# Patient Record
Sex: Male | Born: 1945
Health system: Southern US, Community
[De-identification: ages and names within clinical notes are randomized; demographics above are authoritative.]

## PROBLEM LIST (undated history)

## (undated) DIAGNOSIS — I714 Abdominal aortic aneurysm, without rupture, unspecified: Secondary | ICD-10-CM

## (undated) DIAGNOSIS — I10 Essential (primary) hypertension: Secondary | ICD-10-CM

## (undated) DIAGNOSIS — J449 Chronic obstructive pulmonary disease, unspecified: Secondary | ICD-10-CM

## (undated) DIAGNOSIS — I639 Cerebral infarction, unspecified: Secondary | ICD-10-CM

## (undated) DIAGNOSIS — R42 Dizziness and giddiness: Secondary | ICD-10-CM

## (undated) DIAGNOSIS — G20A1 Parkinson's disease without dyskinesia, without mention of fluctuations: Secondary | ICD-10-CM

## (undated) DIAGNOSIS — IMO0001 Reserved for inherently not codable concepts without codable children: Secondary | ICD-10-CM

## (undated) DIAGNOSIS — C61 Malignant neoplasm of prostate: Secondary | ICD-10-CM

## (undated) DIAGNOSIS — C801 Malignant (primary) neoplasm, unspecified: Secondary | ICD-10-CM

## (undated) DIAGNOSIS — S4291XA Fracture of right shoulder girdle, part unspecified, initial encounter for closed fracture: Secondary | ICD-10-CM

## (undated) DIAGNOSIS — K219 Gastro-esophageal reflux disease without esophagitis: Secondary | ICD-10-CM

## (undated) DIAGNOSIS — F329 Major depressive disorder, single episode, unspecified: Secondary | ICD-10-CM

## (undated) DIAGNOSIS — I519 Heart disease, unspecified: Secondary | ICD-10-CM

## (undated) DIAGNOSIS — F32A Depression, unspecified: Secondary | ICD-10-CM

## (undated) DIAGNOSIS — R079 Chest pain, unspecified: Secondary | ICD-10-CM

## (undated) DIAGNOSIS — I82419 Acute embolism and thrombosis of unspecified femoral vein: Secondary | ICD-10-CM

## (undated) DIAGNOSIS — E785 Hyperlipidemia, unspecified: Secondary | ICD-10-CM

## (undated) DIAGNOSIS — G20 Parkinson's disease: Secondary | ICD-10-CM

## (undated) HISTORY — DX: Chronic obstructive pulmonary disease, unspecified: J44.9

## (undated) HISTORY — DX: Parkinson's disease without dyskinesia, without mention of fluctuations: G20.A1

## (undated) HISTORY — DX: Chest pain, unspecified: R07.9

## (undated) HISTORY — DX: Parkinson's disease: G20

## (undated) HISTORY — DX: Essential (primary) hypertension: I10

## (undated) HISTORY — DX: Abdominal aortic aneurysm, without rupture, unspecified: I71.40

## (undated) HISTORY — DX: Fracture of right shoulder girdle, part unspecified, initial encounter for closed fracture: S42.91XA

## (undated) HISTORY — PX: FRACTURE SURGERY: SHX138

## (undated) HISTORY — DX: Heart disease, unspecified: I51.9

## (undated) HISTORY — DX: Hyperlipidemia, unspecified: E78.5

## (undated) HISTORY — DX: Abdominal aortic aneurysm, without rupture: I71.4

## (undated) HISTORY — PX: INNER EAR SURGERY: SHX679

## (undated) HISTORY — PX: MANDIBLE SURGERY: SHX707

## (undated) SURGERY — Surgical Case
Anesthesia: *Unknown

---

## 2006-05-02 ENCOUNTER — Ambulatory Visit: Payer: Self-pay | Admitting: Otolaryngology

## 2006-07-05 ENCOUNTER — Other Ambulatory Visit: Payer: Self-pay

## 2006-07-13 ENCOUNTER — Ambulatory Visit: Payer: Self-pay | Admitting: Otolaryngology

## 2006-12-05 ENCOUNTER — Ambulatory Visit: Payer: Self-pay | Admitting: Unknown Physician Specialty

## 2008-08-02 HISTORY — PX: COLONOSCOPY: SHX5424

## 2008-08-06 ENCOUNTER — Ambulatory Visit: Payer: Self-pay | Admitting: Gastroenterology

## 2010-05-25 ENCOUNTER — Ambulatory Visit: Payer: Self-pay | Admitting: Family Medicine

## 2010-12-25 ENCOUNTER — Ambulatory Visit: Payer: Self-pay | Admitting: Otolaryngology

## 2011-02-09 ENCOUNTER — Ambulatory Visit: Payer: Self-pay | Admitting: Otolaryngology

## 2011-02-15 ENCOUNTER — Ambulatory Visit: Payer: Self-pay | Admitting: Otolaryngology

## 2011-02-16 LAB — PATHOLOGY REPORT

## 2011-08-02 ENCOUNTER — Ambulatory Visit: Payer: Self-pay | Admitting: Family Medicine

## 2012-01-19 DIAGNOSIS — H40009 Preglaucoma, unspecified, unspecified eye: Secondary | ICD-10-CM | POA: Diagnosis not present

## 2012-02-09 DIAGNOSIS — K219 Gastro-esophageal reflux disease without esophagitis: Secondary | ICD-10-CM | POA: Diagnosis not present

## 2012-02-09 DIAGNOSIS — I10 Essential (primary) hypertension: Secondary | ICD-10-CM | POA: Diagnosis not present

## 2012-02-09 DIAGNOSIS — J449 Chronic obstructive pulmonary disease, unspecified: Secondary | ICD-10-CM | POA: Diagnosis not present

## 2012-02-09 DIAGNOSIS — E785 Hyperlipidemia, unspecified: Secondary | ICD-10-CM | POA: Diagnosis not present

## 2012-02-09 DIAGNOSIS — H40009 Preglaucoma, unspecified, unspecified eye: Secondary | ICD-10-CM | POA: Diagnosis not present

## 2012-03-10 DIAGNOSIS — H4010X0 Unspecified open-angle glaucoma, stage unspecified: Secondary | ICD-10-CM | POA: Diagnosis not present

## 2012-03-14 DIAGNOSIS — E785 Hyperlipidemia, unspecified: Secondary | ICD-10-CM | POA: Diagnosis not present

## 2012-07-12 DIAGNOSIS — H6120 Impacted cerumen, unspecified ear: Secondary | ICD-10-CM | POA: Diagnosis not present

## 2012-07-12 DIAGNOSIS — H60509 Unspecified acute noninfective otitis externa, unspecified ear: Secondary | ICD-10-CM | POA: Diagnosis not present

## 2012-07-12 DIAGNOSIS — H902 Conductive hearing loss, unspecified: Secondary | ICD-10-CM | POA: Diagnosis not present

## 2012-07-12 DIAGNOSIS — H906 Mixed conductive and sensorineural hearing loss, bilateral: Secondary | ICD-10-CM | POA: Diagnosis not present

## 2012-07-12 DIAGNOSIS — H6980 Other specified disorders of Eustachian tube, unspecified ear: Secondary | ICD-10-CM | POA: Diagnosis not present

## 2012-08-07 DIAGNOSIS — H6980 Other specified disorders of Eustachian tube, unspecified ear: Secondary | ICD-10-CM | POA: Diagnosis not present

## 2012-08-07 DIAGNOSIS — H908 Mixed conductive and sensorineural hearing loss, unspecified: Secondary | ICD-10-CM | POA: Diagnosis not present

## 2012-08-07 DIAGNOSIS — H905 Unspecified sensorineural hearing loss: Secondary | ICD-10-CM | POA: Diagnosis not present

## 2012-09-18 DIAGNOSIS — H6120 Impacted cerumen, unspecified ear: Secondary | ICD-10-CM | POA: Diagnosis not present

## 2012-09-18 DIAGNOSIS — H6980 Other specified disorders of Eustachian tube, unspecified ear: Secondary | ICD-10-CM | POA: Diagnosis not present

## 2012-09-18 DIAGNOSIS — H908 Mixed conductive and sensorineural hearing loss, unspecified: Secondary | ICD-10-CM | POA: Diagnosis not present

## 2012-09-25 DIAGNOSIS — H353190 Nonexudative age-related macular degeneration, unspecified eye, stage unspecified: Secondary | ICD-10-CM | POA: Diagnosis not present

## 2012-10-13 DIAGNOSIS — E785 Hyperlipidemia, unspecified: Secondary | ICD-10-CM | POA: Diagnosis not present

## 2012-10-13 DIAGNOSIS — K219 Gastro-esophageal reflux disease without esophagitis: Secondary | ICD-10-CM | POA: Diagnosis not present

## 2012-10-13 DIAGNOSIS — I10 Essential (primary) hypertension: Secondary | ICD-10-CM | POA: Diagnosis not present

## 2013-03-26 DIAGNOSIS — H4010X0 Unspecified open-angle glaucoma, stage unspecified: Secondary | ICD-10-CM | POA: Diagnosis not present

## 2013-05-11 DIAGNOSIS — J449 Chronic obstructive pulmonary disease, unspecified: Secondary | ICD-10-CM | POA: Diagnosis not present

## 2013-05-11 DIAGNOSIS — Z23 Encounter for immunization: Secondary | ICD-10-CM | POA: Diagnosis not present

## 2013-05-11 DIAGNOSIS — K219 Gastro-esophageal reflux disease without esophagitis: Secondary | ICD-10-CM | POA: Diagnosis not present

## 2013-05-11 DIAGNOSIS — E785 Hyperlipidemia, unspecified: Secondary | ICD-10-CM | POA: Diagnosis not present

## 2013-05-11 DIAGNOSIS — I10 Essential (primary) hypertension: Secondary | ICD-10-CM | POA: Diagnosis not present

## 2013-09-24 DIAGNOSIS — H353190 Nonexudative age-related macular degeneration, unspecified eye, stage unspecified: Secondary | ICD-10-CM | POA: Diagnosis not present

## 2013-10-08 DIAGNOSIS — J329 Chronic sinusitis, unspecified: Secondary | ICD-10-CM | POA: Diagnosis not present

## 2013-10-08 DIAGNOSIS — R32 Unspecified urinary incontinence: Secondary | ICD-10-CM | POA: Diagnosis not present

## 2013-10-08 DIAGNOSIS — J449 Chronic obstructive pulmonary disease, unspecified: Secondary | ICD-10-CM | POA: Diagnosis not present

## 2013-10-10 ENCOUNTER — Emergency Department: Payer: Self-pay | Admitting: Emergency Medicine

## 2013-10-10 DIAGNOSIS — Z79899 Other long term (current) drug therapy: Secondary | ICD-10-CM | POA: Diagnosis not present

## 2013-10-10 DIAGNOSIS — J189 Pneumonia, unspecified organism: Secondary | ICD-10-CM | POA: Diagnosis not present

## 2013-10-10 DIAGNOSIS — F17200 Nicotine dependence, unspecified, uncomplicated: Secondary | ICD-10-CM | POA: Diagnosis not present

## 2013-10-10 DIAGNOSIS — J40 Bronchitis, not specified as acute or chronic: Secondary | ICD-10-CM | POA: Diagnosis not present

## 2013-10-10 DIAGNOSIS — J209 Acute bronchitis, unspecified: Secondary | ICD-10-CM | POA: Diagnosis not present

## 2013-10-10 DIAGNOSIS — J449 Chronic obstructive pulmonary disease, unspecified: Secondary | ICD-10-CM | POA: Diagnosis not present

## 2013-10-10 DIAGNOSIS — E860 Dehydration: Secondary | ICD-10-CM | POA: Diagnosis not present

## 2013-10-10 DIAGNOSIS — J984 Other disorders of lung: Secondary | ICD-10-CM | POA: Diagnosis not present

## 2013-10-10 DIAGNOSIS — I10 Essential (primary) hypertension: Secondary | ICD-10-CM | POA: Diagnosis not present

## 2013-10-10 DIAGNOSIS — R0602 Shortness of breath: Secondary | ICD-10-CM | POA: Diagnosis not present

## 2013-10-10 DIAGNOSIS — E785 Hyperlipidemia, unspecified: Secondary | ICD-10-CM | POA: Diagnosis not present

## 2013-10-10 LAB — CBC
HCT: 47.4 % (ref 40.0–52.0)
HGB: 16 g/dL (ref 13.0–18.0)
MCH: 30.6 pg (ref 26.0–34.0)
MCHC: 33.6 g/dL (ref 32.0–36.0)
MCV: 91 fL (ref 80–100)
Platelet: 150 10*3/uL (ref 150–440)
RBC: 5.22 10*6/uL (ref 4.40–5.90)
RDW: 14.4 % (ref 11.5–14.5)
WBC: 4.4 10*3/uL (ref 3.8–10.6)

## 2013-10-10 LAB — BASIC METABOLIC PANEL
Anion Gap: 4 — ABNORMAL LOW (ref 7–16)
BUN: 27 mg/dL — ABNORMAL HIGH (ref 7–18)
Calcium, Total: 9.3 mg/dL (ref 8.5–10.1)
Chloride: 100 mmol/L (ref 98–107)
Co2: 29 mmol/L (ref 21–32)
Creatinine: 1.23 mg/dL (ref 0.60–1.30)
EGFR (African American): >60
EGFR (Non-African Amer.): 60 — ABNORMAL LOW
Glucose: 99 mg/dL (ref 65–99)
Osmolality: 272 (ref 275–301)
Potassium: 4.2 mmol/L (ref 3.5–5.1)
Sodium: 133 mmol/L — ABNORMAL LOW (ref 136–145)

## 2013-10-10 LAB — TROPONIN I: Troponin-I: <0.02 ng/mL

## 2013-10-12 DIAGNOSIS — J189 Pneumonia, unspecified organism: Secondary | ICD-10-CM | POA: Diagnosis not present

## 2013-10-12 DIAGNOSIS — J449 Chronic obstructive pulmonary disease, unspecified: Secondary | ICD-10-CM | POA: Diagnosis not present

## 2013-10-12 DIAGNOSIS — E860 Dehydration: Secondary | ICD-10-CM | POA: Diagnosis not present

## 2013-10-17 ENCOUNTER — Observation Stay: Payer: Self-pay | Admitting: Specialist

## 2013-10-17 DIAGNOSIS — Z800 Family history of malignant neoplasm of digestive organs: Secondary | ICD-10-CM | POA: Diagnosis not present

## 2013-10-17 DIAGNOSIS — J398 Other specified diseases of upper respiratory tract: Secondary | ICD-10-CM | POA: Diagnosis not present

## 2013-10-17 DIAGNOSIS — J440 Chronic obstructive pulmonary disease with acute lower respiratory infection: Secondary | ICD-10-CM | POA: Diagnosis not present

## 2013-10-17 DIAGNOSIS — I10 Essential (primary) hypertension: Secondary | ICD-10-CM | POA: Diagnosis not present

## 2013-10-17 DIAGNOSIS — Z8051 Family history of malignant neoplasm of kidney: Secondary | ICD-10-CM | POA: Diagnosis not present

## 2013-10-17 DIAGNOSIS — J441 Chronic obstructive pulmonary disease with (acute) exacerbation: Secondary | ICD-10-CM | POA: Diagnosis not present

## 2013-10-17 DIAGNOSIS — F17200 Nicotine dependence, unspecified, uncomplicated: Secondary | ICD-10-CM | POA: Diagnosis not present

## 2013-10-17 DIAGNOSIS — R062 Wheezing: Secondary | ICD-10-CM | POA: Diagnosis not present

## 2013-10-17 DIAGNOSIS — K219 Gastro-esophageal reflux disease without esophagitis: Secondary | ICD-10-CM | POA: Diagnosis not present

## 2013-10-17 DIAGNOSIS — E785 Hyperlipidemia, unspecified: Secondary | ICD-10-CM | POA: Diagnosis not present

## 2013-10-17 DIAGNOSIS — H409 Unspecified glaucoma: Secondary | ICD-10-CM | POA: Diagnosis not present

## 2013-10-17 DIAGNOSIS — R0602 Shortness of breath: Secondary | ICD-10-CM | POA: Diagnosis not present

## 2013-10-17 DIAGNOSIS — I517 Cardiomegaly: Secondary | ICD-10-CM | POA: Diagnosis not present

## 2013-10-17 DIAGNOSIS — J9600 Acute respiratory failure, unspecified whether with hypoxia or hypercapnia: Secondary | ICD-10-CM | POA: Diagnosis not present

## 2013-10-17 DIAGNOSIS — H9190 Unspecified hearing loss, unspecified ear: Secondary | ICD-10-CM | POA: Diagnosis not present

## 2013-10-17 DIAGNOSIS — Z79899 Other long term (current) drug therapy: Secondary | ICD-10-CM | POA: Diagnosis not present

## 2013-10-17 DIAGNOSIS — J988 Other specified respiratory disorders: Secondary | ICD-10-CM | POA: Diagnosis not present

## 2013-10-17 LAB — CBC
HCT: 50.3 % (ref 40.0–52.0)
HGB: 16.8 g/dL (ref 13.0–18.0)
MCH: 29.8 pg (ref 26.0–34.0)
MCHC: 33.3 g/dL (ref 32.0–36.0)
MCV: 90 fL (ref 80–100)
Platelet: 185 10*3/uL (ref 150–440)
RBC: 5.62 10*6/uL (ref 4.40–5.90)
RDW: 13.8 % (ref 11.5–14.5)
WBC: 7.9 10*3/uL (ref 3.8–10.6)

## 2013-10-17 LAB — BASIC METABOLIC PANEL
Anion Gap: 3 — ABNORMAL LOW (ref 7–16)
BUN: 9 mg/dL (ref 7–18)
Calcium, Total: 8.8 mg/dL (ref 8.5–10.1)
Chloride: 103 mmol/L (ref 98–107)
Co2: 30 mmol/L (ref 21–32)
Creatinine: 0.95 mg/dL (ref 0.60–1.30)
EGFR (African American): >60
EGFR (Non-African Amer.): >60
Glucose: 102 mg/dL — ABNORMAL HIGH (ref 65–99)
Osmolality: 271 (ref 275–301)
Potassium: 4.3 mmol/L (ref 3.5–5.1)
Sodium: 136 mmol/L (ref 136–145)

## 2013-10-17 LAB — TROPONIN I: Troponin-I: <0.02 ng/mL

## 2013-10-18 DIAGNOSIS — K219 Gastro-esophageal reflux disease without esophagitis: Secondary | ICD-10-CM | POA: Diagnosis not present

## 2013-10-18 DIAGNOSIS — F17200 Nicotine dependence, unspecified, uncomplicated: Secondary | ICD-10-CM | POA: Diagnosis not present

## 2013-10-18 DIAGNOSIS — I517 Cardiomegaly: Secondary | ICD-10-CM | POA: Diagnosis not present

## 2013-10-18 DIAGNOSIS — J449 Chronic obstructive pulmonary disease, unspecified: Secondary | ICD-10-CM | POA: Diagnosis not present

## 2013-10-18 DIAGNOSIS — I10 Essential (primary) hypertension: Secondary | ICD-10-CM | POA: Diagnosis not present

## 2013-10-18 LAB — CBC WITH DIFFERENTIAL/PLATELET
Basophil #: 0 10*3/uL (ref 0.0–0.1)
Basophil %: 0.2 %
Eosinophil #: 0 10*3/uL (ref 0.0–0.7)
Eosinophil %: 0 %
HCT: 43.5 % (ref 40.0–52.0)
HGB: 14 g/dL (ref 13.0–18.0)
Lymphocyte #: 0.7 10*3/uL — ABNORMAL LOW (ref 1.0–3.6)
Lymphocyte %: 15.5 %
MCH: 28.5 pg (ref 26.0–34.0)
MCHC: 32.1 g/dL (ref 32.0–36.0)
MCV: 89 fL (ref 80–100)
Monocyte #: 0.1 x10 3/mm — ABNORMAL LOW (ref 0.2–1.0)
Monocyte %: 2.5 %
Neutrophil #: 3.9 10*3/uL (ref 1.4–6.5)
Neutrophil %: 81.8 %
Platelet: 186 10*3/uL (ref 150–440)
RBC: 4.9 10*6/uL (ref 4.40–5.90)
RDW: 14 % (ref 11.5–14.5)
WBC: 4.8 10*3/uL (ref 3.8–10.6)

## 2013-10-18 LAB — BASIC METABOLIC PANEL
Anion Gap: 5 — ABNORMAL LOW (ref 7–16)
BUN: 16 mg/dL (ref 7–18)
Calcium, Total: 8.5 mg/dL (ref 8.5–10.1)
Chloride: 107 mmol/L (ref 98–107)
Co2: 27 mmol/L (ref 21–32)
Creatinine: 0.88 mg/dL (ref 0.60–1.30)
EGFR (African American): >60
EGFR (Non-African Amer.): >60
Glucose: 159 mg/dL — ABNORMAL HIGH (ref 65–99)
Osmolality: 282 (ref 275–301)
Potassium: 4.4 mmol/L (ref 3.5–5.1)
Sodium: 139 mmol/L (ref 136–145)

## 2013-10-18 LAB — LIPID PANEL
Cholesterol: 77 mg/dL (ref 0–200)
HDL Cholesterol: 39 mg/dL — ABNORMAL LOW (ref 40–60)
Ldl Cholesterol, Calc: 26 mg/dL (ref 0–100)
Triglycerides: 59 mg/dL (ref 0–200)
VLDL Cholesterol, Calc: 12 mg/dL (ref 5–40)

## 2013-10-19 DIAGNOSIS — K219 Gastro-esophageal reflux disease without esophagitis: Secondary | ICD-10-CM | POA: Diagnosis not present

## 2013-10-19 DIAGNOSIS — F17200 Nicotine dependence, unspecified, uncomplicated: Secondary | ICD-10-CM | POA: Diagnosis not present

## 2013-10-19 DIAGNOSIS — J449 Chronic obstructive pulmonary disease, unspecified: Secondary | ICD-10-CM | POA: Diagnosis not present

## 2013-10-19 DIAGNOSIS — I10 Essential (primary) hypertension: Secondary | ICD-10-CM | POA: Diagnosis not present

## 2013-11-16 DIAGNOSIS — J189 Pneumonia, unspecified organism: Secondary | ICD-10-CM | POA: Diagnosis not present

## 2013-11-16 DIAGNOSIS — J449 Chronic obstructive pulmonary disease, unspecified: Secondary | ICD-10-CM | POA: Diagnosis not present

## 2014-02-27 DIAGNOSIS — I10 Essential (primary) hypertension: Secondary | ICD-10-CM | POA: Diagnosis not present

## 2014-02-27 DIAGNOSIS — E785 Hyperlipidemia, unspecified: Secondary | ICD-10-CM | POA: Diagnosis not present

## 2014-02-27 DIAGNOSIS — K219 Gastro-esophageal reflux disease without esophagitis: Secondary | ICD-10-CM | POA: Diagnosis not present

## 2014-02-27 DIAGNOSIS — J449 Chronic obstructive pulmonary disease, unspecified: Secondary | ICD-10-CM | POA: Diagnosis not present

## 2014-03-05 DIAGNOSIS — I10 Essential (primary) hypertension: Secondary | ICD-10-CM | POA: Diagnosis not present

## 2014-03-05 DIAGNOSIS — E785 Hyperlipidemia, unspecified: Secondary | ICD-10-CM | POA: Diagnosis not present

## 2014-03-25 DIAGNOSIS — H4010X0 Unspecified open-angle glaucoma, stage unspecified: Secondary | ICD-10-CM | POA: Diagnosis not present

## 2014-05-30 DIAGNOSIS — Z23 Encounter for immunization: Secondary | ICD-10-CM | POA: Diagnosis not present

## 2014-07-27 ENCOUNTER — Emergency Department: Payer: Self-pay | Admitting: Emergency Medicine

## 2014-07-27 DIAGNOSIS — S42291A Other displaced fracture of upper end of right humerus, initial encounter for closed fracture: Secondary | ICD-10-CM | POA: Diagnosis not present

## 2014-07-27 DIAGNOSIS — R079 Chest pain, unspecified: Secondary | ICD-10-CM | POA: Diagnosis not present

## 2014-07-27 DIAGNOSIS — M25512 Pain in left shoulder: Secondary | ICD-10-CM | POA: Diagnosis not present

## 2014-07-27 DIAGNOSIS — S299XXA Unspecified injury of thorax, initial encounter: Secondary | ICD-10-CM | POA: Diagnosis not present

## 2014-07-27 DIAGNOSIS — S42214A Unspecified nondisplaced fracture of surgical neck of right humerus, initial encounter for closed fracture: Secondary | ICD-10-CM | POA: Diagnosis not present

## 2014-07-27 DIAGNOSIS — S0093XA Contusion of unspecified part of head, initial encounter: Secondary | ICD-10-CM | POA: Diagnosis not present

## 2014-07-27 DIAGNOSIS — Z87891 Personal history of nicotine dependence: Secondary | ICD-10-CM | POA: Diagnosis not present

## 2014-07-27 DIAGNOSIS — S60511A Abrasion of right hand, initial encounter: Secondary | ICD-10-CM | POA: Diagnosis not present

## 2014-07-27 DIAGNOSIS — S0990XA Unspecified injury of head, initial encounter: Secondary | ICD-10-CM | POA: Diagnosis not present

## 2014-07-27 DIAGNOSIS — Z23 Encounter for immunization: Secondary | ICD-10-CM | POA: Diagnosis not present

## 2014-07-27 DIAGNOSIS — S81811A Laceration without foreign body, right lower leg, initial encounter: Secondary | ICD-10-CM | POA: Diagnosis not present

## 2014-07-27 DIAGNOSIS — S0083XA Contusion of other part of head, initial encounter: Secondary | ICD-10-CM | POA: Diagnosis not present

## 2014-07-27 DIAGNOSIS — S40011A Contusion of right shoulder, initial encounter: Secondary | ICD-10-CM | POA: Diagnosis not present

## 2014-07-27 DIAGNOSIS — R9431 Abnormal electrocardiogram [ECG] [EKG]: Secondary | ICD-10-CM | POA: Diagnosis not present

## 2014-07-27 DIAGNOSIS — I10 Essential (primary) hypertension: Secondary | ICD-10-CM | POA: Diagnosis not present

## 2014-07-27 DIAGNOSIS — W19XXXA Unspecified fall, initial encounter: Secondary | ICD-10-CM | POA: Diagnosis not present

## 2014-07-27 DIAGNOSIS — S80811A Abrasion, right lower leg, initial encounter: Secondary | ICD-10-CM | POA: Diagnosis not present

## 2014-07-27 DIAGNOSIS — S42211A Unspecified displaced fracture of surgical neck of right humerus, initial encounter for closed fracture: Secondary | ICD-10-CM | POA: Diagnosis not present

## 2014-07-27 LAB — BASIC METABOLIC PANEL
Anion Gap: 7 (ref 7–16)
BUN: 14 mg/dL (ref 7–18)
Calcium, Total: 8.6 mg/dL (ref 8.5–10.1)
Chloride: 106 mmol/L (ref 98–107)
Co2: 27 mmol/L (ref 21–32)
Creatinine: 1.13 mg/dL (ref 0.60–1.30)
EGFR (African American): >60
EGFR (Non-African Amer.): >60
Glucose: 137 mg/dL — ABNORMAL HIGH (ref 65–99)
Osmolality: 282 (ref 275–301)
Potassium: 4.5 mmol/L (ref 3.5–5.1)
Sodium: 140 mmol/L (ref 136–145)

## 2014-07-27 LAB — CBC
HCT: 45.9 % (ref 40.0–52.0)
HGB: 15.2 g/dL (ref 13.0–18.0)
MCH: 29.8 pg (ref 26.0–34.0)
MCHC: 33.1 g/dL (ref 32.0–36.0)
MCV: 90 fL (ref 80–100)
Platelet: 249 10*3/uL (ref 150–440)
RBC: 5.09 10*6/uL (ref 4.40–5.90)
RDW: 13.8 % (ref 11.5–14.5)
WBC: 9 10*3/uL (ref 3.8–10.6)

## 2014-07-30 DIAGNOSIS — S42201D Unspecified fracture of upper end of right humerus, subsequent encounter for fracture with routine healing: Secondary | ICD-10-CM | POA: Diagnosis not present

## 2014-07-30 DIAGNOSIS — I719 Aortic aneurysm of unspecified site, without rupture: Secondary | ICD-10-CM | POA: Diagnosis not present

## 2014-07-30 DIAGNOSIS — S3991XA Unspecified injury of abdomen, initial encounter: Secondary | ICD-10-CM | POA: Diagnosis not present

## 2014-07-30 DIAGNOSIS — M79601 Pain in right arm: Secondary | ICD-10-CM | POA: Diagnosis not present

## 2014-07-30 DIAGNOSIS — S79911A Unspecified injury of right hip, initial encounter: Secondary | ICD-10-CM | POA: Diagnosis not present

## 2014-07-30 DIAGNOSIS — M25551 Pain in right hip: Secondary | ICD-10-CM | POA: Diagnosis not present

## 2014-07-30 LAB — URINALYSIS, COMPLETE
Bacteria: NONE SEEN
Bilirubin,UR: NEGATIVE
Blood: NEGATIVE
Glucose,UR: NEGATIVE mg/dL (ref 0–75)
Leukocyte Esterase: NEGATIVE
Nitrite: NEGATIVE
Ph: 5 (ref 4.5–8.0)
Protein: NEGATIVE
RBC,UR: 2 /HPF (ref 0–5)
Specific Gravity: 1.058 (ref 1.003–1.030)
Squamous Epithelial: NONE SEEN
WBC UR: 1 /HPF (ref 0–5)

## 2014-07-30 LAB — CBC
HCT: 43 % (ref 40.0–52.0)
HGB: 14.3 g/dL (ref 13.0–18.0)
MCH: 29.9 pg (ref 26.0–34.0)
MCHC: 33.2 g/dL (ref 32.0–36.0)
MCV: 90 fL (ref 80–100)
Platelet: 220 10*3/uL (ref 150–440)
RBC: 4.79 10*6/uL (ref 4.40–5.90)
RDW: 13.9 % (ref 11.5–14.5)
WBC: 10.7 10*3/uL — ABNORMAL HIGH (ref 3.8–10.6)

## 2014-07-30 LAB — LIPASE, BLOOD: Lipase: 99 U/L (ref 73–393)

## 2014-07-30 LAB — COMPREHENSIVE METABOLIC PANEL
Albumin: 3.3 g/dL — ABNORMAL LOW (ref 3.4–5.0)
Alkaline Phosphatase: 49 U/L
Anion Gap: 7 (ref 7–16)
BUN: 19 mg/dL — ABNORMAL HIGH (ref 7–18)
Bilirubin,Total: 0.9 mg/dL (ref 0.2–1.0)
Calcium, Total: 8.7 mg/dL (ref 8.5–10.1)
Chloride: 103 mmol/L (ref 98–107)
Co2: 27 mmol/L (ref 21–32)
Creatinine: 1.03 mg/dL (ref 0.60–1.30)
EGFR (African American): >60
EGFR (Non-African Amer.): >60
Glucose: 147 mg/dL — ABNORMAL HIGH (ref 65–99)
Osmolality: 279 (ref 275–301)
Potassium: 5 mmol/L (ref 3.5–5.1)
SGOT(AST): 26 U/L (ref 15–37)
SGPT (ALT): 22 U/L
Sodium: 137 mmol/L (ref 136–145)
Total Protein: 7.1 g/dL (ref 6.4–8.2)

## 2014-07-30 LAB — CK: CK, Total: 625 U/L — ABNORMAL HIGH (ref 39–308)

## 2014-07-31 DIAGNOSIS — S42251A Displaced fracture of greater tuberosity of right humerus, initial encounter for closed fracture: Secondary | ICD-10-CM | POA: Diagnosis not present

## 2014-07-31 DIAGNOSIS — S42224D 2-part nondisplaced fracture of surgical neck of right humerus, subsequent encounter for fracture with routine healing: Secondary | ICD-10-CM | POA: Diagnosis not present

## 2014-07-31 DIAGNOSIS — J449 Chronic obstructive pulmonary disease, unspecified: Secondary | ICD-10-CM | POA: Diagnosis not present

## 2014-07-31 DIAGNOSIS — R52 Pain, unspecified: Secondary | ICD-10-CM | POA: Diagnosis not present

## 2014-07-31 DIAGNOSIS — K219 Gastro-esophageal reflux disease without esophagitis: Secondary | ICD-10-CM | POA: Diagnosis not present

## 2014-07-31 DIAGNOSIS — I10 Essential (primary) hypertension: Secondary | ICD-10-CM | POA: Diagnosis not present

## 2014-07-31 DIAGNOSIS — I714 Abdominal aortic aneurysm, without rupture: Secondary | ICD-10-CM | POA: Diagnosis not present

## 2014-07-31 DIAGNOSIS — S42224A 2-part nondisplaced fracture of surgical neck of right humerus, initial encounter for closed fracture: Secondary | ICD-10-CM | POA: Diagnosis not present

## 2014-08-01 DIAGNOSIS — I10 Essential (primary) hypertension: Secondary | ICD-10-CM | POA: Diagnosis not present

## 2014-08-01 DIAGNOSIS — R52 Pain, unspecified: Secondary | ICD-10-CM | POA: Diagnosis not present

## 2014-08-01 DIAGNOSIS — S42224D 2-part nondisplaced fracture of surgical neck of right humerus, subsequent encounter for fracture with routine healing: Secondary | ICD-10-CM | POA: Diagnosis not present

## 2014-08-01 DIAGNOSIS — J449 Chronic obstructive pulmonary disease, unspecified: Secondary | ICD-10-CM | POA: Diagnosis not present

## 2014-08-01 LAB — CBC WITH DIFFERENTIAL/PLATELET
Basophil #: 0 10*3/uL (ref 0.0–0.1)
Basophil %: 0.3 %
Eosinophil #: 0.1 10*3/uL (ref 0.0–0.7)
Eosinophil %: 1.8 %
HCT: 38 % — ABNORMAL LOW (ref 40.0–52.0)
HGB: 12.7 g/dL — ABNORMAL LOW (ref 13.0–18.0)
Lymphocyte #: 1.2 10*3/uL (ref 1.0–3.6)
Lymphocyte %: 15.8 %
MCH: 30.1 pg (ref 26.0–34.0)
MCHC: 33.5 g/dL (ref 32.0–36.0)
MCV: 90 fL (ref 80–100)
Monocyte #: 1 x10 3/mm (ref 0.2–1.0)
Monocyte %: 13 %
Neutrophil #: 5.5 10*3/uL (ref 1.4–6.5)
Neutrophil %: 69.1 %
Platelet: 210 10*3/uL (ref 150–440)
RBC: 4.24 10*6/uL — ABNORMAL LOW (ref 4.40–5.90)
RDW: 13.8 % (ref 11.5–14.5)
WBC: 7.9 10*3/uL (ref 3.8–10.6)

## 2014-08-01 LAB — CK: CK, Total: 606 U/L — ABNORMAL HIGH (ref 39–308)

## 2014-08-02 ENCOUNTER — Inpatient Hospital Stay: Payer: Self-pay | Admitting: Internal Medicine

## 2014-08-02 DIAGNOSIS — S2231XA Fracture of one rib, right side, initial encounter for closed fracture: Secondary | ICD-10-CM | POA: Diagnosis not present

## 2014-08-02 DIAGNOSIS — J449 Chronic obstructive pulmonary disease, unspecified: Secondary | ICD-10-CM | POA: Diagnosis not present

## 2014-08-02 DIAGNOSIS — I714 Abdominal aortic aneurysm, without rupture: Secondary | ICD-10-CM | POA: Diagnosis present

## 2014-08-02 DIAGNOSIS — Z87891 Personal history of nicotine dependence: Secondary | ICD-10-CM | POA: Diagnosis not present

## 2014-08-02 DIAGNOSIS — K219 Gastro-esophageal reflux disease without esophagitis: Secondary | ICD-10-CM | POA: Diagnosis present

## 2014-08-02 DIAGNOSIS — S2241XD Multiple fractures of ribs, right side, subsequent encounter for fracture with routine healing: Secondary | ICD-10-CM | POA: Diagnosis not present

## 2014-08-02 DIAGNOSIS — Z8673 Personal history of transient ischemic attack (TIA), and cerebral infarction without residual deficits: Secondary | ICD-10-CM | POA: Diagnosis not present

## 2014-08-02 DIAGNOSIS — S42224D 2-part nondisplaced fracture of surgical neck of right humerus, subsequent encounter for fracture with routine healing: Secondary | ICD-10-CM | POA: Diagnosis not present

## 2014-08-02 DIAGNOSIS — D72829 Elevated white blood cell count, unspecified: Secondary | ICD-10-CM | POA: Diagnosis present

## 2014-08-02 DIAGNOSIS — H409 Unspecified glaucoma: Secondary | ICD-10-CM | POA: Diagnosis not present

## 2014-08-02 DIAGNOSIS — G459 Transient cerebral ischemic attack, unspecified: Secondary | ICD-10-CM | POA: Diagnosis not present

## 2014-08-02 DIAGNOSIS — M6282 Rhabdomyolysis: Secondary | ICD-10-CM | POA: Diagnosis present

## 2014-08-02 DIAGNOSIS — Z4689 Encounter for fitting and adjustment of other specified devices: Secondary | ICD-10-CM | POA: Diagnosis not present

## 2014-08-02 DIAGNOSIS — R0902 Hypoxemia: Secondary | ICD-10-CM | POA: Diagnosis present

## 2014-08-02 DIAGNOSIS — I10 Essential (primary) hypertension: Secondary | ICD-10-CM | POA: Diagnosis not present

## 2014-08-02 DIAGNOSIS — L719 Rosacea, unspecified: Secondary | ICD-10-CM | POA: Diagnosis not present

## 2014-08-02 DIAGNOSIS — S42211D Unspecified displaced fracture of surgical neck of right humerus, subsequent encounter for fracture with routine healing: Secondary | ICD-10-CM | POA: Diagnosis not present

## 2014-08-02 DIAGNOSIS — Z6841 Body Mass Index (BMI) 40.0 and over, adult: Secondary | ICD-10-CM | POA: Diagnosis not present

## 2014-08-02 DIAGNOSIS — E669 Obesity, unspecified: Secondary | ICD-10-CM | POA: Diagnosis present

## 2014-08-02 DIAGNOSIS — S42201A Unspecified fracture of upper end of right humerus, initial encounter for closed fracture: Secondary | ICD-10-CM | POA: Diagnosis present

## 2014-08-02 DIAGNOSIS — R52 Pain, unspecified: Secondary | ICD-10-CM | POA: Diagnosis not present

## 2014-08-02 DIAGNOSIS — E785 Hyperlipidemia, unspecified: Secondary | ICD-10-CM | POA: Diagnosis present

## 2014-08-02 DIAGNOSIS — Z9181 History of falling: Secondary | ICD-10-CM | POA: Diagnosis not present

## 2014-08-02 LAB — BASIC METABOLIC PANEL
Anion Gap: 8 (ref 7–16)
BUN: 16 mg/dL (ref 7–18)
Calcium, Total: 7.9 mg/dL — ABNORMAL LOW (ref 8.5–10.1)
Chloride: 106 mmol/L (ref 98–107)
Co2: 25 mmol/L (ref 21–32)
Creatinine: 0.89 mg/dL (ref 0.60–1.30)
EGFR (African American): >60
EGFR (Non-African Amer.): >60
Glucose: 175 mg/dL — ABNORMAL HIGH (ref 65–99)
Osmolality: 283 (ref 275–301)
Potassium: 4.2 mmol/L (ref 3.5–5.1)
Sodium: 139 mmol/L (ref 136–145)

## 2014-08-02 LAB — CK: CK, Total: 896 U/L — ABNORMAL HIGH (ref 39–308)

## 2014-08-03 DIAGNOSIS — I10 Essential (primary) hypertension: Secondary | ICD-10-CM | POA: Diagnosis not present

## 2014-08-03 DIAGNOSIS — J449 Chronic obstructive pulmonary disease, unspecified: Secondary | ICD-10-CM | POA: Diagnosis not present

## 2014-08-03 DIAGNOSIS — R52 Pain, unspecified: Secondary | ICD-10-CM | POA: Diagnosis not present

## 2014-08-03 DIAGNOSIS — S42224D 2-part nondisplaced fracture of surgical neck of right humerus, subsequent encounter for fracture with routine healing: Secondary | ICD-10-CM | POA: Diagnosis not present

## 2014-08-03 LAB — CREATININE, SERUM
Creatinine: 0.74 mg/dL (ref 0.60–1.30)
EGFR (African American): >60
EGFR (Non-African Amer.): >60

## 2014-08-04 DIAGNOSIS — J449 Chronic obstructive pulmonary disease, unspecified: Secondary | ICD-10-CM | POA: Diagnosis not present

## 2014-08-04 DIAGNOSIS — S42224D 2-part nondisplaced fracture of surgical neck of right humerus, subsequent encounter for fracture with routine healing: Secondary | ICD-10-CM | POA: Diagnosis not present

## 2014-08-04 DIAGNOSIS — I10 Essential (primary) hypertension: Secondary | ICD-10-CM | POA: Diagnosis not present

## 2014-08-04 DIAGNOSIS — R52 Pain, unspecified: Secondary | ICD-10-CM | POA: Diagnosis not present

## 2014-08-04 LAB — CK: CK, Total: 217 U/L (ref 39–308)

## 2014-08-05 DIAGNOSIS — E785 Hyperlipidemia, unspecified: Secondary | ICD-10-CM | POA: Diagnosis not present

## 2014-08-05 DIAGNOSIS — S42224D 2-part nondisplaced fracture of surgical neck of right humerus, subsequent encounter for fracture with routine healing: Secondary | ICD-10-CM | POA: Diagnosis not present

## 2014-08-05 DIAGNOSIS — E669 Obesity, unspecified: Secondary | ICD-10-CM | POA: Diagnosis not present

## 2014-08-05 DIAGNOSIS — S42221D 2-part displaced fracture of surgical neck of right humerus, subsequent encounter for fracture with routine healing: Secondary | ICD-10-CM | POA: Diagnosis not present

## 2014-08-05 DIAGNOSIS — M6282 Rhabdomyolysis: Secondary | ICD-10-CM | POA: Diagnosis not present

## 2014-08-05 DIAGNOSIS — S42211D Unspecified displaced fracture of surgical neck of right humerus, subsequent encounter for fracture with routine healing: Secondary | ICD-10-CM | POA: Diagnosis not present

## 2014-08-05 DIAGNOSIS — R52 Pain, unspecified: Secondary | ICD-10-CM | POA: Diagnosis not present

## 2014-08-05 DIAGNOSIS — Z8673 Personal history of transient ischemic attack (TIA), and cerebral infarction without residual deficits: Secondary | ICD-10-CM | POA: Diagnosis not present

## 2014-08-05 DIAGNOSIS — S2241XD Multiple fractures of ribs, right side, subsequent encounter for fracture with routine healing: Secondary | ICD-10-CM | POA: Diagnosis not present

## 2014-08-05 DIAGNOSIS — Z4689 Encounter for fitting and adjustment of other specified devices: Secondary | ICD-10-CM | POA: Diagnosis not present

## 2014-08-05 DIAGNOSIS — Z9181 History of falling: Secondary | ICD-10-CM | POA: Diagnosis not present

## 2014-08-05 DIAGNOSIS — L719 Rosacea, unspecified: Secondary | ICD-10-CM | POA: Diagnosis not present

## 2014-08-05 DIAGNOSIS — I714 Abdominal aortic aneurysm, without rupture: Secondary | ICD-10-CM | POA: Diagnosis not present

## 2014-08-05 DIAGNOSIS — I10 Essential (primary) hypertension: Secondary | ICD-10-CM | POA: Diagnosis not present

## 2014-08-05 DIAGNOSIS — H409 Unspecified glaucoma: Secondary | ICD-10-CM | POA: Diagnosis not present

## 2014-08-05 DIAGNOSIS — K219 Gastro-esophageal reflux disease without esophagitis: Secondary | ICD-10-CM | POA: Diagnosis not present

## 2014-08-05 DIAGNOSIS — J449 Chronic obstructive pulmonary disease, unspecified: Secondary | ICD-10-CM | POA: Diagnosis not present

## 2014-08-19 DIAGNOSIS — E785 Hyperlipidemia, unspecified: Secondary | ICD-10-CM | POA: Diagnosis not present

## 2014-08-19 DIAGNOSIS — J449 Chronic obstructive pulmonary disease, unspecified: Secondary | ICD-10-CM | POA: Diagnosis not present

## 2014-08-19 DIAGNOSIS — I10 Essential (primary) hypertension: Secondary | ICD-10-CM | POA: Diagnosis not present

## 2014-08-19 DIAGNOSIS — I714 Abdominal aortic aneurysm, without rupture: Secondary | ICD-10-CM | POA: Diagnosis not present

## 2014-08-19 DIAGNOSIS — E669 Obesity, unspecified: Secondary | ICD-10-CM | POA: Diagnosis not present

## 2014-08-20 DIAGNOSIS — S42221D 2-part displaced fracture of surgical neck of right humerus, subsequent encounter for fracture with routine healing: Secondary | ICD-10-CM | POA: Diagnosis not present

## 2014-08-30 DIAGNOSIS — Z8673 Personal history of transient ischemic attack (TIA), and cerebral infarction without residual deficits: Secondary | ICD-10-CM | POA: Insufficient documentation

## 2014-08-30 DIAGNOSIS — I714 Abdominal aortic aneurysm, without rupture, unspecified: Secondary | ICD-10-CM | POA: Insufficient documentation

## 2014-08-30 DIAGNOSIS — J449 Chronic obstructive pulmonary disease, unspecified: Secondary | ICD-10-CM | POA: Insufficient documentation

## 2014-08-30 DIAGNOSIS — E782 Mixed hyperlipidemia: Secondary | ICD-10-CM | POA: Diagnosis not present

## 2014-08-30 DIAGNOSIS — I10 Essential (primary) hypertension: Secondary | ICD-10-CM | POA: Diagnosis not present

## 2014-08-30 DIAGNOSIS — R0602 Shortness of breath: Secondary | ICD-10-CM | POA: Diagnosis not present

## 2014-09-03 DIAGNOSIS — S42221D 2-part displaced fracture of surgical neck of right humerus, subsequent encounter for fracture with routine healing: Secondary | ICD-10-CM | POA: Diagnosis not present

## 2014-09-05 DIAGNOSIS — L821 Other seborrheic keratosis: Secondary | ICD-10-CM | POA: Diagnosis not present

## 2014-09-05 DIAGNOSIS — I679 Cerebrovascular disease, unspecified: Secondary | ICD-10-CM | POA: Diagnosis not present

## 2014-09-05 DIAGNOSIS — I714 Abdominal aortic aneurysm, without rupture: Secondary | ICD-10-CM | POA: Diagnosis not present

## 2014-09-05 DIAGNOSIS — Z09 Encounter for follow-up examination after completed treatment for conditions other than malignant neoplasm: Secondary | ICD-10-CM | POA: Diagnosis not present

## 2014-09-11 DIAGNOSIS — M25511 Pain in right shoulder: Secondary | ICD-10-CM | POA: Diagnosis not present

## 2014-09-11 DIAGNOSIS — M25611 Stiffness of right shoulder, not elsewhere classified: Secondary | ICD-10-CM | POA: Diagnosis not present

## 2014-09-13 DIAGNOSIS — M25611 Stiffness of right shoulder, not elsewhere classified: Secondary | ICD-10-CM | POA: Diagnosis not present

## 2014-09-13 DIAGNOSIS — M25511 Pain in right shoulder: Secondary | ICD-10-CM | POA: Diagnosis not present

## 2014-09-18 DIAGNOSIS — M25511 Pain in right shoulder: Secondary | ICD-10-CM | POA: Diagnosis not present

## 2014-09-18 DIAGNOSIS — M25611 Stiffness of right shoulder, not elsewhere classified: Secondary | ICD-10-CM | POA: Diagnosis not present

## 2014-09-20 DIAGNOSIS — M25511 Pain in right shoulder: Secondary | ICD-10-CM | POA: Diagnosis not present

## 2014-09-20 DIAGNOSIS — M25611 Stiffness of right shoulder, not elsewhere classified: Secondary | ICD-10-CM | POA: Diagnosis not present

## 2014-09-23 DIAGNOSIS — H3531 Nonexudative age-related macular degeneration: Secondary | ICD-10-CM | POA: Diagnosis not present

## 2014-09-24 DIAGNOSIS — M25511 Pain in right shoulder: Secondary | ICD-10-CM | POA: Diagnosis not present

## 2014-09-24 DIAGNOSIS — M25611 Stiffness of right shoulder, not elsewhere classified: Secondary | ICD-10-CM | POA: Diagnosis not present

## 2014-09-30 DIAGNOSIS — M25611 Stiffness of right shoulder, not elsewhere classified: Secondary | ICD-10-CM | POA: Diagnosis not present

## 2014-09-30 DIAGNOSIS — M25511 Pain in right shoulder: Secondary | ICD-10-CM | POA: Diagnosis not present

## 2014-10-02 DIAGNOSIS — M25611 Stiffness of right shoulder, not elsewhere classified: Secondary | ICD-10-CM | POA: Diagnosis not present

## 2014-10-02 DIAGNOSIS — M25511 Pain in right shoulder: Secondary | ICD-10-CM | POA: Diagnosis not present

## 2014-10-08 DIAGNOSIS — R42 Dizziness and giddiness: Secondary | ICD-10-CM | POA: Diagnosis not present

## 2014-10-10 DIAGNOSIS — M25511 Pain in right shoulder: Secondary | ICD-10-CM | POA: Diagnosis not present

## 2014-10-10 DIAGNOSIS — M25611 Stiffness of right shoulder, not elsewhere classified: Secondary | ICD-10-CM | POA: Diagnosis not present

## 2014-10-11 DIAGNOSIS — S42221D 2-part displaced fracture of surgical neck of right humerus, subsequent encounter for fracture with routine healing: Secondary | ICD-10-CM | POA: Diagnosis not present

## 2014-10-16 DIAGNOSIS — M25611 Stiffness of right shoulder, not elsewhere classified: Secondary | ICD-10-CM | POA: Diagnosis not present

## 2014-10-16 DIAGNOSIS — M25511 Pain in right shoulder: Secondary | ICD-10-CM | POA: Diagnosis not present

## 2014-10-18 DIAGNOSIS — H908 Mixed conductive and sensorineural hearing loss, unspecified: Secondary | ICD-10-CM | POA: Diagnosis not present

## 2014-10-18 DIAGNOSIS — H8112 Benign paroxysmal vertigo, left ear: Secondary | ICD-10-CM | POA: Diagnosis not present

## 2014-10-22 DIAGNOSIS — H8113 Benign paroxysmal vertigo, bilateral: Secondary | ICD-10-CM | POA: Diagnosis not present

## 2014-10-23 DIAGNOSIS — M25611 Stiffness of right shoulder, not elsewhere classified: Secondary | ICD-10-CM | POA: Diagnosis not present

## 2014-10-23 DIAGNOSIS — M25511 Pain in right shoulder: Secondary | ICD-10-CM | POA: Diagnosis not present

## 2014-10-24 DIAGNOSIS — H8111 Benign paroxysmal vertigo, right ear: Secondary | ICD-10-CM | POA: Diagnosis not present

## 2014-10-24 DIAGNOSIS — H8110 Benign paroxysmal vertigo, unspecified ear: Secondary | ICD-10-CM | POA: Diagnosis not present

## 2014-11-11 DIAGNOSIS — H8111 Benign paroxysmal vertigo, right ear: Secondary | ICD-10-CM | POA: Diagnosis not present

## 2014-11-19 DIAGNOSIS — I10 Essential (primary) hypertension: Secondary | ICD-10-CM | POA: Diagnosis not present

## 2014-11-19 DIAGNOSIS — E782 Mixed hyperlipidemia: Secondary | ICD-10-CM | POA: Diagnosis not present

## 2014-11-19 DIAGNOSIS — R0602 Shortness of breath: Secondary | ICD-10-CM | POA: Diagnosis not present

## 2014-11-19 DIAGNOSIS — I714 Abdominal aortic aneurysm, without rupture: Secondary | ICD-10-CM | POA: Diagnosis not present

## 2014-11-21 DIAGNOSIS — S42221D 2-part displaced fracture of surgical neck of right humerus, subsequent encounter for fracture with routine healing: Secondary | ICD-10-CM | POA: Diagnosis not present

## 2014-11-23 NOTE — Consult Note (Signed)
PATIENT NAME:  Howard Anderson, Howard Anderson MR#:  [id number] DATE OF BIRTH:  [birth year]  DATE OF CONSULTATION:  07/31/2014  REFERRING PHYSICIAN:   CONSULTING PHYSICIAN:  Howard Gaul, MD  REASON FOR CONSULTATION: Right proximal humerus fracture.   HISTORY OF PRESENT ILLNESS: Howard Anderson sustained a fall on 07/27/2014. He came into the hospital on that date by EMS after falling down 4 concrete stairs. He was diagnosed by x-ray with a right proximal humerus fracture. He had ecchymosis over his right forehead also but denied loss of consciousness. The patient was discharged home. He returned last night to the ER having too much pain to control at home. He states that the majority of his pain is in the right rib cage but he does have some proximal humerus pain as well.    The patient is seen in his hospital room today with his wife at the bedside.   The patient is sitting upright in bed. His right arm is in a sling. His skin is intact, but he has ecchymosis draining into the right upper arm from the proximal humerus fracture. He has intact sensation throughout the right upper extremity including over the axillary nerve distribution. He has full digital range of motion. He has intact sensation to light touch throughout the right upper extremity and a palpable radial pulse. The patient has no AC joint tenderness or step-off. He has tenderness with palpation of the proximal humerus.   RADIOLOGY: X-ray films from December 26 from the Emergency Room were reviewed. These show a minimally displaced right proximal humerus fracture through the surgical neck with extension into the humeral head. There was a slight varus deformity at the fracture. The humeral head appears located. No other fractures are identified.   ASSESSMENT: Right proximal humerus fracture, minimally displaced.   PLAN: Howard Anderson is already being treated in a sling. I recommended that he continue in the sling for this fracture. His pain will be  managed symptomatically. His wife is having difficulty managing him at home given his limited mobility and he may need rehab placement. He may follow up with me in 2 to 3 weeks in the office for repeat evaluation and x-ray. This fracture should not require surgical intervention and should go on to heal uneventfully. Physical therapy should not be performed on the right shoulder at this time to allow the fracture to begin to heal. He will start physical therapy for this injury in approximately 3 to 4 weeks.     ____________________________ Howard Gaul, MD klk:at D: 07/31/2014 12:10:22 ET T: 07/31/2014 12:23:38 ET JOB#: [id number]  cc: Howard Gaul, MD, <Dictator> Howard Gaul MD ELECTRONICALLY SIGNED 08/01/2014 10:50

## 2014-11-23 NOTE — H&P (Signed)
PATIENT NAME:  Howard Anderson, Howard Anderson MR#:  [id number] DATE OF BIRTH:  October 01, 1945  DATE OF ADMISSION:  07/31/2014  REFERRING DOCTOR: Elta Guadeloupe R. Jacqualine Code, MD  PRIMARY CARE DOCTOR: Juline Patch, MD  ADMITTING PHYSICIAN: Juluis Mire, MD   CHIEF COMPLAINTS:  1.  Unable to get out of bed secondary due to pain on the right side of the body following fall sustained 3 days ago.  2.  Patient's family unable to take care of him at home.    HISTORY OF PRESENT ILLNESS: A 69 year old Caucasian male with a past medical history significant for COPD, hyperlipidemia, gastroesophageal reflux disease, TIA, hypertension, history of fall secondary  to tripping 3 days ago status post ER evaluation and found to have right humerus fracture and was sent home on home medications, presents to the Emergency Room today with the complaints of unable to get out of the bed secondary due to pain on the right side of the body and also the patient's family able to take care of him at home. Three days ago the patient sustained a mechanical fall secondary due to tripping following which he came to the Emergency Room and had evaluation done at [facility] and was found to have a fracture of the right humerus. The patient was put in shoulder sling and  prescribed pain medication and sent home. The patient has been taking the pain medications, but because of significant pain, he is not able to get out of the bed and the patient's family not in a position to take care of him at home, hence came to the Emergency Room today. In the Emergency Room the patient was evaluated by the ED physician and underwent CT of the abdomen and the pelvis and chest because of the pain on the right side of the body, which was essentially unremarkable for any acute injury. Hence, hospitalist service was consulted for admission for pain management and placement arrangements for further care. The patient denies any fever. No shortness of breath. No chest pain. No nausea. No  vomiting. No diarrhea. No dizziness. No loss of consciousness. No focal weakness or numbness. According to the patient's wife, who is with the patient at this time, stated that she is not able to take care of him at home.    PAST MEDICAL HISTORY:  1.  COPD.  2.  Hyperlipidemia.  3.  Gastroesophageal reflux disease.  4.  TIA.  5.  Hypertension.   PAST SURGICAL HISTORY:  1.  Left eardrum patch.  2.  Left parotidectomy.   ALLERGIES: No known drug allergies.   HOME MEDICATIONS:  1.  Acetaminophen/oxycodone 325/5 mg tablet 1 tablet orally 4 times a day as needed.  2.  Advair Diskus 250/50 one puff twice a day.  3.  Atorvastatin 10 mg tablet 1 tablet orally once a day.  4.  Doxycycline 20 mg 1 capsule orally twice a day.  5.  Latanoprost ophthalmic solution 0.005% one drop in each eye at bedtime.  6.  Lovaza 1000 mg oral capsule 1 capsule 2 times a day.  7.  Ranitidine 150 mg tablet 1 tablet at bedtime.  8.  Spiriva 2.5 mcg/inhalations 1 inhalation once a day.  9.  Verapamil 180 mg extended-release tablet 1 tablet 2 times a day.   FAMILY HISTORY: Significant for brother with diabetes mellitus.   SOCIAL HISTORY: He is married. He lives at home with his wife. History of smoking and quit in March 2015. Denies any alcohol or substance abuse.  REVIEW OF SYSTEMS:  CONSTITUTIONAL: Negative for fever, chills. Generalized weakness, fatigue and pain secondary due to recent fall.  EYES: Negative for blurred vision, double vision. No pain. No redness. No inflammation.  EARS, NOSE, AND THROAT: Negative for tinnitus, ear pain, hearing loss.  RESPIRATORY: Negative for cough, wheezing, hemoptysis, painful respirations.  CARDIOVASCULAR: Negative for chest pain, orthopnea, pedal edema, palpitations, dizziness, or syncope.  GASTROINTESTINAL: Negative for nausea, vomiting, diarrhea, abdominal pain, hematemesis, melena.  GENITOURINARY: Negative for dysuria, hematuria, or frequency.  ENDOCRINE: Negative  for polyuria or nocturia. No heat or cold intolerance.  HEMATOLOGIC: Negative for anemia, easy bruising or bleeding.  INTEGUMENTARY: Ecchymosis on the right side of the face secondary due to fall and a sutured wound on the right leg secondary due to fall sustained 3 days ago.  MUSCULOSKELETAL: Pain on the right side of the body secondary due to a recent fall sustained 3 days ago.  NEUROLOGICAL: Negative for focal weakness or numbness. No CVA, TIA,  or seizure disorder.  PSYCHIATRIC: Negative for anxiety, insomnia, or depression.    PHYSICAL EXAMINATION:  VITAL SIGNS: Temperature 99.4 degrees Fahrenheit, pulse rate 98, respirations 18 per minute, blood pressure 135/82, oxygen saturation 94% on room air.  GENERAL: Well-developed, well-nourished, male, alert, awake, and oriented in no acute distress at this time comfortably resting in the bed.  HEAD: Normocephalic. He does have ecchymosis on the right side of the face surrounding the eye.  EYES: Pupils are equal, reactive to light. No conjunctival pallor. No scleral icterus. Extraocular movements intact.  NOSE: No nasal lesions. No drainage.  EARS: No drainage. No external lesions.  ORAL CAVITY: No mucosal lesions. No exudates.  NECK: Supple. No JVD. No thyromegaly. No carotid bruit. Range of motion of neck normal.  RESPIRATORY: Good respiratory effort. Not using accessory muscles of respiration. Bilateral vesicular breath sounds present. No rales or rhonchi.  CARDIOVASCULAR: S1, S2 regular. No murmurs appreciated. Peripheral pulses equal at carotid, femoral, and pedal pulses. No peripheral edema.  GASTROINTESTINAL: Abdomen is soft, mild tenderness in the right side of the abdomen. No guarding. No rigidity. Bowel sounds present and equal in all quadrants.   MUSCULOSKELETAL: Diminished range of motion of the right upper extremity and the right lower extremity secondary due to the fracture of the right humerus and injury to the right leg he sustained  secondary due to fall 3 days ago. Otherwise range of motion of the left side is normal.  SKIN: Ecchymosis on the right side of the face surrounding the right eye secondary due to fall. Otherwise unremarkable.  LYMPHATIC: No cervical lymphadenopathy.  VASCULAR: Good dorsalis pedis and posterior tibial pulses.  NEUROLOGICAL: Alert, awake, and oriented x 3. Cranial nerves II-XII grossly intact. DTRs 2+ bilateral symmetrical. Motor strength is 5/5 in both upper and lower extremities.  PSYCHIATRIC: Judgment and insight are adequate. Alert and oriented x 3. Memory and mood within normal limits.   LABORATORY DATA: Serum glucose 147, BUN 19, creatinine 1.03, sodium 137, potassium 5.0, chloride 103, bicarbonate 27, total calcium 8.7, lipase 99, total protein 7.1, albumin 3.3, total bilirubin 0.9, alkaline phosphatase 49, AST 26, ALT 22, total CK 625. WBC is 10.7, hemoglobin 14.3, hematocrit 43.0, platelet count 220,000. Urinalysis unremarkable.   RADIOLOGIC DATA:  1.  X-ray of the right tibia and fibula: Anterior soft tissues with no underlying fracture or dislocation.  2.  X-ray of the right humerus: Surgical neck fracture of the proximal right humerus.  3.  Chest x-ray: Proximal right humeral  fracture. Lungs are clear.  4.  CT of the head without contrast study:  No acute intracranial findings.  5.  CT of the abdomen and the pelvis: No evidence of traumatic injury to the abdomen or pelvis  6.  CT of the right hip: No acute bony findings.   ASSESSMENT AND PLAN: A 69 year old Caucasian male with a past medical history of chronic obstructive pulmonary disease, hyperlipidemia, gastroesophageal reflux disease, transient ischemic attack, hypertension, history of recent fall 3 days ago secondary due to tripping secondary to mechanical fall presents to the Emergency Room with the complaints of unable to get out of the bed  due to pain and the family not able to take care of him at home.  1.  Pain following fall  sustained 3 days ago. Imaging studies negative for any new injuries except for the fracture of the right humerus. PLAN: We will admit for observation for pain control medications and supportive care since the patient's family is not able to take care of him at home. We will request OT/PT consultations and also we will request social service consultation for placement.  2.  Fracture, right humerus sustained secondary to fall 3 days ago: The patient is stable. He is using sling. PLAN: Pain control medications and Ortho consult requested for further advice.  3.  Chronic obstructive pulmonary disease: Stable on home medication. Continue same.  4.  Hypertension: Controlled on home medications. Continue same.  5.  History of transient ischemic attack: No new symptoms. Monitor.  6.  History of gastroesophageal reflux disease: Stable on proton pump inhibitor.   7.  Mild leukocytosis: No fever. No complaints. Likely secondary to stress. We will monitor for now, repeat CBC, and follow up temperature curve.  8.  Deep vein thrombosis prophylaxis: Subcutaneous Lovenox.  9.  Gastrointestinal prophylaxis: Proton pump inhibitor.   CODE STATUS: Full code.   TIME SPENT: 55 minutes.    ____________________________ Juluis Mire, MD enr:bm D: 07/31/2014 02:53:33 ET T: 07/31/2014 03:17:57 ET JOB#: [id number]  cc: Juluis Mire, MD, <Dictator> Juline Patch, MD Juluis Mire MD ELECTRONICALLY SIGNED 07/31/2014 11:30

## 2014-11-23 NOTE — Discharge Summary (Signed)
PATIENT NAME:  Howard Anderson, Howard Anderson MR#:  [id number] DATE OF BIRTH:  10-31-1945  DATE OF ADMISSION:  10/17/2013 DATE OF DISCHARGE:  10/19/2013  For a detailed note, please take a look at the history and physical done on admission by Dr. Laurin Coder.   DIAGNOSES AT DISCHARGE:  Are as follows:  1.  Chronic obstructive pulmonary disease exacerbation.  2.  Acute bronchitis.  3.  Hypertension.  4.  Glaucoma.   DIET:  The patient is being discharged on a low-sodium diet.   ACTIVITY:  As tolerated.   FOLLOW-UP:  Dr. Otilio Miu in the next 1 to 2 weeks.   DISCHARGE MEDICATIONS:  Latanoprost eye drops one drop to each eye at bedtime, verapamil 180 mg twice daily, albuterol inhaler 2 puffs 4 times daily as needed, Lovaza 1000 mg one cap twice daily, ranitidine 150 mg half a tab at bedtime, atorvastatin 10 mg at lunchtime, PreserVision 1 tab twice daily, prednisone taper starting at 60 mg down to 10 mg over the next six days and azithromycin 500 mg daily x 5 days.   PERTINENT STUDIES DONE DURING THE HOSPITAL COURSE:  A CT scan of the chest done with contrast on admission showing negative for PE, linear scarring atelectasis right middle lobe and right upper lobe, mild ground-glass density in the upper lobe bilaterally which could represent an acute pneumonitis or scarring.  Chest x-ray done showing no acute cardiopulmonary disease.  A 2-dimensional echocardiogram done showing an ejection fraction of 55% to 60%.   HOSPITAL COURSE:  This is a 69 year old male with medical problems as mentioned above, presented to the hospital with shortness of breath and wheezing and noted to be in COPD exacerbation.  1.  COPD exacerbation.  This was likely secondary to the acute bronchitis and likely cause of the patient's worsening shortness of breath.  The patient also has ongoing tobacco abuse which could have also contributed to his COPD exacerbation.  The patient was admitted to the hospital, started on IV steroids,  around-the-clock nebulizer treatments, also started on Advair, Spiriva.  The patient significantly improved after aggressive therapy.  He was also given Zithromax for the underlying bronchitis and discharged on that.  The patient was ambulated and did qualify for home oxygen which was arranged for him prior to discharge.  Prior to discharge, the patient was less bronchospastic and was clinically feeling well.  2.  Hypertension.  The patient remained hemodynamically stable.  He will continue his verapamil.  3.  Hyperlipidemia.  The patient was maintained on his atorvastatin.  He will resume that.  4.  GERD.  The patient was maintained on his Protonix.  He will resume that.  5.  Glaucoma.  The patient was maintained on his latanoprost eye drops and he will also resume that upon discharge.   CODE STATUS:  THE PATIENT IS A FULL CODE.   TIME SPENT:  40 minutes.    ____________________________ Belia Heman. Verdell Carmine, MD vjs:ea D: 10/19/2013 16:37:17 ET T: 10/19/2013 23:16:36 ET JOB#: [id number]  cc: Belia Heman. Verdell Carmine, MD, <Dictator> Juline Patch, MD Henreitta Leber MD ELECTRONICALLY SIGNED 10/26/2013 20:27

## 2014-11-23 NOTE — Consult Note (Signed)
Brief Consult Note: Diagnosis: Right proximal humerus fracture, minimally displaced.   Patient was seen by consultant.   Consult note dictated.   Recommend further assessment or treatment.   Orders entered.   Comments: Patient's fracture will not require surgical intervention.  Continue sling use for 2-3 more weeks.  Patient may perform pendulum activities but should avoid abduction or forward elevation activity.  He may follow up in my office in 2-3 weeks for re-evaluation.  I will order repeat shoulder xrays to ensure no displacement to the fracture since 12/26.  Electronic Signatures: Thornton Park (MD)  (Signed 30-Dec-15 12:14)  Authored: Brief Consult Note   Last Updated: 30-Dec-15 12:14 by Thornton Park (MD)

## 2014-11-23 NOTE — Consult Note (Signed)
General Aspect Abdominal aorrtic aneurysm   Present Illness The patient is a 69 year old male with a past medical history significant for COPD, hyperlipidemia, gastroesophageal reflux disease, TIA and hypertension.  He has a recent history of a fall after tripping at home, 3 days ago.  At that time he was found to have multiple injuries including a right humerus fracture and after treatment was discharged to home.  He presented back to the Emergency Room with the complaints of unable to get out of the bed secondary due to pain on the right side of the body and also the patient's family able to take care of him at home. On this occasion he complaind of flank and hip pain as well as abdominal discomfort and because of this a CT of the abdomen was obtained which demonstrates a 5.5 cm nonruptured AAA.  The patient denies past back or abdominal pain.  No past episodes of blue toe or claudication.  PAST MEDICAL HISTORY:  1.  COPD.  2.  Hyperlipidemia.  3.  Gastroesophageal reflux disease.  4.  TIA.  5.  Hypertension.   PAST SURGICAL HISTORY:  1.  Left eardrum patch.  2.  Left parotidectomy.   Home Medications: Medication Instructions Status  acetaminophen-oxyCODONE 325 mg-5 mg tablet 1 tab(s) orally every 4 hours- as needed for pain  Active  Advair Diskus 250 mcg-50 mcg inhalation powder 1 puff(s) inhaled 2 times a day Active  Spiriva Respimat 2.5 mcg/inh inhalation aerosol 1 puff(s) inhaled once a day Active  doxycycline 20 mg oral capsule 1 cap(s) orally every 12 hours Active  Lovaza ethyl esters 1000 mg oral capsule 1 cap(s) orally 2 times a day Active  ranitidine 150 mg oral tablet 0.5 tab(s) orally once a day (at bedtime) Active  atorvastatin 10 mg oral tablet 1 tab(s) orally once a day at lunch Active  PreserVision Antioxidant Multiple Vitamins and Minerals oral capsule 1 cap(s) orally 2 times a day Active  latanoprost ophthalmic 0.005% ophthalmic solution 1 drop(s) to each eye once a  day (at bedtime) Active  verapamil 180 mg/12 hours oral tablet, extended release 1 tab(s) orally 2 times a day Active    No Known Allergies:   Case History:  Family History Non-Contributory   Social History positive  tobacco (Current within 1 year), negative ETOH, negative Illicit drugs   Review of Systems:  Fever/Chills No   Cough states he is unable because of weakness   Sputum No   Abdominal Pain Yes   Diarrhea No   Constipation No   Nausea/Vomiting No   SOB/DOE Yes   Chest Pain No   Telemetry Reviewed NSR   Dysuria No   Physical Exam:  GEN well developed, well nourished, obese   HEENT hearing intact to voice, dry oral mucosa, poor dentition   NECK supple  trachea midline   RESP normal resp effort  no use of accessory muscles   CARD regular rate  no JVD   ABD denies tenderness  soft   EXTR popliteal pulses are normal 2+ PT bilateral 1+ DP   SKIN No rashes, No ulcers   NEURO cranial nerves intact, follows commands, motor/sensory function intact   PSYCH alert, A+O to time, place, person   Nursing/Ancillary Notes: **Vital Signs.:   30-Dec-15 07:49  Vital Signs Type Routine  Temperature Temperature (F) 98  Celsius 36.6  Temperature Source oral  Pulse Pulse 98  Respirations Respirations 20  Systolic BP Systolic BP 333  Diastolic BP (mmHg)  Diastolic BP (mmHg) 80  Mean BP 96  Pulse Ox % Pulse Ox % 93  Pulse Ox Activity Level  At rest  Oxygen Delivery Room Air/ 21 %   Hepatic:  29-Dec-15 19:49   Bilirubin, Total 0.9  Alkaline Phosphatase 49 (46-116 NOTE: New Reference Range 02/19/14)  SGPT (ALT) 22 (14-63 NOTE: New Reference Range 02/19/14)  SGOT (AST) 26  Total Protein, Serum 7.1  Albumin, Serum  3.3  Routine Chem:  29-Dec-15 19:49   Lipase 99 (Result(s) reported on 30 Jul 2014 at 08:13PM.)  Glucose, Serum  147  BUN  19  Creatinine (comp) 1.03  Sodium, Serum 137  Potassium, Serum 5.0  Chloride, Serum 103  CO2, Serum 27   Calcium (Total), Serum 8.7  Osmolality (calc) 279  eGFR (African American) >60  eGFR (Non-African American) >60 (eGFR values <28m/min/1.73 m2 may be an indication of chronic kidney disease (CKD). Calculated eGFR, using the MRDR Study equation, is useful in  patients with stable renal function. The eGFR calculation will not be reliable in acutely ill patients when serum creatinine is changing rapidly. It is not useful in patients on dialysis. The eGFR calculation may not be applicable to patients at the low and high extremes of body sizes, pregnant women, and vegetarians.)  Anion Gap 7  Cardiac:  29-Dec-15 19:49   CK, Total  625 (Result(s) reported on 30 Jul 2014 at 08:18PM.)  Routine UA:  29-Dec-15 19:40   Color (UA) Yellow  Clarity (UA) Clear  Glucose (UA) Negative  Bilirubin (UA) Negative  Ketones (UA) Trace  Specific Gravity (UA) 1.058  Blood (UA) Negative  pH (UA) 5.0  Protein (UA) Negative  Nitrite (UA) Negative  Leukocyte Esterase (UA) Negative (Result(s) reported on 30 Jul 2014 at 09:35PM.)  RBC (UA) 2 /HPF  WBC (UA) 1 /HPF  Bacteria (UA) NONE SEEN  Epithelial Cells (UA) NONE SEEN  Mucous (UA) PRESENT (Result(s) reported on 30 Jul 2014 at 09:35PM.)  Routine Hem:  29-Dec-15 19:49   WBC (CBC)  10.7  RBC (CBC) 4.79  Hemoglobin (CBC) 14.3  Hematocrit (CBC) 43.0  Platelet Count (CBC) 220 (Result(s) reported on 30 Jul 2014 at 08:08PM.)  MCV 90  MCH 29.9  MCHC 33.2  RDW 13.9   CT:    29-Dec-15 20:52, CT Abdomen and Pelvis With Contrast  CT Abdomen and Pelvis With Contrast   REASON FOR EXAM:    (1) NO ORAL CONTRAST. right lower abd pain and pelvic   pain after fall 3 days ago  COMMENTS:   May transport without cardiac monitor    PROCEDURE: CT  - CT ABDOMEN / PELVIS  W  - Jul 30 2014  8:52PM     CLINICAL DATA:  Fall 3 days ago, right hip pain, right arm fracture    EXAM:  CT ABDOMEN AND PELVIS WITH CONTRAST    TECHNIQUE:  Multidetector CT imaging of the  abdomen and pelvis was performed  using the standard protocol following bolus administration of  intravenous contrast.  CONTRAST:  100 mL Omnipaque 350 IV    COMPARISON:  None.    FINDINGS:  Lower chest: Trace right pleural effusion with associated dependent  atelectasis the right lung base.    The heart is top-normal in size.    Hepatobiliary: Liver is within normal limits.    Gallbladder is unremarkable. No intrahepatic or extrahepatic ductal  dilatation.  Pancreas: Within normal limits.    Spleen: Within normal limits.    Adrenals/Urinary Tract: Adrenal  gland unremarkable.    Small bilateral renal cysts measuring up to 8 mm. No hydronephrosis.    Bladder is within normal limits.    Stomach/Bowel: Stomach is unremarkable.    No evidence of bowel obstruction.    Normal appendix.  Vascular/Lymphatic: 5.4 x 5.4 cm infrarenal abdominal aortic  aneurysm with eccentric mural thrombus (series 2/ image 42).    Atherosclerotic calcifications of the abdominal aorta and branch  vessels.    No suspicious abdominopelvic lymphadenopathy.    Reproductive: Prostatomegaly.    Other: No abdominopelvic ascites.    Musculoskeletal: Degenerative changes of the visualized  thoracolumbar spine.  Dedicated evaluation of the right hip will be dictated separately.  No fracture is seen.     IMPRESSION:  No evidence of traumatic injury to the abdomen/pelvis.    5.4 cm infrarenal abdominal aortic aneurysm. Recommend followup by  abdomen and pelvis CTA in 3-6 months, and vascular surgery  referral/consultation if not already obtained.    This recommendation follows ACR consensus guidelines: White Paper of  the ACR Incidental Findings Committee II on Vascular Findings. J Am  Coll Radiol 2013; 10:789-794.    Electronically Signed    By: Julian Hy M.D.    On: 07/30/2014 21:29         Verified By: Julian Hy, M.D.,    Impression 1.  AAA it is large and should be  repaired given it's size.  CT shows it is likely a stent candidate.  However, given his current profoundly deconditioned state (secondary to his fall) I would plan repair after he has recovered somewhat and can at least perform activities of daily living.  I would like to see him in the office in 2 weeks at which time I will again discuss repair and his risks of both surgery and not having surgery. 2.  Pain following fall sustained 3 days ago. Imaging studies negative for any new injuries except for the fracture of the right humerus. He is admited for pain control and supportive care since the patient's family is not able to take care of him at home. OT/PT consultations and also we will request social service consultation for placement.  3.  Chronic obstructive pulmonary disease: Stable on home medication. Continue same.  4.  Hypertension: Controlled on home medications. Continue same.  5.  History of transient ischemic attack: No new symptoms. Monitor.  6.  History of gastroesophageal reflux disease: Stable on proton pump inhibitor.   Plan level 4 consult   Electronic Signatures: Hortencia Pilar (MD)  (Signed 30-Dec-15 22:20)  Authored: General Aspect/Present Illness, Home Medications, Allergies, History and Physical Exam, Vital Signs, Labs, Radiology, Impression/Plan   Last Updated: 30-Dec-15 22:20 by Hortencia Pilar (MD)

## 2014-11-23 NOTE — H&P (Signed)
PATIENT NAME:  Howard Anderson, Howard Anderson MR#:  [id number] DATE OF BIRTH:  [birth year]  DATE OF ADMISSION:  10/17/2013  REASON FOR ADMISSION: COPD exacerbation, shortness of breath.   REFERRING PHYSICIAN: .  PRIMARY CARE PHYSICIAN: Otilio Miu, MD  HISTORY OF PRESENT ILLNESS: This is a very nice 69 year old gentleman with history of shortness of breath that started about 10 days ago. The patient has gone through 2 regimens of antibiotics, right now is taking levofloxacin. He has not had any significant improvement after that. The patient says that his shortness of breath is worse with activity. Every time that he gets around, has shortness of breath. The worst shortness of breath happens whenever he is walking over 200 feet, but overall he feels like he is always huffing and puffing.   He started with significant dry cough that has been very consistent. No sputum has been produced whatsoever. He denies any fevers, chills, or any other respiratory problems. He smokes a pack a day but he states that he is willing to quit.   The patient is admitted for treatment of mild COPD exacerbation. The patient has never been diagnosed with COPD but clinically he does have it as he has continued to have shortness of breath, cough intermittently at least more than twice a year.   REVIEW OF SYSTEMS: A 12-system review of systems. CONSTITUTIONAL: No fever. No weakness. Positive fatigue. No significant weight loss or weight gain.  EYES: No blurry vision, no icterus .  ENT:  No difficulty swallowing. Positive hearing loss with hearing aids. No nasal discharge.  RESPIRATORY: Positive cough. Positive wheezing. No hemoptysis. Nodyspnea. Positive dyspnea. Positive COPD.  CARDIOVASCULAR: No chest pain, orthopnea, or syncope. The patient has occasional edema of the lower extremities, mild.  GASTROINTESTINAL: No nausea, vomiting, abdominal pain, constipation, or diarrhea.  GENITOURINARY: No dysuria, hematuria, or changes in  frequency.  ENDOCRINE: No polyuria, polydipsia, polyphagia, cold or heat intolerance.  HEMOLYMPHATIC: No anemia, easy bruising or bleeding.  MUSCULOSKELETAL: No significant back pain or joint pain.  NEUROLOGIC: No numbness, tingling, CVAs or TIAs.  PSYCHIATRIC: No insomnia or depression.   PAST MEDICAL HISTORY:  1.  Hypertension.  2.  Hyperlipidemia.  3.  GERD.  4.  Glaucoma.  5.  Hearing loss.  6.  Ongoing smoking.  7.  Glaucoma.   MEDICATIONS:  1.  Verapamil 180 mg extended release twice daily.  2.  Atorvastatin 10 mg once a day.  3.  Doxycycline 20 mg two times a day due to problems with his gums. We are going to hold this medication as he is on azithromycin.  4.  ProAir inhaler as needed.  5.  Ranitidine 150mg  once a day.  6.  Lovaza 1000 mg twice daily.  7.  Latanoprost 0.005% at bedtime.  8.  Levofloxacin 750 mg once daily.  9.  PreserVision 1 capsule twice daily.   ALLERGIES: No known drug allergies.   PAST SURGICAL HISTORY: Ear surgery?, both sides, at the level of the angle of mandible for TMJ  FAMILY HISTORY: Negative for MI. Positive for kidney cancer in his brother, colon cancer in his other brother with possible leukemia in one of the brothers as well. No CVA.   SOCIAL HISTORY: He is a smoker of 1 pack a day since his teenage years. He does not drink alcohol. He lives with his wife. He works as a Engineer, maintenance (IT).   Smoking cessation counseling given to the patient for over 4 minutes. He decided he is on a  need to quit.   PHYSICAL EXAMINATION:  VITAL SIGNS: Blood pressure 149/93, pulse 81, respirations 20, temperature 97.9.  GENERAL: Alert, oriented x3, in no acute distress. No respiratory distress. Hemodynamically stable.  HEENT: Pupils are equal and reactive. Extraocular movements are intact. Mucosae are dry. Anicteric sclerae. Pink conjunctivae. No oral lesions. No oropharyngeal exudates.  NECK: Supple. No JVD. No thyromegaly. No adenopathy. No carotid bruits.   CARDIOVASCULAR: Regular rate and rhythm. No murmurs, rubs or gallops are appreciated. No displacement of PMI.  LUNGS: Clear without any wheezing or crepitus. No use of accessory muscles. No wheezing at this moment, although ED DR. described to me the patient was wheezing really hard. At this moment, I only hear some diffuse rhonchi whenever he coughs. No dullness to percussion.  ABDOMEN: Soft, nontender, nondistended. No hepatosplenomegaly. No masses. Bowel sounds are positive.  GENITAL: Deferred.  EXTREMITIES: No edema, cyanosis or clubbing. Pulses +2. Capillary refill less than 3.  SKIN: No rashes or petechiae.  NEUROLOGIC: Cranial nerves II through XII intact. Strength is 5/5 in four extremities. No focal findings.  PSYCHIATRIC: Negative for agitation. The patient is alerted x3.  MUSCULOSKELETAL: No joint effusions or joint swelling.  LYMPHATIC: Negative for lymphadenopathy in neck or supraclavicular areas.   DIAGNOSTIC RESULTS: Glucose 102, creatinine 0.95, sodium 136. Other electrolytes were normal. LFTs not done here. White count 7.9, hemoglobin 16, platelet count 185. Troponin is negative.   CHEST X-RAY: No active cardiopulmonary disease.   CT scan of the chest to rule out PE:  Negative for pulmonary embolism. Mild scarring with atelectasis, right lower lobe and right upper lobe. Mild ground-glass density in the upper lobes bilaterally which could be due to acute pneumonitis or scarring.   ASSESSMENT AND PLAN: A 69 year old gentleman with history of shortness of breath, history of hypertension, hyperlipidemia, gastroesophageal reflux disease, glaucoma, hearing loss, who comes today with worsening shortness of breath.  1.  Shortness of breath with mild respiratory failure. Actually, his oxygen saturation went down to 90 on room air. The patient has worsening symptoms whenever he walks. At this moment, I think this is all related to chronic obstructive pulmonary disease. The patient has  never had a formal evolution. He does not have PFTs. We are going to try to arrange that outpatient once he is better.   Overall, the patient is admitted with treatment with the steroids, nebulizers, and antibiotics. He has been taking levofloxacin for the past 10 days, and now he is mostly not having any response, for what he came to the Emergency Department. We are going to start him on azithromycin. The CT scan shows ground-glass opacities on the upper lobes that could be scarring or pneumonitis. We are going to treat him with azithromycin for possible pneumonitis.   The patient is stable otherwise.   2.  Hypertension seems to be stable. Continue treatment with verapamil.   3.  Hyperlipidemia. Continue atorvastatin.   4.  Gastroesophageal reflux disease. Continue proton pump inhibitor.   5.  Other medical problems are stable.   6.  Deep vein thrombosis prophylaxis with Lovenox.   TOTAL TIME SPENT: I spent about 45 minutes with this patient.   ____________________________ Wilmore Sink, MD rsg:np D: 10/17/2013 17:27:12 ET T: 10/17/2013 18:12:32 ET JOB#: [id number]  cc: Evergreen Sink, MD, <Dictator> Pariss Hommes America Brown MD ELECTRONICALLY SIGNED 10/28/2013 13:38

## 2014-11-25 DIAGNOSIS — I10 Essential (primary) hypertension: Secondary | ICD-10-CM | POA: Diagnosis not present

## 2014-11-25 DIAGNOSIS — M1990 Unspecified osteoarthritis, unspecified site: Secondary | ICD-10-CM | POA: Diagnosis not present

## 2014-11-25 DIAGNOSIS — I714 Abdominal aortic aneurysm, without rupture: Secondary | ICD-10-CM | POA: Diagnosis not present

## 2014-11-25 DIAGNOSIS — J449 Chronic obstructive pulmonary disease, unspecified: Secondary | ICD-10-CM | POA: Diagnosis not present

## 2014-11-25 DIAGNOSIS — E669 Obesity, unspecified: Secondary | ICD-10-CM | POA: Diagnosis not present

## 2014-11-25 DIAGNOSIS — E785 Hyperlipidemia, unspecified: Secondary | ICD-10-CM | POA: Diagnosis not present

## 2014-11-27 DIAGNOSIS — H8111 Benign paroxysmal vertigo, right ear: Secondary | ICD-10-CM | POA: Diagnosis not present

## 2014-12-01 NOTE — Discharge Summary (Signed)
PATIENT NAME:  Howard Anderson, Howard Anderson MR#:  [id number] DATE OF BIRTH:  1946-05-11  DATE OF ADMISSION:  08/02/2014 DATE OF DISCHARGE:  08/05/2014   ADMITTING DIAGNOSIS: Status post fall.   DISCHARGE DIAGNOSES:  1.  Status post fall with right humerus proximal fracture in a sling; outpatient orthopedic evaluation, no surgical intervention.  2.  Hypoxia due to rib fracture. 3.  Difficulty with ambulation due to pain and fall; needs further rehabilitation.  4.  Chronic obstructive pulmonary disease without any evidence of acute exacerbation.  5.  Hypertension, which is essential.  6.  History of transient ischemic attack.  7.  Gastroesophageal reflux disease.  8.  Mild rhabdomyolysis as a result of fall; CPK trending down.  9.  Morbid obesity.  10.  Status post left eardrum patch.  11.  Status post left parotidectomy.  12.  Abdominal aortic aneurysm, 5.5 cm, noted on a CT; will need outpatient followup with vascular surgery.   CONSULTANTS: Dr. Thornton Park.   PERTINENT AND EVALUATIONS: Admitting glucose 147, BUN 19, creatinine 1.03, sodium 137, potassium 5.0, chloride 103, CO2 of 27, calcium 8.7. LFTs are normal, with a total albumin of 3.3. CPK is 625, 606, 896 and most recent one was 217. WBC 10.7, hemoglobin 14.3, platelet count 220,000. Urinalysis negative.   HOSPITAL COURSE: Please refer to the H and P done by the admitting physician. The patient is a 69 year old white male with multiple medical problems as stated above, who had a fallen and was found to have right humerus fracture and was sent home on home medications. She presented to the ED with  complaint of being unable to walk and having difficulty with shortness of breath.   The patient was evaluated in the ED, and had a CT of the abdomen and pelvis which did show an abdominal aortic aneurysm, for which she was seen by vascular surgery who recommended outpatient followup with likely repair. The patient also was noted to have a shoulder  fracture, for which Dr. Mack Guise recommended a sling and recommended followup with him. The patient will require further rehabilitation and is currently being arranged for rehab. At this time, he is stable for discharge.   DISCHARGE ACTIVITY: As tolerated, with PT evaluation and treatment for his right shoulder. He has to have a sling. He may perform pendulum exercises activities, but should avoid abduction or forward elevation activity of the right arm.   DISCHARGE MEDICATIONS: Latanoprost ophthalmic 0.005% 1 drop to each eye at bedtime, verapamil 180 mg 1 tablet p.o. b.i.d., Lovaza 1000 mg b.i.d., ranitidine 150 half tablet at bedtime, atorvastatin 10 at bedtime, PreserVision 1 tablet p.o. b.i.d., Advair 1 puff b.i.d., Spiriva 1 puff daily, doxycycline 20 mg 1 tablet p.o. q. 12 hours which he is chronically for rosacea,  Tylenol 650 q. 4 hours p.r.n., acetaminophen hydrocodone 325/5 q. 4 hours p.r.n. for pain, Lovenox 40 mg subcutaneous b.i.d. x10 days, Senna 1 tablet p.o. b.i.d., Colace 100 mg 2 tablets b.i.d.  DISCHARGE FOLLOWUP: With the MD at the skilled nursing facility in 1-2 weeks. Dr. Mack Guise in 2 weeks. Dr. [name] in 2-4 weeks for abdominal aortic aneurysm.   TIME SPENT ON THIS PATIENT'S DISCHARGE: 35 minutes.     ____________________________ Lafonda Mosses Posey Pronto, MD shp:MT D: 08/05/2014 11:59:15 ET T: 08/05/2014 12:38:52 ET JOB#: [id number]  cc: Sharion Grieves H. Posey Pronto, MD, <Dictator> Alric Seton MD ELECTRONICALLY SIGNED 08/10/2014 9:23

## 2014-12-06 ENCOUNTER — Encounter: Payer: Medicare Other | Admitting: Physical Therapy

## 2014-12-09 ENCOUNTER — Ambulatory Visit: Payer: Medicare Other | Attending: Unknown Physician Specialty

## 2014-12-09 VITALS — BP 130/82 | HR 79

## 2014-12-09 DIAGNOSIS — R42 Dizziness and giddiness: Secondary | ICD-10-CM

## 2014-12-09 DIAGNOSIS — R2689 Other abnormalities of gait and mobility: Secondary | ICD-10-CM

## 2014-12-09 DIAGNOSIS — H8111 Benign paroxysmal vertigo, right ear: Secondary | ICD-10-CM | POA: Insufficient documentation

## 2014-12-09 NOTE — Therapy (Signed)
[place] MAIN [facility] SERVICES [address] [place], [place], [address] Phone: [phone]   Fax:  [phone]  Physical Therapy Evaluation  Patient Details  Name: Howard Anderson MRN: [id number] Date of Birth: [birth year] Referring Provider:  Beverly Gust, MD  Encounter Date: 12/09/2014      PT End of Session - 12/09/14 1426    Visit Number 1   Number of Visits 13   Date for PT Re-Evaluation 01/20/15   Authorization Type g code every 10th visit   PT Start Time 1100   PT Stop Time 1215   PT Time Calculation (min) 75 min   Activity Tolerance Patient tolerated treatment well   Behavior During Therapy [facility] for tasks assessed/performed      Past Medical History  Diagnosis Date  . Hypertension   . Heart disease   . AAA (abdominal aortic aneurysm) without rupture   . Chest pain   . Shoulder fracture, right   . COPD (chronic obstructive pulmonary disease)     No past surgical history on file.  Filed Vitals:   12/09/14 1105  BP: 130/82  Pulse: 79    Visit Diagnosis:  Dizziness and giddiness - Plan: PT plan of care cert/re-cert  Balance problem - Plan: PT plan of care cert/re-cert      Subjective Assessment - 12/09/14 1411    Subjective "I feel unsteady"   Patient is accompained by: Family member   Pertinent History Pt is a poor historian and frequently deviates to conversational tangents. Pt reports that he fell down 3-4 concrete steps the day after Christmas. He struck his head when he fell and came to [facility] ED. He was examined without evidence for fractures and sent home with pain medications. The medications didn't help his pain and he came back to the hospital at which point he was admitted for pain control. After he left the hospital he was sent to SNF where he stayed for approximately 3 weeks. He started to have vertigo while at the SNF. Pt went to ENT where he was diagnosed with bilateral BPPV and he was treated with CRT for 2  sessions. Pt reports that the vertigo improved. He denies any spinning sensations over the last 2 months. Currently he is complaining of imbalance. He describes the problem as constant which varies in intensity.  Aggravating factors are quick positional changes (sit to stand). No known alleviating factors. Worst reported imbalance is 3/10. Best is 3/10. Present 3/10. No reported visual disturbance. Denies vertigo. Denies nausea or vomiting. Intermittent tinnintus which is chronic prior to current episodes. Denies aural fullness, aural discharge, or pain. Denies headaches. Only one reported falls in the last 12 months. Denies chills, fever, or night sweats. Reports 9# weight loss since December. Pt has a small AAA that is awaiting repair but surgeon wanted to wait until his vertigo has resolved before performing the surgery. Pt has had multiple diagnostic imaging of his brain but does not have results. He states that everything has been normal. Pt denies dysarthria, dysphagia, drop attacks, or double vision. Denies visual changes. He has a high fear of falling. Denies trouble with driving. Wears corrective lenses and vision checked within the last 4-5 months (prescription unchanged). Pt reports history of glaucoma. Denies numbness/tingling in hands or feet. He does report tingling in L thigh with light touch over the last couple months.    Limitations Walking   Patient Stated Goals "I would like to decrease my imbalance so  I can have my AAA surgery and decrease my fear of falling"   Currently in Pain? No/denies            [facility] PT Assessment - 12/09/14 1413    Assessment   Medical Diagnosis Bilateral BPPV   Onset Date 07/02/14   Next MD Visit Unknown   Prior Therapy No   Precautions   Precautions None   Restrictions   Weight Bearing Restrictions No   Balance Screen   Has the patient fallen in the past 6 months Yes   How many times? 1   Has the patient had a decrease in activity level because of  a fear of falling?  No   Is the patient reluctant to leave their home because of a fear of falling?  No   Home Environment   Living Enviornment Private residence   Living Arrangements Spouse/significant other   Available Help at Discharge Family   Type of [place] to enter   Entrance Stairs-Number of Steps 4   Entrance Stairs-Rails Can reach both   [place] One level   [place] None   Prior Function   Level of Independence Independent with basic ADLs;Independent with gait;Independent with transfers   Vocation Full time employment            Vestibular Assessment - 12/09/14 1416    Vestibular Assessment   General Observation Pt is an overweight caucasian male with forward head posture who appears to be of generally poor health.    Symptom Behavior   Type of Dizziness Imbalance   Frequency of Dizziness Constant   Duration of Dizziness Days   Aggravating Factors Supine to sit   Relieving Factors No known relieving factors   Occulomotor Exam   Occulomotor Alignment Normal   Spontaneous Absent   Gaze-induced Absent   Head shaking Horizontal R beating nystagmus   Smooth Pursuits Saccades   Saccades Dysmetria;Slow   Comment Abnormal horizontal saccades. Ocular ROM appears fully intact but possibly decreased vertical gaze.   Vestibulo-Occular Reflex   VOR 1 Head Only (x 1 viewing) Positive for dizziness   VOR Cancellation Normal   Comment VOR head thrust positive bilateral   Positional Testing   Dix-Hallpike Dix-Hallpike Right;Dix-Hallpike Left   Dix-Hallpike Right   Dix-Hallpike Right Duration 25-30 beats   Dix-Hallpike Right Symptoms Upbeat, right rotatory nystagmus   Dix-Hallpike Left   Dix-Hallpike Left Duration Absent   Dix-Hallpike Left Symptoms No nystagmus   Cognition   Cognition Orientation Level Appropriate for developmental age 69   Cognition Comment Pt is somewhat slow to respond to questions and deviates with conversation                 Vestibular Treatment/Exercise - 12/09/14 0001    Vestibular Treatment/Exercise   Vestibular Treatment Provided Canalith Repositioning   Canalith Repositioning Epley Manuever Right    EPLEY MANUEVER RIGHT   Number of Reps  2   Overall Response Improved Symptoms   Response Details  First intervention with 25-30 beats of appropriate nystagmus and pt with symptomatic vertigo which diminishes within 20 seconds. Second treatment pt with no nystagmus or symptoms but intervention completed               PT Education - 12/09/14 1424    Education provided Yes   Education Details Educated about posterior canal BPPV and interventions. Discussed frequency of treatment and expected outcomes   Person(s) Educated Patient   Methods  Explanation   Comprehension Verbalized understanding             PT Long Term Goals - 2015-01-02 1612    PT LONG TERM GOAL #1   Title Patient will increase Berg Balance score by > 6 points to demonstrate decreased fall risk during functional activities.   Time 6   Period Weeks   Status New   PT LONG TERM GOAL #2   Title Patient will increase ABC Scale score to > or = 67% demonstrating a decreased risk for falls.   Time 6   Period Weeks   Status New   PT LONG TERM GOAL #3   Title Patient will report <2/10 vertigo with provoking motions or positions.   Time 6   Period Weeks   Status New   PT LONG TERM GOAL #4   Title Patient demonstrates reduced falls risk as evidenced by > 21/24 on Dynamic Gait Index   Time 6   Period Weeks   Status New               Plan - 01-02-15 1439    Clinical Impression Statement Pt is a pleasant 69 year-old Caucasian male referred by ENT for bilateral BPPV. Pt with history of R humeral fracture (5 months ago) making Epley a challenge to perform at ENT office. PT examination reveals abnormal smooth pursuit and horizontal saccades indicating possible central contribution to symptoms. Marye Round  is negative on L and positive on R with appropriate upbeating R torsional nystagmus indicative of R posterior canal BPPV. Pt treated today with CRT twice for R BPPV with resolution of symptoms. Luckily CRT for R posterior involves rolling onto L side thus avoiding painful R shoulder. Examination findings also include positive VOR head thrust bilaterally indicative of decreased vestibular function. Unfortunately, VNG testing results including caloric testing not available at this session. Will request records from ENT. Pt will beneft from skilled PT services to address deficits noted in the evaluation in order to improve vestibular function and balance.   Pt will benefit from skilled therapeutic intervention in order to improve on the following deficits Decreased balance   Rehab Potential Good   Clinical Impairments Affecting Rehab Potential positive: motivation, improving symptoms, negative: central signs, age   PT Frequency 2x / week   PT Duration 6 weeks   PT Treatment/Interventions Gait training;Neuromuscular re-education;Therapeutic activities;Therapeutic exercise;Balance training;Passive range of motion;Other (comment)  Canalith repositioning treatment   PT Next Visit Plan Perform BERG, DGI, foam/firm eyes open/closed, ABC, and DHI   PT Home Exercise Plan None provided today. At next session will provide VOR x 1 horizontal as appropriate   Recommended Other Services No   Consulted and Agree with Plan of Care Patient          G-Codes - 01-02-15 1629    Functional Assessment Tool Used MMT/clinical judgement   Functional Limitation Mobility: Walking and moving around   Mobility: Walking and Moving Around Current Status [phone]) At least 20 percent but less than 40 percent impaired, limited or restricted   Mobility: Walking and Moving Around Goal Status [phone]) At least 1 percent but less than 20 percent impaired, limited or restricted       Problem List There are no active problems to  display for this patient.  [name], PT 01-02-2015, 4:47 PM  [place] MAIN [facility] SERVICES [address] [place], [place], [address] Phone: [phone]   Fax:  [phone]

## 2014-12-09 NOTE — Patient Instructions (Signed)
No HEP provided at this time.

## 2014-12-17 ENCOUNTER — Ambulatory Visit: Payer: Medicare Other

## 2014-12-17 DIAGNOSIS — R42 Dizziness and giddiness: Secondary | ICD-10-CM

## 2014-12-17 DIAGNOSIS — R2689 Other abnormalities of gait and mobility: Secondary | ICD-10-CM

## 2014-12-17 DIAGNOSIS — H8111 Benign paroxysmal vertigo, right ear: Secondary | ICD-10-CM | POA: Diagnosis not present

## 2014-12-17 NOTE — Therapy (Signed)
[place] MAIN [facility] SERVICES [address] [place], [place], [address] Phone: [phone]   Fax:  [phone]  Physical Therapy Treatment  Patient Details  Name: Howard Anderson MRN: [id number] Date of Birth: 02-Oct-1945 Referring Provider:  Beverly Gust, MD  Encounter Date: 12/17/2014      PT End of Session - 12/17/14 1602    Visit Number 2   Number of Visits 13   Date for PT Re-Evaluation 01/20/15   Authorization Type g code every 10th visit   PT Start Time 1350   PT Stop Time 1440   PT Time Calculation (min) 50 min   Activity Tolerance Patient tolerated treatment well   Behavior During Therapy [facility] for tasks assessed/performed      Past Medical History  Diagnosis Date  . Hypertension   . Heart disease   . AAA (abdominal aortic aneurysm) without rupture   . Chest pain   . Shoulder fracture, right   . COPD (chronic obstructive pulmonary disease)     History reviewed. No pertinent past surgical history.  There were no vitals filed for this visit.  Visit Diagnosis:  Dizziness and giddiness  Balance problem      Subjective Assessment - 12/17/14 1359    Subjective Pt states that the true dizziness has resolved. He has not had any bouts of vertigo. He continues to report "lightheadedness." He states that sometimes he feels like he is "floating." Otherwise pt has not questions or concerns currently.    Pertinent History Pt is a poor historian and frequently deviates to conversational tangents. Pt reports that he fell down 3-4 concrete steps the day after Christmas. He struck his head when he fell and came to [facility] ED. He was examined without evidence for fractures and sent home with pain medications. The medications didn't help his pain and he came back to the hospital at which point he was admitted for pain control. After he left the hospital he was sent to SNF where he stayed for approximately 3 weeks. He started to have vertigo  while at the SNF. Pt went to ENT where he was diagnosed with bilateral BPPV and he was treated with CRT for 2 sessions. Pt reports that the vertigo improved. He denies any spinning sensations over the last 2 months. Currently he is complaining of imbalance. He describes the problem as constant which varies in intensity.  Aggravating factors are quick positional changes (sit to stand). No known alleviating factors. Worst reported imbalance is 3/10. Best is 3/10. Present 3/10. No reported visual disturbance. Denies vertigo. Denies nausea or vomiting. Intermittent tinnintus which is chronic prior to current episodes. Denies aural fullness, aural discharge, or pain. Denies headaches. Only one reported falls in the last 12 months. Denies chills, fever, or night sweats. Reports 9# weight loss since December. Pt has a small AAA that is awaiting repair but surgeon wanted to wait until his vertigo has resolved before performing the surgery. Pt has had multiple diagnostic imaging of his brain but does not have results. He states that everything has been normal. Pt denies dysarthria, dysphagia, drop attacks, or double vision. Denies visual changes. He has a high fear of falling. Denies trouble with driving. Wears corrective lenses and vision checked within the last 4-5 months (prescription unchanged). Pt reports history of glaucoma. Denies numbness/tingling in hands or feet. He does report tingling in L thigh with light touch over the last couple months.    Patient Stated Goals "I would  like to decrease my imbalance so I can have my AAA surgery and decrease my fear of falling"   Currently in Pain? No/denies       TREATMENT: Pt provided with ABC and DHI questionnaires and reviewed after completed. Symptoms reviewed with patient; Marye Round testing for L side to confirm negative from initial evaluation. Dix Hallpike testing on R side with upbeating R torsional nystagmus of 25-30 beats with patient reporting symptoms. Pt  taken directly into CRT for R posterior canal BPPV. Inverted table utilized due to poor cervical extension. CRT repeated for a total of 3 treatments with no nystagmus or symptoms noted during second intervention. Each position held for approximately 2 minutes. At next session will repeat testing, and perform CRT if necessary. Will complete BERG, DGI, and firm/foam balance testing once BPPV is resolved. Will issue VOR x 1 horizontal in sitting to HEP.       [facility] PT Assessment - 12/17/14 0001    Observation/Other Assessments   Other Surveys  Select   Activities of Balance Confidence Scale (ABC Scale)  75%   Dizziness Handicap Inventory [facility])  54/100                             PT Education - 12/17/14 1601    Education provided Yes   Education Details Further education about BPPV and CRT. Will continue to treat until full resolution at which point will progress to gaze stabilization program   Person(s) Educated Patient   Methods Explanation   Comprehension Verbalized understanding             PT Long Term Goals - 12/09/14 1612    PT LONG TERM GOAL #1   Title Patient will increase Berg Balance score by > 6 points to demonstrate decreased fall risk during functional activities.   Time 6   Period Weeks   Status New   PT LONG TERM GOAL #2   Title Patient will increase ABC Scale score to > or = 67% demonstrating a decreased risk for falls.   Time 6   Period Weeks   Status New   PT LONG TERM GOAL #3   Title Patient will report <2/10 vertigo with provoking motions or positions.   Time 6   Period Weeks   Status New   PT LONG TERM GOAL #4   Title Patient demonstrates reduced falls risk as evidenced by > 21/24 on Dynamic Gait Index   Time 6   Period Weeks   Status New               Plan - 12/17/14 1603    Clinical Impression Statement Pt demonstrates 25-30 beat R torsional nystagmus with Marye Round. CRT performed x 3. Second bout pt with 5-10 beats  of appropriate nystagmus in Hallpike position but denies syptoms. No nystagmus noted during third CRT. Will continue to treat R posterior canal BPPV until resolution at which point will progress to gaze stabilization program.    PT Next Visit Plan Perform BERG, DGI, foam/firm eyes open/closed, ABC, and DHI if Dix Hallpike negative. Check orthostatics. Progress to gaze stabilizatio program.    PT Home Exercise Plan None provided today. At next session will provide VOR x 1 horizontal as appropriate   Recommended Other Services No   Consulted and Agree with Plan of Care Patient        Problem List There are no active problems to display for  this patient.  [name], PT  Sire Poet 12/17/2014, 4:16 PM  [place] MAIN [facility] SERVICES [address] [place], [place], [address] Phone: [phone]   Fax:  [phone]

## 2014-12-20 ENCOUNTER — Encounter: Payer: Self-pay | Admitting: Physical Therapy

## 2014-12-20 ENCOUNTER — Ambulatory Visit: Payer: Medicare Other

## 2014-12-20 DIAGNOSIS — R42 Dizziness and giddiness: Secondary | ICD-10-CM

## 2014-12-20 DIAGNOSIS — R2689 Other abnormalities of gait and mobility: Secondary | ICD-10-CM

## 2014-12-20 DIAGNOSIS — H8111 Benign paroxysmal vertigo, right ear: Secondary | ICD-10-CM | POA: Diagnosis not present

## 2014-12-20 NOTE — Therapy (Signed)
[place] MAIN [facility] SERVICES [address] [place], [place], [address] Phone: [phone]   Fax:  [phone]  Physical Therapy Treatment  Patient Details  Name: Howard Anderson MRN: [id number] Date of Birth: [birth year] Referring Provider:  Beverly Gust, MD  Encounter Date: 12/20/2014      PT End of Session - 12/20/14 0923    Visit Number 3   Number of Visits 13   Date for PT Re-Evaluation 01/20/15   Authorization Type g code every 10th visit   PT Start Time 0900   PT Stop Time 0930   PT Time Calculation (min) 30 min   Activity Tolerance Patient tolerated treatment well   Behavior During Therapy [facility] for tasks assessed/performed      Past Medical History  Diagnosis Date  . Hypertension   . Heart disease   . AAA (abdominal aortic aneurysm) without rupture   . Chest pain   . Shoulder fracture, right   . COPD (chronic obstructive pulmonary disease)     History reviewed. No pertinent past surgical history.  There were no vitals filed for this visit.  Visit Diagnosis:  Dizziness and giddiness  Balance problem      Subjective Assessment - 12/20/14 0852    Subjective Pt reports that he has not had any episodes of vertigo or dizziness since his last visit. He still complains of some "lightheadedness." He reports that his vertigo symptoms are progressively improving with each treatment. No reported falls since last session. No specific questions or concerns currently.    Pertinent History Pt is a poor historian and frequently deviates to conversational tangents. Pt reports that he fell down 3-4 concrete steps the day after Christmas. He struck his head when he fell and came to [facility] ED. He was examined without evidence for fractures and sent home with pain medications. The medications didn't help his pain and he came back to the hospital at which point he was admitted for pain control. After he left the hospital he was sent to SNF where  he stayed for approximately 3 weeks. He started to have vertigo while at the SNF. Pt went to ENT where he was diagnosed with bilateral BPPV and he was treated with CRT for 2 sessions. Pt reports that the vertigo improved. He denies any spinning sensations over the last 2 months. Currently he is complaining of imbalance. He describes the problem as constant which varies in intensity.  Aggravating factors are quick positional changes (sit to stand). No known alleviating factors. Worst reported imbalance is 3/10. Best is 3/10. Present 3/10. No reported visual disturbance. Denies vertigo. Denies nausea or vomiting. Intermittent tinnintus which is chronic prior to current episodes. Denies aural fullness, aural discharge, or pain. Denies headaches. Only one reported falls in the last 12 months. Denies chills, fever, or night sweats. Reports 9# weight loss since December. Pt has a small AAA that is awaiting repair but surgeon wanted to wait until his vertigo has resolved before performing the surgery. Pt has had multiple diagnostic imaging of his brain but does not have results. He states that everything has been normal. Pt denies dysarthria, dysphagia, drop attacks, or double vision. Denies visual changes. He has a high fear of falling. Denies trouble with driving. Wears corrective lenses and vision checked within the last 4-5 months (prescription unchanged). Pt reports history of glaucoma. Denies numbness/tingling in hands or feet. He does report tingling in L thigh with light touch over the last couple months.  Patient Stated Goals "I would like to decrease my imbalance so I can have my AAA surgery and decrease my fear of falling"   Currently in Pain? No/denies       TREATMENT: Symptoms reviewed with patient; Marye Round testing on R side with upbeating R torsional nystagmus of 20 beats with patient reporting symptoms. Pt taken directly into CRT for R posterior canal BPPV. Inverted table utilized due to poor  cervical extension. CRT repeated for a total of 3 treatments with no decreasing nystagmus noted during each intervention. Pt denies vertigo during second and third. Each position held for approximately 2 minutes. At next session will repeat testing, and perform CRT if necessary. Will complete BERG, DGI, and firm/foam balance testing once BPPV is resolved. Will issue VOR x 1 horizontal in sitting for HEP once BPPV is resolved.                            PT Education - 12/20/14 0955    Education provided Yes   Education Details Education regarding plan of care moving forward and desire to furnish HEP once appropriate   Person(s) Educated Patient   Methods Explanation   Comprehension Verbalized understanding             PT Long Term Goals - 12/09/14 1612    PT LONG TERM GOAL #1   Title Patient will increase Berg Balance score by > 6 points to demonstrate decreased fall risk during functional activities.   Time 6   Period Weeks   Status New   PT LONG TERM GOAL #2   Title Patient will increase ABC Scale score to > or = 67% demonstrating a decreased risk for falls.   Time 6   Period Weeks   Status New   PT LONG TERM GOAL #3   Title Patient will report <2/10 vertigo with provoking motions or positions.   Time 6   Period Weeks   Status New   PT LONG TERM GOAL #4   Title Patient demonstrates reduced falls risk as evidenced by > 21/24 on Dynamic Gait Index   Time 6   Period Weeks   Status New               Plan - 12/20/14 0924    Clinical Impression Statement Pt demonstrates 20 beat R upbeating torsional nystagmus with Marye Round. CRT performed x 3. Second bout pt with 5-10 beats of appropriate nystagmus in Hallpike position but denies syptoms. During third treatment 3-5 beats noted without symptoms. Will continue to treat R posterior canal BPPV until resolution at which point will progress to gaze stabilization program   PT Next Visit Plan Perform  BERG, DGI, and foam/firm eyes open/closed if [employer] negative. Check orthostatics. Progress to gaze stabilization program as appropriate.    PT Home Exercise Plan None provided today. At next session will provide VOR x 1 horizontal as appropriate once Marye Round testing is negative   Consulted and Agree with Plan of Care Patient        Problem List There are no active problems to display for this patient.  [name], PT DPT Huprich,Jason 12/20/2014, 11:24 AM  [place] MAIN [facility] SERVICES [address] [place], [place], [address] Phone: [phone]   Fax:  [phone]

## 2014-12-24 ENCOUNTER — Ambulatory Visit: Payer: Medicare Other | Admitting: Physical Therapy

## 2014-12-24 ENCOUNTER — Encounter: Payer: Self-pay | Admitting: Physical Therapy

## 2014-12-24 DIAGNOSIS — R2689 Other abnormalities of gait and mobility: Secondary | ICD-10-CM

## 2014-12-24 DIAGNOSIS — R42 Dizziness and giddiness: Secondary | ICD-10-CM

## 2014-12-24 DIAGNOSIS — H8111 Benign paroxysmal vertigo, right ear: Secondary | ICD-10-CM | POA: Diagnosis not present

## 2014-12-24 NOTE — Therapy (Signed)
[place] MAIN [facility] SERVICES [address] [place], [place], [address] Phone: [phone]   Fax:  [phone]  Physical Therapy Treatment  Patient Details  Name: Howard Anderson MRN: [id number] Date of Birth: 06-04-1946 Referring Provider:  Beverly Gust, MD  Encounter Date: 12/24/2014      PT End of Session - 12/24/14 1638    Visit Number 4   Number of Visits 13   Date for PT Re-Evaluation 01/20/15   Authorization Type g code every 10th visit   PT Start Time 1559   PT Stop Time 1628   PT Time Calculation (min) 29 min   Activity Tolerance Patient tolerated treatment well   Behavior During Therapy [facility] for tasks assessed/performed      Past Medical History  Diagnosis Date  . Hypertension   . Heart disease   . AAA (abdominal aortic aneurysm) without rupture   . Chest pain   . Shoulder fracture, right   . COPD (chronic obstructive pulmonary disease)     History reviewed. No pertinent past surgical history.  There were no vitals filed for this visit.  Visit Diagnosis:  Dizziness and giddiness  Balance problem      Subjective Assessment - 12/24/14 1634    Subjective Patient reports he has noticed improvements in his dizziness and light-headedness frequency since starting PT. Patient does not report any new falls, but does inquire how long he will continue to have "the crystals floating".    Pertinent History Patient is awaiting to have AAA repair surgery, however he is currently being treated for BPPV (right sided). Patient reports a dizziness of 3-4/10.    Patient Stated Goals "I would like to decrease my imbalance so I can have my AAA surgery and decrease my fear of falling"   Currently in Pain? No/denies       Canalith Repositioning Technique  Performed Canalith repositioning technique 3 times, with 2 minutes between each bout and 2 minutes between each position during the treatment. On initial (Right sided)   Dix-Hallpike, noted 16 beats (upbeating and torsional) were noted and patient was symptomatic. Patient was mildly symptomatic throughout the 1st CRT, which reduced on the second Dix-Hallpike (12 beats) and the 2nd CRT. No nystagmus or symptoms noted on third CRT. Patient reports he initially had a dizziness of 3-4 on presentation today, and had a dizziness of 0-1 afterwards.                           PT Education - 12/24/14 1637    Education provided Yes   Education Details Patient educated that his nystagmus with Dix-Hallpike is improving, but will need to continue to have CRT applied until it dissipates.    Person(s) Educated Patient   Methods Explanation   Comprehension Verbalized understanding             PT Long Term Goals - 12/24/14 1643    PT LONG TERM GOAL #1   Title Patient will increase Berg Balance score by > 6 points to demonstrate decreased fall risk during functional activities.   Time 6   Period Weeks   Status New   PT LONG TERM GOAL #2   Title Patient will increase ABC Scale score to > or = 67% demonstrating a decreased risk for falls.   Time 6   Period Weeks   Status New   PT LONG TERM GOAL #3   Title Patient will  report <2/10 vertigo with provoking motions or positions.   Time 6   Period Weeks   Status New   PT LONG TERM GOAL #4   Title Patient demonstrates reduced falls risk as evidenced by > 21/24 on Dynamic Gait Index   Time 6   Period Weeks   Status New               Plan - 12/24/14 1639    Clinical Impression Statement Patient initially demonstrates 16 beat nystagmus (upbeating torsional) with Dix-Hallpike. CRT performed 3x, with second bout demonstrating 12 beats, and third bout demonstrating no nystagmus or symptoms. Patient had dizziness on initial Dix-Hallpike, which reduced on second attempt, and resolved with third attempt. Patient would  continue to benefit from skilled PT services to treat R posterior BPPV until he  is asymptomatic and is appropriate for gaze stabilization.    Pt will benefit from skilled therapeutic intervention in order to improve on the following deficits Decreased balance   Rehab Potential Good   Clinical Impairments Affecting Rehab Potential positive: motivation, improving symptoms, negative: central signs, age   PT Frequency 2x / week   PT Duration 6 weeks   PT Treatment/Interventions Gait training;Neuromuscular re-education;Therapeutic activities;Therapeutic exercise;Balance training;Passive range of motion;Other (comment)   PT Next Visit Plan Perform BERG, DGI, and foam/firm eyes open/closed if [employer] negative. Check orthostatics. Progress to gaze stabilization program as appropriate. Perform Dix-Hallpike and CRT as needed.    PT Home Exercise Plan None provided today as patient continues to be symptomatic with Dix-Hallpike, if patient appropriate can begin VOR x 1.    Consulted and Agree with Plan of Care Patient        Problem List There are no active problems to display for this patient.  Kerman Passey, PT, DPT   12/24/2014, 4:45 PM  [place] MAIN [facility] SERVICES [address] [place], [place], [address] Phone: [phone]   Fax:  [phone]

## 2014-12-27 ENCOUNTER — Ambulatory Visit: Payer: Medicare Other

## 2014-12-27 ENCOUNTER — Encounter: Payer: Self-pay | Admitting: Physical Therapy

## 2014-12-27 DIAGNOSIS — R2689 Other abnormalities of gait and mobility: Secondary | ICD-10-CM

## 2014-12-27 DIAGNOSIS — H8111 Benign paroxysmal vertigo, right ear: Secondary | ICD-10-CM | POA: Diagnosis not present

## 2014-12-27 DIAGNOSIS — R42 Dizziness and giddiness: Secondary | ICD-10-CM

## 2014-12-27 NOTE — Therapy (Signed)
[place] MAIN [facility] SERVICES [address] [place], [place], [address] Phone: [phone]   Fax:  [phone]  Physical Therapy Treatment  Patient Details  Name: Howard Anderson MRN: [id number] Date of Birth: 1946-02-18 Referring Provider:  Beverly Gust, MD  Encounter Date: 12/27/2014      PT End of Session - 12/27/14 1023    Visit Number 5   Number of Visits 13   Date for PT Re-Evaluation 01/20/15   Authorization Type g code every 10th visit   PT Start Time 0930   PT Stop Time 1005   PT Time Calculation (min) 35 min   Equipment Utilized During Treatment Gait belt   Activity Tolerance Patient tolerated treatment well   Behavior During Therapy WFL for tasks assessed/performed      Past Medical History  Diagnosis Date  . Hypertension   . Heart disease   . AAA (abdominal aortic aneurysm) without rupture   . Chest pain   . Shoulder fracture, right   . COPD (chronic obstructive pulmonary disease)     History reviewed. No pertinent past surgical history.  There were no vitals filed for this visit.  Visit Diagnosis:  Dizziness and giddiness  Balance problem      Subjective Assessment - 12/27/14 1020    Subjective Pt reports that he noticed improvement in symptoms following his last therapy. However since the initial improvement no real change noted in his dizziness. Worst reported dizziness is 3/10. No specific questions or concerns at this time.    Patient is accompained by: Family member   Pertinent History Patient is awaiting to have AAA repair surgery, however he is currently being treated for BPPV (right sided).   Patient Stated Goals "I would like to decrease my imbalance so I can have my AAA surgery and decrease my fear of falling"   Currently in Pain? No/denies       TREATMENT:  Performed Canalith repositioning technique 2 times, with 2 minutes between each position during the treatment. On initial (Right sided)  Dix-Hallpike, noted 15 beats (upbeating and R torsional) were noted and patient was symptomatic. Pt was negative for symptoms or nystagmus during second CRT. Pt denies "dizziness" following intervention. After CRT completed BERG, DGI, and firm/foam testing with patient. Pt scored 52/56 on BERG with difficulty in single leg stance (<3 seconds). DGI: 22/24. Firm/foam reveals >30 seconds in all positions until eyes closed foam which was approximately 4 seconds. Pt issued single leg stance balance for HEP and will be provided handout at next session.                    [place] Adult PT Treatment/Exercise - 12/27/14 1023    High Level Balance   High Level Balance Comments Firm/Foam: >30 seconds firm eyes open/closed, foam 30 seconds eyes open, 4 seconds eyes closed                PT Education - 12/27/14 1022    Education provided Yes   Education Details Pt educated regarding progression of Epley until full resolution of symptoms. Pt provided with HEP for single leg stance balance at home. Will provide handout at next visit   Person(s) Educated Patient   Methods Explanation;Demonstration   Comprehension Verbalized understanding;Returned demonstration             PT Long Term Goals - 12/24/14 1643    PT LONG TERM GOAL #1   Title Patient will increase Merrilee Jansky  Balance score by > 6 points to demonstrate decreased fall risk during functional activities.   Time 6   Period Weeks   Status New   PT LONG TERM GOAL #2   Title Patient will increase ABC Scale score to > or = 67% demonstrating a decreased risk for falls.   Time 6   Period Weeks   Status New   PT LONG TERM GOAL #3   Title Patient will report <2/10 vertigo with provoking motions or positions.   Time 6   Period Weeks   Status New   PT LONG TERM GOAL #4   Title Patient demonstrates reduced falls risk as evidenced by > 21/24 on Dynamic Gait Index   Time 6   Period Weeks   Status New               Plan -  12/27/14 1024    Clinical Impression Statement Pt demonsrate 15 beats of upbeating R torsional nystagmus with Dix-Hallpike. CRT performed twice and during second bout pt is asymmptomatic without nystagmus. Pt demonstrates good balance scoring 52/56 on BERG with poor single leg stance. 22/24 on DGI. Pt is improving with decreased duration of nystagmus and severity of symptoms with Dix-Hallpike testing. Issued single leg balance for HEP and will provide handout at next session.    PT Next Visit Plan Check orthostatics. Progress to gaze stabilization program as appropriate. Perform Dix-Hallpike and CRT as needed.    PT Home Exercise Plan Single leg stance. Will issue handout at next visit.    Consulted and Agree with Plan of Care Patient        Problem List There are no active problems to display for this patient.  [name] PT, DPT   Huprich,Jason 12/27/2014, 10:47 AM  [place] MAIN [facility] SERVICES [address] [place], [place], [address] Phone: [phone]   Fax:  [phone]

## 2014-12-31 ENCOUNTER — Encounter: Payer: Self-pay | Admitting: Physical Therapy

## 2014-12-31 ENCOUNTER — Ambulatory Visit: Payer: Medicare Other

## 2014-12-31 DIAGNOSIS — H8111 Benign paroxysmal vertigo, right ear: Secondary | ICD-10-CM | POA: Diagnosis not present

## 2014-12-31 DIAGNOSIS — R2689 Other abnormalities of gait and mobility: Secondary | ICD-10-CM

## 2014-12-31 DIAGNOSIS — R42 Dizziness and giddiness: Secondary | ICD-10-CM

## 2014-12-31 NOTE — Therapy (Signed)
[place] MAIN [facility] SERVICES [address] [place], [place], [address] Phone: [phone]   Fax:  [phone]  Physical Therapy Treatment  Patient Details  Name: Howard Anderson MRN: [id number] Date of Birth: 1946/02/03 Referring Provider:  Beverly Gust, MD  Encounter Date: 12/31/2014      PT End of Session - 12/31/14 1446    Visit Number 6   Number of Visits 13   Date for PT Re-Evaluation 01/20/15   Authorization Type g code every 10th visit   PT Start Time 1350   PT Stop Time 1435   PT Time Calculation (min) 45 min   Equipment Utilized During Treatment Gait belt   Activity Tolerance Patient tolerated treatment well   Behavior During Therapy [facility] for tasks assessed/performed      Past Medical History  Diagnosis Date  . Hypertension   . Heart disease   . AAA (abdominal aortic aneurysm) without rupture   . Chest pain   . Shoulder fracture, right   . COPD (chronic obstructive pulmonary disease)     History reviewed. No pertinent past surgical history.  There were no vitals filed for this visit.  Visit Diagnosis:  Dizziness and giddiness  Balance problem      Subjective Assessment - 12/31/14 1359    Subjective Pt notes continued improvement in symptoms since last therapy session. He continues with some feelings of general imbalance. He has very infrequent vertiginous episodes. With respect to his imbalance pt states that he feels more "off balance" in the evenings compared to the mornings. He has been performing single leg balance at least two times/day since last therapy session.    Patient is accompained by: Family member   Pertinent History Patient is awaiting to have AAA repair surgery, however he is currently being treated for BPPV (right sided).   Limitations Walking   Patient Stated Goals "I would like to decrease my imbalance so I can have my AAA surgery and decrease my fear of falling"   Currently in Pain? No/denies          TREATMENT:  Performed Canalith repositioning technique 2 times, with 2 minutes between each position during the treatment. On initial (Right sided) Dix-Hallpike, noted 12 beats (upbeating and R torsional) were noted and patient was symptomatic. Pt was negative for symptoms or nystagmus during second CRT. Pt denies "dizziness" following intervention. After CRT performed NME activities with patient: single leg balance total of 30 sec/leg, tandem gait in parallel bars forward on 2x4 board; [employer] cone taps alternating LE x 10 each; Pt issued tandem gait to add to single leg stance balance for HEP. Will still plan on providing pt with VOR x 1 horizontal progression once BPPV is fully resolved.                          PT Education - 12/31/14 1401    Education provided Yes   Education Details Plan of care. Single leg balance and tandem gait handout (see patient instructions)   Person(s) Educated Patient   Methods Handout;Explanation   Comprehension Verbalized understanding             PT Long Term Goals - 12/24/14 1643    PT LONG TERM GOAL #1   Title Patient will increase Berg Balance score by > 6 points to demonstrate decreased fall risk during functional activities.   Time 6   Period Weeks   Status New  PT LONG TERM GOAL #2   Title Patient will increase ABC Scale score to > or = 67% demonstrating a decreased risk for falls.   Time 6   Period Weeks   Status New   PT LONG TERM GOAL #3   Title Patient will report <2/10 vertigo with provoking motions or positions.   Time 6   Period Weeks   Status New   PT LONG TERM GOAL #4   Title Patient demonstrates reduced falls risk as evidenced by > 21/24 on Dynamic Gait Index   Time 6   Period Weeks   Status New               Plan - 12/31/14 1403    Clinical Impression Statement Pt again demonstrates positive R Dix-hallpike but only 12 beats today and negative when tested after the first CRT. Pt  demonstrates continued deficits in single leg balance and tandem gait. Overall reaction time is impaired and general health/conditioning appears less than ideal. Will continue to perform Epley with patient until full resolution of BPPV is achieved. In addition will include balance training. Once BPPV is fully resolved will include VOR x 1 horizontal progression into therapy session and HEP.    PT Next Visit Plan Check orthostatics when appropriate. Progress to gaze stabilization program as appropriate. Perform Dix-Hallpike and CRT as needed.    PT Home Exercise Plan Single leg stance, tandem gait   Recommended Other Services No   Consulted and Agree with Plan of Care Patient        Problem List There are no active problems to display for this patient.  [name] PT, DPT   Huprich,Jason 12/31/2014, 2:51 PM  [place] MAIN [facility] SERVICES [address] [place], [place], [address] Phone: [phone]   Fax:  [phone]

## 2014-12-31 NOTE — Patient Instructions (Signed)
Single Leg Balance: Eyes Open   Stand on right leg with eyes open. Hold _30__ seconds. _2__ reps _3__ times per day. Repeat on other leg.  Copyright  VHI. All rights reserved.   Feet Heel-Toe "Tandem"   Arms outstretched, walk a straight line bringing one foot directly in front of the other. Walk the length of the counter top and back. Repeat 2 more laps per session. Do __3__ sessions per day.  Copyright  VHI. All rights reserved.

## 2015-01-03 ENCOUNTER — Encounter: Payer: Self-pay | Admitting: Physical Therapy

## 2015-01-03 ENCOUNTER — Ambulatory Visit: Payer: Medicare Other | Attending: Unknown Physician Specialty

## 2015-01-03 DIAGNOSIS — M25511 Pain in right shoulder: Secondary | ICD-10-CM | POA: Diagnosis not present

## 2015-01-03 DIAGNOSIS — R531 Weakness: Secondary | ICD-10-CM | POA: Diagnosis not present

## 2015-01-03 DIAGNOSIS — R2689 Other abnormalities of gait and mobility: Secondary | ICD-10-CM

## 2015-01-03 DIAGNOSIS — R42 Dizziness and giddiness: Secondary | ICD-10-CM

## 2015-01-03 NOTE — Therapy (Signed)
[place] MAIN [facility] SERVICES [address] [place], [place], [address] Phone: [phone]   Fax:  [phone]  Physical Therapy Treatment  Patient Details  Name: Howard Anderson MRN: [id number] Date of Birth: 07/07/1946 Referring Provider:  Beverly Gust, MD  Encounter Date: 01/03/2015      PT End of Session - 01/03/15 1204    Visit Number 7   Number of Visits 13   Date for PT Re-Evaluation 01/20/15   Authorization Type g code every 10th visit   PT Start Time 1105   PT Stop Time 1150   PT Time Calculation (min) 45 min   Equipment Utilized During Treatment Gait belt   Activity Tolerance Patient tolerated treatment well   Behavior During Therapy [facility] for tasks assessed/performed      Past Medical History  Diagnosis Date  . Hypertension   . Heart disease   . AAA (abdominal aortic aneurysm) without rupture   . Chest pain   . Shoulder fracture, right   . COPD (chronic obstructive pulmonary disease)     History reviewed. No pertinent past surgical history.  There were no vitals filed for this visit.  Visit Diagnosis:  Dizziness and giddiness  Balance problem      Subjective Assessment - 01/03/15 1110    Subjective Pt states that he is doing better. Pt states that he feels imbalanced still and that he has had a few episodes of spinning with standing up; states he has been doing his HEP.   Patient is accompained by: Family member   Pertinent History Patient is awaiting to have AAA repair surgery, however he is currently being treated for BPPV (right sided).   Limitations Walking   Patient Stated Goals "I would like to decrease my imbalance so I can have my AAA surgery and decrease my fear of falling"        TREATMENT:  Pt with negative Right Dix-Hallpike test with no nystagmus present and pt denied vertigo. Performed Right Canalith repositioning technique 1 time, with 2 minutes between each position during the treatment. Pt  did report mild increase in dizziness during head turn to the left during repositioning maneuver.  After CRT, performed orthostatic BP as follows:   In supine, 140/80 mmHg  In sitting, 115/62mmHg  In standing, 123/92 mmHg Pt educated about orthostatic hypotension: drink plenty of fluids and take time with positioning prior to leaving safe surface   Performed NME activities as follows:  Demonstrated VOR X1 and patient performed VOR X 1  2 reps of 30 seconds and then 3 reps of 1 minute each in sitting. Pt denied dizziness with VOR X1.  In // bars, performed  Heel toe walking on firm surface 6' times 1 and then on 2"X 4" board, performed 6' times 2 reps with occasional intermittent few finger touch for balance. Performed SLS 2-6 second L LE and 2-7 seconds R LE 3-5 reps per leg.                             PT Education - 01/03/15 1156    Education provided Yes   Education Details Reviewed HEP and POC; Provided VOR X 1 exercise for HEP   Person(s) Educated Patient   Methods Demonstration;Explanation;Handout;Tactile cues;Verbal cues   Comprehension Verbalized understanding             PT Long Term Goals - 12/24/14 1643    PT LONG  TERM GOAL #1   Title Patient will increase Berg Balance score by > 6 points to demonstrate decreased fall risk during functional activities.   Time 6   Period Weeks   Status New   PT LONG TERM GOAL #2   Title Patient will increase ABC Scale score to > or = 67% demonstrating a decreased risk for falls.   Time 6   Period Weeks   Status New   PT LONG TERM GOAL #3   Title Patient will report <2/10 vertigo with provoking motions or positions.   Time 6   Period Weeks   Status New   PT LONG TERM GOAL #4   Title Patient demonstrates reduced falls risk as evidenced by > 21/24 on Dynamic Gait Index   Time 6   Period Weeks   Status New               Plan - 01/03/15 1205    Clinical Impression Statement Pt with negative R  Dix-Hallpike test this date. Pt educated as to VOR X 1 in sitting for HEP and he needed vc and tactile cuing for technique with VOR exercise. Pt continues to demonstrate difficulty with standing balance activities. Will plan on reviewing VOR X1 next visit and continuing to work on progressions of gaze stabilization and dynamic balance exercises.    Pt will benefit from skilled therapeutic intervention in order to improve on the following deficits Decreased balance   Rehab Potential Good   Clinical Impairments Affecting Rehab Potential positive: motivation, improving symptoms, negative: central signs, age   PT Frequency 2x / week   PT Duration 6 weeks   PT Treatment/Interventions Gait training;Neuromuscular re-education;Therapeutic activities;Therapeutic exercise;Balance training;Passive range of motion;Other (comment)   PT Next Visit Plan Review and progress gaze stabilization program as appropriate. High level balance activities   PT Home Exercise Plan Single leg stance, tandem gait, gaze stabilization exercise (VOR X1)   Consulted and Agree with Plan of Care Patient        Problem List There are no active problems to display for this patient.  [name] PT, DPT Huprich,Jason 01/03/2015, 12:12 PM  [place] [address] [place], [place], [address] Phone: [phone]   Fax:  [phone]

## 2015-01-06 ENCOUNTER — Encounter: Payer: Self-pay | Admitting: Physical Therapy

## 2015-01-06 ENCOUNTER — Ambulatory Visit: Payer: Medicare Other

## 2015-01-06 DIAGNOSIS — R2689 Other abnormalities of gait and mobility: Secondary | ICD-10-CM

## 2015-01-06 DIAGNOSIS — M25511 Pain in right shoulder: Secondary | ICD-10-CM | POA: Diagnosis not present

## 2015-01-06 DIAGNOSIS — R42 Dizziness and giddiness: Secondary | ICD-10-CM

## 2015-01-06 DIAGNOSIS — R531 Weakness: Secondary | ICD-10-CM | POA: Diagnosis not present

## 2015-01-06 NOTE — Therapy (Signed)
[place] MAIN [facility] SERVICES [address] [place], [place], [address] Phone: [phone]   Fax:  [phone]  Physical Therapy Treatment  Patient Details  Name: Howard Anderson MRN: [id number] Date of Birth: 1946/02/25 Referring Provider:  Juline Patch, MD  Encounter Date: 01/06/2015      PT End of Session - 01/07/15 0902    Visit Number 8   Number of Visits 13   Date for PT Re-Evaluation 01/20/15   Authorization Type g code every 10th visit   PT Start Time 1530   PT Stop Time 1615   PT Time Calculation (min) 45 min   Equipment Utilized During Treatment Gait belt   Activity Tolerance Patient tolerated treatment well   Behavior During Therapy [facility] for tasks assessed/performed      Past Medical History  Diagnosis Date  . Hypertension   . Heart disease   . AAA (abdominal aortic aneurysm) without rupture   . Chest pain   . Shoulder fracture, right   . COPD (chronic obstructive pulmonary disease)     History reviewed. No pertinent past surgical history.  There were no vitals filed for this visit.  Visit Diagnosis:  Dizziness and giddiness  Balance problem      Subjective Assessment - 01/06/15 1543    Subjective Pt reports no changes since the last therapy session. He has been performing HEP but reports he had difficulty "finding enough hours in the day" to perform his exercises. Pt has no specific questions or concerns at this time. Pt states that this past weekend he "felt the best I have in a long time," with respect to his dizziness and balance issues. Pt very encouraged by the progress he is making.    Patient is accompained by: Family member   Pertinent History Patient is awaiting to have AAA repair surgery, however he is currently being treated for BPPV (right sided).   Patient Stated Goals "I would like to decrease my imbalance so I can have my AAA surgery and decrease my fear of falling"   Currently in Pain? No/denies       TREATMENT:  Pt with negative Right Dix-Hallpike test with no nystagmus present and pt denied vertigo. Performed NME activities as follows: Performed VOR X 1 horizontal 2 reps of 30 seconds in sitting (attempted 1 minute but form significantly deteriorates after 30 seconds and pt unable to correct), VOR x 1 horizontal Airex wide and narrow stance 30 sec x 2 each; Pt denied dizziness with VOR X 1 but does report some blurring of visual field on Airex; In // bars, performed Airex cone taps alternating LE; heel toe walking on Airex 6' x 4 performed with occasional intermittent few finger touch for balance. Performed SLS 2-6 second L LE and 2-7 seconds R LE 3-5 reps per leg. Ambulation in hallway with horizontal and vertical head turns on command. Discussion regarding POC and scheduling for R shoulder weakness/ROM deficits.                           PT Education - 01/07/15 0901    Education provided Yes   Education Details Reviewed HEP; no new exercises provided as pt currently struggles to perform VOR x 1 horizontal correctly. Discussed contacting his cardiologist to schedule AAA repair.    Person(s) Educated Patient   Methods Explanation;Demonstration;Tactile cues;Verbal cues   Comprehension Verbalized understanding;Returned demonstration  PT Long Term Goals - 12/24/14 1643    PT LONG TERM GOAL #1   Title Patient will increase Berg Balance score by > 6 points to demonstrate decreased fall risk during functional activities.   Time 6   Period Weeks   Status New   PT LONG TERM GOAL #2   Title Patient will increase ABC Scale score to > or = 67% demonstrating a decreased risk for falls.   Time 6   Period Weeks   Status New   PT LONG TERM GOAL #3   Title Patient will report <2/10 vertigo with provoking motions or positions.   Time 6   Period Weeks   Status New   PT LONG TERM GOAL #4   Title Patient demonstrates reduced falls risk as  evidenced by > 21/24 on Dynamic Gait Index   Time 6   Period Weeks   Status New               Plan - 01/07/15 6144    Clinical Impression Statement Pt with negative R Dix-Hallpike test this date. No symptoms and no nystagmus. At this point R posterior canal BPPV has completely resolved and pt has remained negative on L side. Pt was instructed to contact ENT regarding progress. In addition pt to discuss with ENT whether he can contact his cardiologist/vascular surgeon to schedule AAA repair. Pt does continue to demonstrate deficits in balance with single leg stance time of 3-5 seconds, foam balance with eyes closed of <5 seconds, and decreased gait speed with shuffling steps. Pt is at high risk for future falls and he would continue to benefit from progression to general balance program. In addition pt is scheduling therapy for his R shoulder as ordered by his orthopedist in order to regain as much strength and ROM as possible prior AAA repair. Pt will continue to benefit from skilled PT services to address the remaining deficits in balance, strength, and activitiy tolerance to decrease risk for future falls and improve function at home and work.    Pt will benefit from skilled therapeutic intervention in order to improve on the following deficits Decreased balance   Rehab Potential Good   PT Next Visit Plan Review VOR x 1 hoirzontal and progress to forward/retro ambulation. Continue to progress balance activities including eyes open/closed as well as foam and dynamic balance.   PT Home Exercise Plan Single leg stance, tandem gait, gaze stabilization exercise (VOR X1)   Consulted and Agree with Plan of Care Patient        Problem List There are no active problems to display for this patient.  [name] PT, DPT   Howard Anderson 01/07/2015, 9:16 AM  [place] MAIN [facility] SERVICES [address] [place], [place], [address] Phone: [phone]    Fax:  [phone]

## 2015-01-14 ENCOUNTER — Encounter: Payer: Self-pay | Admitting: Physical Therapy

## 2015-01-14 ENCOUNTER — Ambulatory Visit: Payer: Medicare Other | Admitting: Physical Therapy

## 2015-01-14 DIAGNOSIS — M25511 Pain in right shoulder: Secondary | ICD-10-CM

## 2015-01-14 DIAGNOSIS — R531 Weakness: Secondary | ICD-10-CM | POA: Diagnosis not present

## 2015-01-14 DIAGNOSIS — R29898 Other symptoms and signs involving the musculoskeletal system: Secondary | ICD-10-CM

## 2015-01-15 NOTE — Therapy (Signed)
[place] MAIN [facility] SERVICES [address] [place], [place], [address] Phone: [phone]   Fax:  [phone]  Physical Therapy Evaluation  Patient Details  Name: Howard Anderson MRN: [id number] Date of Birth: 1946/03/02 Referring Provider:  Thornton Park, MD  Encounter Date: 01/14/2015      PT End of Session - 01/15/15 1054    Visit Number 9  shoulder: 1    Number of Visits 13  Shoulder: 9    Date for PT Re-Evaluation 01/20/15  Shoulder: 02/11/2015   Authorization Type g code every 10th visit   PT Start Time 1545   PT Stop Time 1645   PT Time Calculation (min) 60 min   Equipment Utilized During Treatment --   Activity Tolerance Patient tolerated treatment well;No increased pain   Behavior During Therapy [facility] for tasks assessed/performed      Past Medical History  Diagnosis Date  . Hypertension   . Heart disease   . AAA (abdominal aortic aneurysm) without rupture   . Chest pain   . Shoulder fracture, right   . COPD (chronic obstructive pulmonary disease)     History reviewed. No pertinent past surgical history.  There were no vitals filed for this visit.  Visit Diagnosis:  Pain in joint, shoulder region, right - Plan: PT plan of care cert/re-cert  Weakness of shoulder - Plan: PT plan of care cert/re-cert      Subjective Assessment - 01/14/15 1557    Subjective Pt reports falling 07/26/2014. sustained R humerous fx. Pt notes that he has decreased ROM, roughtly shoulder height with slight increase in pain with flexion and abduction. Pt reports that he has had PT with poor compliance with HEP.     Pertinent History Pt is awaiting AAA repair surery, being seen for BPPV and shoulder pain loss of motion    Patient Stated Goals Increase ROM to allow participate in receational activities such as throwing a ball or swinging a golf club.    Multiple Pain Sites No            [facility] PT Assessment - 01/14/15 1607    Assessment    Medical Diagnosis R humreous fx.    Onset Date/Surgical Date 07/26/14   Hand Dominance Right   Next MD Visit unknown    Prior Therapy yes. [facility] 3 months prior. Notes slight benefit from PT towards shoulder.    Precautions   Precautions None   Restrictions   Weight Bearing Restrictions No   Balance Screen   Has the patient fallen in the past 6 months Yes   How many times? 1   Has the patient had a decrease in activity level because of a fear of falling?  No   Is the patient reluctant to leave their home because of a fear of falling?  No   Home Environment   Living Environment Private residence   Living Arrangements Spouse/significant other   Available Help at Discharge Family   Type of [place] to enter   Entrance Stairs-Number of Steps 4   Entrance Stairs-Rails Can reach both  self installed    Home Layout One level   [place] None   Prior Function   Level of Independence Independent with basic ADLs;Independent with gait;Independent with transfers   Vocation Full time employment   Observation/Other Assessments   Quick DASH  22% impaired.    Sensation   Light Touch Appears Intact  ROM / Strength   AROM / PROM / Strength AROM   AROM   Overall AROM Comments Functional ROM: pt able to only reach to R posterior lateral side of head, posterior R hip, and to anterior L shoulder . R shoulder osteokinematics greatly reduced in [facility] joint in R compared to L. AROM: IR and ER taken in supine with 90 degrees abduction, all other AROM taken in sitting.     Right Shoulder Extension 40 Degrees   Right Shoulder Flexion 100 Degrees   Right Shoulder ABduction 90 Degrees   Right Shoulder Internal Rotation 14 Degrees   Right Shoulder External Rotation 38 Degrees   Left Shoulder Extension 40 Degrees   Left Shoulder Flexion 160 Degrees   Left Shoulder ABduction 160 Degrees   Left Shoulder Internal Rotation 90 Degrees   Left Shoulder External  Rotation 80 Degrees   Strength   Overall Strength Comments Bilateral UE shoulder and elbow: L grossly 4- to 4/5. R: Grossly 4+/5 to 5/5    Right/Left Shoulder Left;Right   Right Shoulder Flexion 4/5   Right Shoulder Extension 4-/5   Right Shoulder ABduction 4/5   Right Shoulder Internal Rotation 4-/5   Right Shoulder External Rotation 4-/5   Left Shoulder Flexion 5/5   Left Shoulder Extension 4+/5   Left Shoulder ABduction 5/5   Left Shoulder Internal Rotation 4+/5   Left Shoulder External Rotation 4+/5   Right Hand Grip (lbs) 50#   Left Hand Grip (lbs) 45#   Special Tests    Special Tests Laxity/Instability Tests;Rotator Cuff Impingement   Hawkins-Kennedy test   Findings Negative   Side Right   Empty Can test   Findings Negative   Side Right   Full Can test   Findings Negative   Side Right                           PT Education - 01/15/15 1115    Education provided Yes   Education Details Plan of care, ROM needed for ADLs, GH osteokinematics, importance of HEP compliance.    Person(s) Educated Patient   Methods Explanation;Demonstration   Comprehension Verbalized understanding;Returned demonstration             PT Long Term Goals - 01/14/15 1713    PT LONG TERM GOAL #5   Title Patient will demonstrate >110 degrees of shoulder abduction in order to allow patient to perform bathing tasks with greater ease. 02/11/2015   Baseline 90   Time 4   Period Weeks   Status New   Additional Long Term Goals   Additional Long Term Goals Yes   PT LONG TERM GOAL #6   Title Pt will demonstrate >25 degrees IR to allow for greater ease of washing back while bathing. 02/11/2015   Baseline 14   Time 4   Period Weeks   PT LONG TERM GOAL #7   Title Increase shoulder flexion to >120 degrees to allow pt reach objects in cabinets at home. 02/11/2015   Baseline 100   Time 4   Period Weeks   PT LONG TERM GOAL #8   Title Decrease Quick dash by >5% point to  demonstrate reduced sense of disability. 02/11/2015   Baseline 23%   Time 4   Period Weeks               Plan - 01/14/15 1726    Clinical Impression Statement Pt presents to PT with decreased  shoulder motion secondary to r humerus fx on 07/26/2014. Lack of R shoulder motion prevents pt from reaching top of head or low back on R side with bathing and inability to reach posterion L shoulder. Pt's R shoulder has significant R shoulder joint hypomobility compared to L; all impingement tests negative. Pt reports that he has had PTfor his shoulder in prior months with some benefit. Pt continues to note dizziness with supine to sit, Skilled PT required to increase ROM  and strength to allow greater ease of ADLs.    Pt will benefit from skilled therapeutic intervention in order to improve on the following deficits Hypomobility;Decreased strength;Decreased range of motion;Pain   Rehab Potential Fair   Clinical Impairments Affecting Rehab Potential Positive: Understands benefit of PT. Negative: Chroninc shoulder condition, history of poor HEP compliance.    PT Frequency 2x / week   PT Duration 4 weeks   PT Treatment/Interventions ADLs/Self Care Home Management;Electrical Stimulation;Moist Heat;Ultrasound;Cryotherapy;Functional mobility training;Therapeutic activities;Therapeutic exercise;Neuromuscular re-education;Manual techniques;Scar mobilization;Passive range of motion;Dry needling   PT Next Visit Plan Manual therapy, shoulder strengthening.    PT Home Exercise Plan will address at next PT visit.    Consulted and Agree with Plan of Care Patient         Problem List There are no active problems to display for this patient.   Hopkins,Takerra Lupinacci 01/15/2015, 11:30 AM  [place] MAIN [facility] SERVICES [address] [place], [place], [address] Phone: [phone]   Fax:  [phone]

## 2015-01-16 ENCOUNTER — Ambulatory Visit: Payer: Medicare Other

## 2015-01-16 ENCOUNTER — Encounter: Payer: Self-pay | Admitting: Physical Therapy

## 2015-01-16 DIAGNOSIS — R2689 Other abnormalities of gait and mobility: Secondary | ICD-10-CM

## 2015-01-16 DIAGNOSIS — R42 Dizziness and giddiness: Secondary | ICD-10-CM

## 2015-01-16 DIAGNOSIS — M25511 Pain in right shoulder: Secondary | ICD-10-CM | POA: Diagnosis not present

## 2015-01-16 DIAGNOSIS — R531 Weakness: Secondary | ICD-10-CM | POA: Diagnosis not present

## 2015-01-16 NOTE — Therapy (Signed)
[place] MAIN [facility] SERVICES [address] [place], [place], [address] Phone: [phone]   Fax:  [phone]  Physical Therapy Treatment  Patient Details  Name: Howard Anderson MRN: [id number] Date of Birth: 11/11/1945 Referring Provider:  Beverly Gust, MD  Encounter Date: 01/16/2015      PT End of Session - 01/16/15 1501    Visit Number 10   Number of Visits 13   Date for PT Re-Evaluation 01/20/15   Authorization Type g code every 10th visit   PT Start Time 1400   PT Stop Time 1445   PT Time Calculation (min) 45 min   Equipment Utilized During Treatment Gait belt   Activity Tolerance Patient tolerated treatment well   Behavior During Therapy [facility] for tasks assessed/performed      Past Medical History  Diagnosis Date  . Hypertension   . Heart disease   . AAA (abdominal aortic aneurysm) without rupture   . Chest pain   . Shoulder fracture, right   . COPD (chronic obstructive pulmonary disease)     History reviewed. No pertinent past surgical history.  There were no vitals filed for this visit.  Visit Diagnosis:  Dizziness and giddiness  Balance problem      Subjective Assessment - 01/16/15 1406    Subjective Pt states that he still feels like he is having some dizziness and imbalance. He is performing his HEP once every 2-3 days. He has a follow-up appointment with cardiology sometime this month. He is hoping to have his AAA repair in July 2016.    Patient is accompained by: Family member   Pertinent History Patient is awaiting to have AAA repair surgery, however he is currently being treated for BPPV (right sided).   Limitations Walking   Patient Stated Goals "I would like to decrease my imbalance so I can have my AAA surgery and decrease my fear of falling"   Currently in Pain? No/denies   Multiple Pain Sites No      OBJECTIVE:  NuStep L3 x 5 min during history (3 min unbilled); Pt with positive Right  Dix-Hallpike test with 15 beat of upward R torsional nystagmus and symptomatic vertigo. Pt treated with Epley x2 and second bout pt is negative for vertigo or nystagmus in the [facility] position for R posterior canal.  Following CRT performed NME activities as follows: In // bars, performed Airex narrow stance balance with eyes closed; Airex marching with eyes closed; tandem gait on 2 x 4 for 2 laps; heel toe walking on Airex 6' x 4 performed with occasional intermittent few finger touch for balance.                           PT Education - 01/16/15 1500    Education provided Yes   Education Details Plan of care, HEP reinforcement   Person(s) Educated Patient   Methods Explanation;Demonstration   Comprehension Verbalized understanding;Returned demonstration             PT Long Term Goals - 01/16/15 1506    PT LONG TERM GOAL #1   Title Patient will increase Berg Balance score by > 6 points to demonstrate decreased fall risk during functional activities.   Time 6   Period Weeks   Status New   PT LONG TERM GOAL #2   Title Patient will increase ABC Scale score to > or = 67% demonstrating a decreased risk for falls.  Time 6   Period Weeks   Status Partially Met   PT LONG TERM GOAL #3   Title Patient will report <2/10 vertigo with provoking motions or positions.   Time 6   Period Weeks   Status Partially Met   PT LONG TERM GOAL #4   Title Patient demonstrates reduced falls risk as evidenced by > 21/24 on Dynamic Gait Index   Time 6   Period Weeks   Status New               Plan - 2015/02/15 1503    Clinical Impression Statement Pt was positive again with Marye Round testing today for R side posterior canal BPPV. Pt responded well to Epley with complete resolution of symptoms. Pt has not been consistent with HEP so therapist encouragement frequent performance of HEP. Will continue to assess for BPPV if symptoms return otherwise will continue with  balance progression.    Pt will benefit from skilled therapeutic intervention in order to improve on the following deficits Decreased balance   Rehab Potential Good   PT Next Visit Plan Reassess R side posterior canal BPPV with Marye Round and treat as necessary. Review VOR x 1 hoirzontal and progress to forward/retro ambulation. Continue to progress balance activities including eyes open/closed as well as foam and dynamic balance.   PT Home Exercise Plan Single leg stance, tandem gait, gaze stabilization exercise (VOR X1)   Consulted and Agree with Plan of Care Patient          G-Codes - 2015-02-15 1508    Functional Assessment Tool Used Clinical judgement   Functional Limitation Mobility: Walking and moving around   Mobility: Walking and Moving Around Current Status [phone]) At least 20 percent but less than 40 percent impaired, limited or restricted   Mobility: Walking and Moving Around Goal Status [phone]) At least 1 percent but less than 20 percent impaired, limited or restricted      Problem List There are no active problems to display for this patient.  [name] PT, DPT   Finas Delone 02-15-15, 4:28 PM  [employer] [employer] MAIN [facility] SERVICES [address] [place], [place], [address] Phone: [phone]   Fax:  [phone]

## 2015-01-20 ENCOUNTER — Ambulatory Visit: Payer: Medicare Other | Admitting: Physical Therapy

## 2015-01-20 ENCOUNTER — Encounter: Payer: Self-pay | Admitting: Physical Therapy

## 2015-01-20 DIAGNOSIS — I714 Abdominal aortic aneurysm, without rupture: Secondary | ICD-10-CM | POA: Diagnosis not present

## 2015-01-20 DIAGNOSIS — M25511 Pain in right shoulder: Secondary | ICD-10-CM

## 2015-01-20 DIAGNOSIS — R42 Dizziness and giddiness: Secondary | ICD-10-CM | POA: Diagnosis not present

## 2015-01-20 DIAGNOSIS — I10 Essential (primary) hypertension: Secondary | ICD-10-CM | POA: Diagnosis not present

## 2015-01-20 DIAGNOSIS — I38 Endocarditis, valve unspecified: Secondary | ICD-10-CM | POA: Insufficient documentation

## 2015-01-20 DIAGNOSIS — R29898 Other symptoms and signs involving the musculoskeletal system: Secondary | ICD-10-CM

## 2015-01-20 DIAGNOSIS — R531 Weakness: Secondary | ICD-10-CM | POA: Diagnosis not present

## 2015-01-20 NOTE — Patient Instructions (Addendum)
ROM: Flexion - Wand   Bring wand directly over head, leading with right side. Reach back until stretch is felt. Hold _2___ seconds. Repeat _10___ times per set. Do __1__ sets per session. Do _2___ sessions per day.  http://orth.exer.us/744   Copyright  VHI. All rights reserved.  Abduction (Eccentric) - Active-Assist (Cane)   Use  Left arm to push right arm out to side. Avoid hiking shoulder. Keep palm relaxed. Slowly lower affected arm for 3-5 seconds. _10__ reps per set, __2 sets per day, __5_ days per week. Try to increase use of affected arm during lift/lowering.  Copyright  VHI. All rights reserved.  Strengthening: Resisted Extension   Hold tubing in right hand, arm forward. Pull arm back, elbow straight and then relax letting arm raise overhead. Repeat 10____ times per set. Do _1___ sets per session. Do __2__ sessions per day.  http://orth.exer.us/832   Copyright  VHI. All rights reserved.  Strengthening: Resisted Adduction   Hold tubing in right hand, arm out. Pull arm toward side and then relax and let arm come up as high as possible. Repeat _10___ times per set. Do _1___ sets per session. Do __2__ sessions per day.  http://orth.exer.us/834   Copyright  VHI. All rights reserved.  Roll   Inhale and bring shoulders up, back, then exhale and relax shoulders down. Repeat 10___ times. Do _2__ times per day.  Copyright  VHI. All rights reserved.

## 2015-01-21 NOTE — Therapy (Signed)
[place] MAIN [facility] SERVICES [address] [place], [place], [address] Phone: [phone]   Fax:  [phone]  Physical Therapy Treatment  Patient Details  Name: Howard Anderson MRN: [id number] Date of Birth: 1946-04-24 Referring Provider:  Thornton Park, MD  Encounter Date: 01/20/2015      PT End of Session - 01/21/15 0925    Visit Number 11  shoulder: 2   Number of Visits 13  Shoulder: 9    Date for PT Re-Evaluation 01/20/15  02/11/2015   Authorization Type g code every 10th visit   PT Start Time 1646   PT Stop Time 1730   PT Time Calculation (min) 44 min   Activity Tolerance Patient tolerated treatment well;Patient limited by pain   Behavior During Therapy [facility] for tasks assessed/performed      Past Medical History  Diagnosis Date  . Hypertension   . Heart disease   . AAA (abdominal aortic aneurysm) without rupture   . Chest pain   . Shoulder fracture, right   . COPD (chronic obstructive pulmonary disease)     History reviewed. No pertinent past surgical history.  There were no vitals filed for this visit.  Visit Diagnosis:  Pain in joint, shoulder region, right  Weakness of shoulder      Subjective Assessment - 01/20/15 1655    Subjective Patient reports still having trouble lifting his Right arm by himself; He reports that the pain isn't too bad today; He reports that he is still having some dizziness;    Pertinent History Pt is awaiting AAA repair surery, being seen for BPPV and shoulder pain loss of motion    Patient Stated Goals Increase ROM to allow participate in receational activities such as throwing a ball or swinging a golf club.    Currently in Pain? No/denies            [facility] PT Assessment - 01/20/15 1729    AROM   Right Shoulder Flexion 113 Degrees   Right Shoulder ABduction 90 Degrees   Right Shoulder Internal Rotation 21 Degrees   Right Shoulder External Rotation 37 Degrees        TREATMENT: Warm up on UBE level 1 backwards only x4 min (unbilled);   Initiated HEP: BUE wand flexion x10; RUE wand shoulder abduction AAROM x10; RUE green tband: shoulder extension with flexion assist x10, shoulder adduction with abduction assist x10; Posterior shoulder rolls x15; Patient required min-moderate verbal/tactile cues for correct exercise technique including cues for better positioning to improve ROM; Patient required min Vcs to keep elbow straight during wand/tband exercise for increased shoulder ROM;  PT performed PROM of RUE shoulder in all directions 2x5 reps PT performed grade II-III inferior, posterior and anterior glenohumeral joint mobs, 10 sec bouts x4 each;  PT reassessed AROM- see attached, he demonstrates improved shoulder flexion and IR as compared to initial eval;                      PT Education - 01/21/15 0925    Education provided Yes   Education Details Advanced HEP- see patient instructions   Person(s) Educated Patient   Methods Explanation;Demonstration;Verbal cues;Handout   Comprehension Verbalized understanding;Returned demonstration;Verbal cues required             PT Long Term Goals - 01/21/15 0927    PT LONG TERM GOAL #5   Title Patient will demonstrate >110 degrees of shoulder abduction in order to allow patient to  perform bathing tasks with greater ease. 02/11/2015   Baseline 90   Time 4   Period Weeks   Status New   PT LONG TERM GOAL #6   Title Pt will demonstrate >25 degrees IR to allow for greater ease of washing back while bathing. 02/11/2015   Baseline 14   Time 4   Period Weeks   PT LONG TERM GOAL #7   Title Increase shoulder flexion to >120 degrees to allow pt reach objects in cabinets at home. 02/11/2015   Baseline 100   Time 4   Period Weeks   PT LONG TERM GOAL #8   Title Decrease Quick dash by >5% point to demonstrate reduced sense of disability. 02/11/2015   Baseline 23%   Time 4   Period Weeks                Plan - 01/21/15 4827    Clinical Impression Statement Patient was instructed in HEP for RUE AAROM; He required cues to avoid shoulder elevation during shoulder flexion/abduction. Patient reports increased pain during partial ROM however no pain at rest. He responded well to manual therapy with overall improved ROM as compared to initial eval;    Pt will benefit from skilled therapeutic intervention in order to improve on the following deficits Hypomobility;Decreased strength;Decreased range of motion;Pain   Rehab Potential Fair   Clinical Impairments Affecting Rehab Potential Positive: Understands benefit of PT. Negative: Chroninc shoulder condition, history of poor HEP compliance.    PT Frequency 2x / week   PT Duration 4 weeks   PT Treatment/Interventions ADLs/Self Care Home Management;Electrical Stimulation;Moist Heat;Ultrasound;Cryotherapy;Functional mobility training;Therapeutic activities;Therapeutic exercise;Neuromuscular re-education;Manual techniques;Scar mobilization;Passive range of motion;Dry needling   PT Next Visit Plan Manual therapy, shoulder strengthening.    PT Home Exercise Plan see patient instructions   Consulted and Agree with Plan of Care Patient        Problem List There are no active problems to display for this patient.   Hopkins,Raja Caputi, PT, DPT 01/21/2015, 9:28 AM  [place] MAIN [facility] SERVICES [address] [place], [place], [address] Phone: [phone]   Fax:  [phone]

## 2015-01-22 ENCOUNTER — Ambulatory Visit: Payer: Medicare Other | Admitting: Physical Therapy

## 2015-01-22 ENCOUNTER — Encounter: Payer: Self-pay | Admitting: Physical Therapy

## 2015-01-22 DIAGNOSIS — R531 Weakness: Secondary | ICD-10-CM | POA: Diagnosis not present

## 2015-01-22 DIAGNOSIS — R29898 Other symptoms and signs involving the musculoskeletal system: Secondary | ICD-10-CM

## 2015-01-22 DIAGNOSIS — M25511 Pain in right shoulder: Secondary | ICD-10-CM | POA: Diagnosis not present

## 2015-01-22 NOTE — Therapy (Signed)
[place] MAIN [facility] SERVICES [address] [place], [place], [address] Phone: [phone]   Fax:  [phone]  Physical Therapy Treatment  Patient Details  Name: Howard Anderson MRN: [id number] Date of Birth: 1945-08-03 Referring Provider:  Juline Patch, MD  Encounter Date: 01/22/2015      PT End of Session - 01/22/15 1744    Visit Number 12  shoulder: 3   Number of Visits 13  Shoulder: 9   Date for PT Re-Evaluation 01/20/15  02/11/2015   Authorization Type g code every 10th visit   PT Start Time 1645   PT Stop Time 1733   PT Time Calculation (min) 48 min   Activity Tolerance Patient tolerated treatment well;Patient limited by pain   Behavior During Therapy [facility] for tasks assessed/performed      Past Medical History  Diagnosis Date  . Hypertension   . Heart disease   . AAA (abdominal aortic aneurysm) without rupture   . Chest pain   . Shoulder fracture, right   . COPD (chronic obstructive pulmonary disease)     History reviewed. No pertinent past surgical history.  There were no vitals filed for this visit.  Visit Diagnosis:  Pain in joint, shoulder region, right  Weakness of shoulder      Subjective Assessment - 01/22/15 1653    Subjective (p) Pt reports that he does not feel like he has gained any motion in his Right arm, reports that he has had time to complete his shoulder HEP since last PT visit. No pain reported today but notes that he had a dizzy spell this AM.    Pertinent History (p) Pt is awaiting AAA repair surery, being seen for BPPV and shoulder pain loss of motion    Limitations (p) Lifting;Walking   Patient Stated Goals (p) Increase ROM to allow participate in receational activities such as throwing a ball or swinging a golf club.    Currently in Pain? (p) No/denies          Treatment  HEP review:   AAROM with cane for shoulder flexion  x10 AAROM with cane for shoulder abdx10 Shoulder roll  x10 AAROM/eccentric strengthening shoulder flexion with tband x10 AAROM/eccentric strengthening shoulder abduction with tband x10   Shoulder protraction with blue tband x12 Low row blue tband x12 Scapular retraction with elbows straight blue tband x12  Patient required moderate verbal instruction and constant tactile cueing to perform correct exercise techniques; instruction included proper UE positioning for all exercises, decreased back movement for compensation of decreased shoulder ROM, proper sequencing of movement for all tband exercises.   Manual therapy PROM for shoulder flexion, abd, IR and ER 3 bouts of 30 second hold for each motion.  Grade III mobilization for PA, AP, and inferior of the [place] joint.  3 bouts of 30 seconds for each motion.   Pt tolerated all manual therapy well and reports tight feeling in [facility] joint at end range PROM, but no increase in pain.                     PT Education - 01/22/15 1740    Education provided Yes   Education Details advanced HEP, additional therapeutic exercise.    Person(s) Educated Patient   Methods Explanation;Demonstration;Tactile cues;Verbal cues   Comprehension Verbalized understanding;Returned demonstration;Verbal cues required;Tactile cues required             PT Long Term Goals - 01/21/15 8101  PT LONG TERM GOAL #5   Title Patient will demonstrate >110 degrees of shoulder abduction in order to allow patient to perform bathing tasks with greater ease. 02/11/2015   Baseline 90   Time 4   Period Weeks   Status New   PT LONG TERM GOAL #6   Title Pt will demonstrate >25 degrees IR to allow for greater ease of washing back while bathing. 02/11/2015   Baseline 14   Time 4   Period Weeks   PT LONG TERM GOAL #7   Title Increase shoulder flexion to >120 degrees to allow pt reach objects in cabinets at home. 02/11/2015   Baseline 100   Time 4   Period Weeks   PT LONG TERM GOAL #8   Title Decrease Quick dash by  >5% point to demonstrate reduced sense of disability. 02/11/2015   Baseline 23%   Time 4   Period Weeks               Plan - 01/22/15 1746    Clinical Impression Statement Pt was instructed in HEP review for RUE and educated in increased HEP. Cues were provided to decrease shoulder shrug, limit accessory back movement with AAROM, and proper setup for t band therex. Pt reports slight increase in pain with AAROM and PROM at end range, but pain relieves with rest and out of end range. Continued skilled PT is recommended to increase ROM and stregth to improve function with ADLs.    Pt will benefit from skilled therapeutic intervention in order to improve on the following deficits Hypomobility;Decreased strength;Decreased range of motion;Pain   Rehab Potential Fair   Clinical Impairments Affecting Rehab Potential Positive: Understands benefit of PT. Negative: Chroninc shoulder condition, history of poor HEP compliance.    PT Frequency 2x / week   PT Duration 4 weeks   PT Treatment/Interventions ADLs/Self Care Home Management;Electrical Stimulation;Moist Heat;Ultrasound;Cryotherapy;Functional mobility training;Therapeutic activities;Therapeutic exercise;Neuromuscular re-education;Manual techniques;Scar mobilization;Passive range of motion;Dry needling   PT Next Visit Plan Manual therapy, shoulder strengthening.    PT Home Exercise Plan see patient instructions   Consulted and Agree with Plan of Care Patient        Problem List There are no active problems to display for this patient.   Barrie Folk SPT 01/23/2015   9:57 AM   Hopkins,Margaret,PT, DPT 01/23/2015, 9:57 AM  [place] MAIN [facility] SERVICES [address] [place], [place], [address] Phone: [phone]   Fax:  [phone]

## 2015-01-22 NOTE — Patient Instructions (Addendum)
Low Row: Standing   Face anchor, feet shoulder width apart. Palms up, pull arms back, squeezing shoulder blades together. Repeat __ times per set. Do __ sets per session. Do __ sessions per week. Anchor Height: Waist  http://tub.exer.us/65   Copyright  VHI. All rights reserved.  Scapular: Protraction - 90 of Flexion   Holding ____ pound weights, attempt to push arms up toward ceiling, keeping elbows straight and back against floor. Repeat ____ times per set. Do ____ sets per session. Do ____ sessions per day.  http://orth.exer.us/856   Copyright  VHI. All rights reserved.

## 2015-01-27 ENCOUNTER — Ambulatory Visit: Payer: Medicare Other | Admitting: Physical Therapy

## 2015-01-27 ENCOUNTER — Ambulatory Visit: Payer: Medicare Other

## 2015-01-27 ENCOUNTER — Encounter: Payer: Self-pay | Admitting: Physical Therapy

## 2015-01-27 DIAGNOSIS — R2689 Other abnormalities of gait and mobility: Secondary | ICD-10-CM

## 2015-01-27 DIAGNOSIS — R29898 Other symptoms and signs involving the musculoskeletal system: Secondary | ICD-10-CM

## 2015-01-27 DIAGNOSIS — R42 Dizziness and giddiness: Secondary | ICD-10-CM

## 2015-01-27 DIAGNOSIS — M25511 Pain in right shoulder: Secondary | ICD-10-CM

## 2015-01-27 DIAGNOSIS — R531 Weakness: Secondary | ICD-10-CM | POA: Diagnosis not present

## 2015-01-27 NOTE — Therapy (Signed)
[place] MAIN [facility] SERVICES [address] [place], [place], [address] Phone: [phone]   Fax:  [phone]  Physical Therapy Treatment  Patient Details  Name: Howard Anderson MRN: [id number] Date of Birth: 1946-04-02 Referring Provider:  Juline Patch, MD  Encounter Date: 01/27/2015      PT End of Session - 01/27/15 1420    Visit Number 13  shoulder:4   Number of Visits 13  shoulder :9   Date for PT Re-Evaluation 01/20/15   Authorization Type g code every 10th visit      Past Medical History  Diagnosis Date  . Hypertension   . Heart disease   . AAA (abdominal aortic aneurysm) without rupture   . Chest pain   . Shoulder fracture, right   . COPD (chronic obstructive pulmonary disease)     History reviewed. No pertinent past surgical history.  There were no vitals filed for this visit.  Visit Diagnosis:  Pain in joint, shoulder region, right  Weakness of shoulder      Subjective Assessment - 01/27/15 1419    Subjective Patient continues to have difficulty with right arm ROM and strength .   Patient is accompained by: Family member   Pertinent History Pt is awaiting AAA repair surery, being seen for BPPV and shoulder pain loss of motion    Limitations Walking   Patient Stated Goals Increase ROM to allow participate in receational activities such as throwing a ball or swinging a golf club.    Multiple Pain Sites No     Pulley x 5 minutes sitting  Right shoulder ROM is 37 deg ER, abd 0-100 deg  In supine position AAROM to right shoulder with supine flex x 15 x 2 AROM shoulder press in supine towards ceiling x 15 x 2 Contract/relax IR and ER x 10 x 2 Supine flexion horizontal abd/add x 10 x 2 with cane Ball around the door frame x 5 x2 Manual therapy to right shoulder including inferior glides grade 2 and 3 for 30 bouts in 80 deg, 90 deg and 100 deg abd Manual therapy anterior  and posterior glides grade 2 x 30 reps x  2 sets Finger ladder exercise x 2 Patient has discomfort in right shoulder PROM/AROM above 100 deg shoulder abd and past 40 deg ER                         PT Education - 01/27/15 1420    Education provided Yes   Education Details progressed HEP   Person(s) Educated Patient   Methods Explanation   Comprehension Verbalized understanding;Returned demonstration             PT Long Term Goals - 01/21/15 0927    PT LONG TERM GOAL #5   Title Patient will demonstrate >110 degrees of shoulder abduction in order to allow patient to perform bathing tasks with greater ease. 02/11/2015   Baseline 90   Time 4   Period Weeks   Status New   PT LONG TERM GOAL #6   Title Pt will demonstrate >25 degrees IR to allow for greater ease of washing back while bathing. 02/11/2015   Baseline 14   Time 4   Period Weeks   PT LONG TERM GOAL #7   Title Increase shoulder flexion to >120 degrees to allow pt reach objects in cabinets at home. 02/11/2015   Baseline 100   Time 4   Period  Weeks   PT LONG TERM GOAL #8   Title Decrease Quick dash by >5% point to demonstrate reduced sense of disability. 02/11/2015   Baseline 23%   Time 4   Period Weeks               Plan - 01/27/15 1426    Clinical Impression Statement Patient has weakness in RUE -3/5 shoulder flex and is able to perform supine exericses with cane and has no increased pain. Instructed in pulley exercises. Patient was progressed with his HEP and will continue to benefit from skileld PT to imrpove strenght and  ROM.    Pt will benefit from skilled therapeutic intervention in order to improve on the following deficits Hypomobility;Decreased strength;Decreased range of motion;Pain   Clinical Impairments Affecting Rehab Potential Positive: Understands benefit of PT. Negative: Chroninc shoulder condition, history of poor HEP compliance.    PT Frequency 2x / week   PT Duration 4 weeks   PT Treatment/Interventions ADLs/Self  Care Home Management;Electrical Stimulation;Moist Heat;Ultrasound;Cryotherapy;Functional mobility training;Therapeutic activities;Therapeutic exercise;Neuromuscular re-education;Manual techniques;Scar mobilization;Passive range of motion;Dry needling   PT Next Visit Plan Manual therapy, shoulder strengthening.    PT Home Exercise Plan see patient instructions   Recommended Other Services no   Consulted and Agree with Plan of Care Patient        Problem List There are no active problems to display for this patient.   Alanson Puls 01/27/2015, 2:33 PM  [place] MAIN [facility] SERVICES [address] [place], [place], [address] Phone: [phone]   Fax:  [phone]

## 2015-01-27 NOTE — Therapy (Signed)
[place] MAIN [facility] SERVICES [address] [place], [place], [address] Phone: [phone]   Fax:  [phone]  Physical Therapy Treatment  Patient Details  Name: Howard Anderson MRN: [id number] Date of Birth: [birth year] Referring Provider:  Beverly Gust, MD  Encounter Date: 01/27/2015      PT End of Session - 01/27/15 1508    Visit Number 13  shoulder: 3   Number of Visits 19  Shoulder: 9   Date for PT Re-Evaluation 03/10/15  02/11/2015   Authorization Type g code every 10th visit   Authorization - Visit Number 10   Authorization - Number of Visits 19   PT Start Time 1500   PT Stop Time 1545   PT Time Calculation (min) 45 min   Equipment Utilized During Treatment Gait belt   Activity Tolerance Patient tolerated treatment well;Patient limited by pain   Behavior During Therapy WFL for tasks assessed/performed      Past Medical History  Diagnosis Date  . Hypertension   . Heart disease   . AAA (abdominal aortic aneurysm) without rupture   . Chest pain   . Shoulder fracture, right   . COPD (chronic obstructive pulmonary disease)     History reviewed. No pertinent past surgical history.  There were no vitals filed for this visit.  Visit Diagnosis:  Dizziness and giddiness - Plan: PT plan of care cert/re-cert  Balance problem - Plan: PT plan of care cert/re-cert      Subjective Assessment - 01/27/15 1505    Subjective Pt states that his symptoms are improving slowly. He has still had some episodes of vertigo and it mostly occurs when first sitting up in the morning. He is performing HEP without issue. No specific quesitons or concerns currently   Patient is accompained by: Family member   Pertinent History Pt is awaiting AAA repair surery, being seen for BPPV and balance difficulty   Limitations Lifting;Walking   Patient Stated Goals Decrease dizziness in order to maintain full function at home and work.    Currently in  Pain? No/denies         OBJECTIVE: Dix-Hallpike negative bilaterally. Repeated outcome measures with patient: BERG and DGI. 75m gait speed: 1.0 m/s (self-selected); TUG Cognitive: 9 seconds; Pt completed ABC (unbilled); Goals updated with patient;  VOR x 1 horizontal Airex narrow stance 1 min x 2; VOR x 1 horizontal forward/retro ambulation 1 min x 2; Ambulation in hallway with vertical and horizontal ball toss. Educated regarding plan of care.           PT Education - 01/27/15 1507    Education provided Yes   Education Details reinforced HEP. Updated plan of care and goals   Person(s) Educated Patient   Methods Explanation;Demonstration   Comprehension Verbalized understanding;Returned demonstration             PT Long Term Goals - 01/27/15 1510    PT LONG TERM GOAL #1   Title Patient will increase Berg Balance score by > 6 points to demonstrate decreased fall risk during functional activities by 01/20/15   Baseline 01/27/15: 50/56   Time 6   Period Weeks   Status Achieved   PT LONG TERM GOAL #2   Title Patient will increase ABC Scale score to > or = 67% demonstrating a decreased risk for falls by 01/20/15   Baseline 01/27/15: 75.63%   Time 6   Period Weeks   Status Achieved   PT LONG TERM  GOAL #3   Title Patient will report <2/10 vertigo with provoking motions or positions by 03/10/15   Baseline 01/27/15: Worst 7/10   Time 6   Period Weeks   Status On-going   PT LONG TERM GOAL #4   Title Patient demonstrates reduced falls risk as evidenced by > 21/24 on Dynamic Gait Index by 01/20/15   Baseline 01/27/15 23/24   Time 6   Period Weeks   Status Achieved   Additional Long Term Goals   Additional Long Term Goals Yes   PT LONG TERM GOAL  #9   TITLE Pt will increase self-selected gait speed to >1.2 m/s for full community ambulation by 03/10/15   Baseline 01/27/15: 1.0 m/s   Status New   PT LONG TERM GOAL  #10   TITLE Pt will improve single leg stance time to >14 seconds  in order to decrease fall risk by 03/10/15   Baseline 01/27/15: approximately 7-8 seconds   Status New               Plan - 01/27/15 1529    Clinical Impression Statement Pt has demonstrated considerable improvement since starting therapy. His BPPV has completely resolved but he still complains of some episodes of vertigo and imbalance. Overall his balance is impairmed with decreased self-selected gait speed and impaired single leg stance balance. Pt would benefit from continued vestibular therapy for an additional 6 weeks in order to improve balance and decrease fall risk.    Pt will benefit from skilled therapeutic intervention in order to improve on the following deficits Decreased balance   Rehab Potential Good   Clinical Impairments Affecting Rehab Potential Positive: improvement, motivation; Negative: central signs, chronicity    PT Frequency 1x / week   PT Duration 6 weeks   PT Treatment/Interventions ADLs/Self Care Home Management;Electrical Stimulation;Moist Heat;Ultrasound;Cryotherapy;Functional mobility training;Therapeutic activities;Therapeutic exercise;Neuromuscular re-education;Manual techniques;Scar mobilization;Passive range of motion;Dry needling;Vestibular;Canalith Repostioning   PT Next Visit Plan Progress gaze stabilization and high level balance exercises including foam and dynamic balance   PT Home Exercise Plan Single leg stance, tandem gait, gaze stabilization exercise (VOR X1)   Consulted and Agree with Plan of Care Patient          G-Codes - 01-30-2015 1444    Functional Limitation Mobility: Walking and moving around   Mobility: Walking and Moving Around Current Status [phone]) At least 20 percent but less than 40 percent impaired, limited or restricted   Mobility: Walking and Moving Around Goal Status [id number]) At least 1 percent but less than 20 percent impaired, limited or restricted      Problem List There are no active problems to display for this  patient.  [name] PT, DPT   Huprich,Jason Jan 30, 2015, 2:47 PM  [place] MAIN [facility] SERVICES [address] [place], [place], [address] Phone: [phone]   Fax:  [phone]

## 2015-01-29 ENCOUNTER — Ambulatory Visit: Payer: Medicare Other | Admitting: Physical Therapy

## 2015-01-29 ENCOUNTER — Encounter: Payer: Self-pay | Admitting: Physical Therapy

## 2015-01-29 DIAGNOSIS — M25511 Pain in right shoulder: Secondary | ICD-10-CM

## 2015-01-29 DIAGNOSIS — R531 Weakness: Secondary | ICD-10-CM | POA: Diagnosis not present

## 2015-01-29 DIAGNOSIS — R29898 Other symptoms and signs involving the musculoskeletal system: Secondary | ICD-10-CM

## 2015-01-29 NOTE — Therapy (Signed)
[place] MAIN [facility] SERVICES [address] [place], [place], [address] Phone: [phone]   Fax:  [phone]  Physical Therapy Treatment  Patient Details  Name: Howard Anderson MRN: [id number] Date of Birth: 30-Jun-1946 Referring Provider:  Juline Patch, MD  Encounter Date: 01/29/2015      PT End of Session - 01/29/15 1738    Visit Number 14  shoulder:4   Number of Visits 19  shoulder :9   Date for PT Re-Evaluation 01/20/15   Authorization Type g code every 10th visit   Authorization - Visit Number 10   Authorization - Number of Visits 19   PT Start Time 1700   PT Stop Time 1737   PT Time Calculation (min) 37 min   Activity Tolerance Patient tolerated treatment well;Patient limited by pain   Behavior During Therapy [facility] for tasks assessed/performed      Past Medical History  Diagnosis Date  . Hypertension   . Heart disease   . AAA (abdominal aortic aneurysm) without rupture   . Chest pain   . Shoulder fracture, right   . COPD (chronic obstructive pulmonary disease)     History reviewed. No pertinent past surgical history.  There were no vitals filed for this visit.  Visit Diagnosis:  Pain in joint, shoulder region, right  Weakness of shoulder      Subjective Assessment - 01/29/15 1707    Subjective Pt reports no pain today, just stiffness. Expresses that he feels that is is improving.    Patient is accompained by: Family member   Pertinent History Pt is awaiting AAA repair surery, being seen for BPPV and shoulder pain loss of motion    Limitations Walking   Patient Stated Goals Increase ROM to allow participate in recreational activities such as throwing a ball or swinging a golf club.    Currently in Pain? No/denies     Patient was 10 min late to session;    Treatment:   AAROM/strengthening: Seated shoulder adduction with red tband x12, green tband x12  Seater shoulder extension from flexion green tband 2x12   Seated shoulder IR green tband 2x12 Seated shoulder ER red tband 2x12     PT provided verbal and tactile instruction to focus on increased ROM with eccentric motion for all exercises, proper UE positioning and sequencing for each movement.   Manual therapy for PROM shoulder flexion/extention/ IR/ER; 10 second hold x 6 each motion. Inferior and posterior Shoulder grade 3 mobilizations at 90 degrees abduction, 3 bouts of 40 seconds each direction.                          PT Education - 01/29/15 1715    Education provided Yes   Education Details R shoulder AAROM and strengthening.    Person(s) Educated Patient   Methods Explanation;Demonstration;Tactile cues;Verbal cues   Comprehension Verbalized understanding;Returned demonstration;Verbal cues required             PT Long Term Goals - 01/30/15 0950    PT LONG TERM GOAL #5   Title Patient will demonstrate >110 degrees of shoulder abduction in order to allow patient to perform bathing tasks with greater ease. 02/11/2015   Baseline 90   Time 4   Period Weeks   Status New   PT LONG TERM GOAL #6   Title Pt will demonstrate >25 degrees IR to allow for greater ease of washing back while bathing. 02/11/2015  Baseline 14   Time 4   Period Weeks   PT LONG TERM GOAL #7   Title Increase shoulder flexion to >120 degrees to allow pt reach objects in cabinets at home. 02/11/2015   Baseline 100   Time 4   Period Weeks   PT LONG TERM GOAL #8   Title Decrease Quick dash by >5% point to demonstrate reduced sense of disability. 02/11/2015   Baseline 23%   Time 4   Period Weeks               Plan - 01/29/15 1723    Clinical Impression Statement patient arrived to therapy late. PT instructed pt in AAROM and strengthening and performed manual therapy for PROM in shoulder flexion, abduction, IR and ER; pt ROM grossly remains the same. Shoulder mobilizations performed, no increase in pain noted. Continued skilled PT  recommended to increase ROM, increase strength and decrease pain.    Pt will benefit from skilled therapeutic intervention in order to improve on the following deficits Hypomobility;Decreased strength;Decreased range of motion;Pain   Clinical Impairments Affecting Rehab Potential Positive: Understands benefit of PT. Negative: Chroninc shoulder condition, history of poor HEP compliance.    PT Frequency 2x / week   PT Duration 4 weeks   PT Treatment/Interventions ADLs/Self Care Home Management;Electrical Stimulation;Moist Heat;Ultrasound;Cryotherapy;Functional mobility training;Therapeutic activities;Therapeutic exercise;Neuromuscular re-education;Manual techniques;Scar mobilization;Passive range of motion;Dry needling   PT Next Visit Plan Manual therapy, shoulder strengthening.    PT Home Exercise Plan Continue as given    Consulted and Agree with Plan of Care Patient        Problem List There are no active problems to display for this patient. Barrie Folk, SPT This entire session was performed under direct supervision and direction of a licensed therapist . I have personally read, edited and approve of the note as written.    Hopkins,Margaret, PT, DPT 01/30/2015, 9:52 AM  [place] MAIN [facility] SERVICES [address] [place], [place], [address] Phone: [phone]   Fax:  [phone]

## 2015-02-02 ENCOUNTER — Other Ambulatory Visit: Payer: Self-pay | Admitting: Family Medicine

## 2015-02-02 DIAGNOSIS — K219 Gastro-esophageal reflux disease without esophagitis: Secondary | ICD-10-CM

## 2015-02-04 ENCOUNTER — Encounter: Payer: Self-pay | Admitting: Physical Therapy

## 2015-02-04 ENCOUNTER — Ambulatory Visit: Payer: Medicare Other | Attending: Internal Medicine | Admitting: Physical Therapy

## 2015-02-04 DIAGNOSIS — R531 Weakness: Secondary | ICD-10-CM | POA: Insufficient documentation

## 2015-02-04 DIAGNOSIS — M25511 Pain in right shoulder: Secondary | ICD-10-CM | POA: Diagnosis not present

## 2015-02-04 DIAGNOSIS — R29898 Other symptoms and signs involving the musculoskeletal system: Secondary | ICD-10-CM

## 2015-02-04 NOTE — Therapy (Signed)
[place] MAIN [facility] SERVICES [address] [place], [place], [address] Phone: [phone]   Fax:  [phone]  Physical Therapy Treatment  Patient Details  Name: Howard Anderson MRN: [id number] Date of Birth: [birth year] Referring Provider:  Thornton Park, MD  Encounter Date: 02/04/2015      PT End of Session - 02/04/15 1725    Visit Number 15  shoulder:5   Number of Visits 19  shoulder :9   Date for PT Re-Evaluation 01/20/15   Authorization Type g code every 10th visit   Authorization - Visit Number 10   Authorization - Number of Visits 19   PT Start Time 1700   PT Stop Time 1730   PT Time Calculation (min) 30 min   Activity Tolerance Patient tolerated treatment well;No increased pain   Behavior During Therapy [facility] for tasks assessed/performed      Past Medical History  Diagnosis Date  . Hypertension   . Heart disease   . AAA (abdominal aortic aneurysm) without rupture   . Chest pain   . Shoulder fracture, right   . COPD (chronic obstructive pulmonary disease)     History reviewed. No pertinent past surgical history.  There were no vitals filed for this visit.  Visit Diagnosis:  Pain in joint, shoulder region, right  Weakness of shoulder      Subjective Assessment - 02/04/15 1705    Subjective Patient late to appointment, "I was trying to avoid the bad weather."   Patient is accompained by: Family member   Pertinent History Pt is awaiting AAA repair surery, being seen for BPPV and shoulder pain loss of motion    Limitations Walking   Patient Stated Goals Increase ROM to allow participate in recreational activities such as throwing a ball or swinging a golf club.    Currently in Pain? Yes   Pain Score 3    Pain Location Shoulder   Pain Orientation Right   Pain Descriptors / Indicators Aching   Pain Type Chronic pain   Pain Radiating Towards right shoulder   Pain Onset More than a month ago   Pain Frequency  Intermittent   Aggravating Factors  movement   Pain Relieving Factors rest   Effect of Pain on Daily Activities decreased movement;           Treatment:  Warm up with pulleys in flexion BUE x2 min during history intake;  AAROM/strengthening: Standing shoulder IR/ER green tband x10 RUE with cues for positioning  Left sidelying: RUE shoulder abduction 2# 2x10; RUE shoulder ER 2# 2x10;  Supine: Wand BUE flexion 3# 2x15; Wand BUE chest press 3# x15;  Patient required min-moderate verbal/tactile cues for correct exercise technique including cues to slow down UE movement and cues for better positioning for increased rotation strengthening;                          PT Education - 02/04/15 1725    Education provided Yes   Education Details RUE strengthening exercise   Person(s) Educated Patient   Methods Explanation;Demonstration;Tactile cues;Verbal cues   Comprehension Verbalized understanding;Returned demonstration;Verbal cues required;Tactile cues required             PT Long Term Goals - 01/30/15 0950    PT LONG TERM GOAL #5   Title Patient will demonstrate >110 degrees of shoulder abduction in order to allow patient to perform bathing tasks with greater ease. 02/11/2015   Baseline  90   Time 4   Period Weeks   Status New   PT LONG TERM GOAL #6   Title Pt will demonstrate >25 degrees IR to allow for greater ease of washing back while bathing. 02/11/2015   Baseline 14   Time 4   Period Weeks   PT LONG TERM GOAL #7   Title Increase shoulder flexion to >120 degrees to allow pt reach objects in cabinets at home. 02/11/2015   Baseline 100   Time 4   Period Weeks   PT LONG TERM GOAL #8   Title Decrease Quick dash by >5% point to demonstrate reduced sense of disability. 02/11/2015   Baseline 23%   Time 4   Period Weeks               Plan - 02/04/15 1728    Clinical Impression Statement Patient was late to therapy appointment due to bad  weather; Focused on RUE strengthening with tband and weight exercise. Patient required increased cues for better positioning during shoulder IR/ER as he had difficulty performing correct exercise. Patient responded well to cues. He would benefit from additional skilled PT intervention to improve RUE ROM/strength for better tolerance with ADLs;    Pt will benefit from skilled therapeutic intervention in order to improve on the following deficits Hypomobility;Decreased strength;Decreased range of motion;Pain   Clinical Impairments Affecting Rehab Potential Positive: Understands benefit of PT. Negative: Chroninc shoulder condition, history of poor HEP compliance.    PT Frequency 2x / week   PT Duration 4 weeks   PT Treatment/Interventions ADLs/Self Care Home Management;Electrical Stimulation;Moist Heat;Ultrasound;Cryotherapy;Functional mobility training;Therapeutic activities;Therapeutic exercise;Neuromuscular re-education;Manual techniques;Scar mobilization;Passive range of motion;Dry needling   PT Next Visit Plan Manual therapy, shoulder strengthening.    PT Home Exercise Plan Continue as given    Consulted and Agree with Plan of Care Patient        Problem List There are no active problems to display for this patient.   Hopkins,Jamison Soward, PT, DPT 02/04/2015, 5:31 PM  [place] MAIN [facility] SERVICES [address] [place], [place], [address] Phone: [phone]   Fax:  [phone]

## 2015-02-11 ENCOUNTER — Ambulatory Visit: Payer: Medicare Other | Admitting: Physical Therapy

## 2015-02-11 ENCOUNTER — Encounter: Payer: Self-pay | Admitting: Physical Therapy

## 2015-02-11 ENCOUNTER — Ambulatory Visit: Payer: Medicare Other

## 2015-02-11 VITALS — BP 112/67 | HR 79

## 2015-02-11 DIAGNOSIS — M25511 Pain in right shoulder: Secondary | ICD-10-CM | POA: Diagnosis not present

## 2015-02-11 DIAGNOSIS — R2689 Other abnormalities of gait and mobility: Secondary | ICD-10-CM

## 2015-02-11 DIAGNOSIS — R531 Weakness: Secondary | ICD-10-CM | POA: Diagnosis not present

## 2015-02-11 DIAGNOSIS — R42 Dizziness and giddiness: Secondary | ICD-10-CM

## 2015-02-11 DIAGNOSIS — R29898 Other symptoms and signs involving the musculoskeletal system: Secondary | ICD-10-CM

## 2015-02-11 NOTE — Therapy (Signed)
[place] MAIN [facility] SERVICES [address] [place], [place], [address] Phone: [phone]   Fax:  [phone]  Physical Therapy Treatment  Patient Details  Name: Howard Anderson MRN: [id number] Date of Birth: 1945/09/24 Referring Provider:  Juline Patch, MD  Encounter Date: 02/11/2015      PT End of Session - 02/11/15 1722    Visit Number 15  shoulder: 6   Number of Visits 19  Shoulder: 9   Date for PT Re-Evaluation 03/10/15  02/11/2015   Authorization Type g code every 10th visit   PT Start Time 1630   PT Stop Time 1716   PT Time Calculation (min) 46 min   Equipment Utilized During Treatment Gait belt   Activity Tolerance Patient tolerated treatment well;Patient limited by pain   Behavior During Therapy [facility] for tasks assessed/performed      Past Medical History  Diagnosis Date  . Hypertension   . Heart disease   . AAA (abdominal aortic aneurysm) without rupture   . Chest pain   . Shoulder fracture, right   . COPD (chronic obstructive pulmonary disease)     History reviewed. No pertinent past surgical history.  There were no vitals filed for this visit.  Visit Diagnosis:  Weakness of shoulder  Pain in joint, shoulder region, right      Subjective Assessment - 02/11/15 1643    Subjective Patient states that he is having tightness in r UE today but no pain. Pt received treatment for BPPV prior to this therapy session.    Patient is accompained by: Family member   Pertinent History Pt is awaiting AAA repair surgery, being seen for BPPV and shoulder pain loss of motion    Limitations Walking   Patient Stated Goals Increase ROM to allow participate in recreational activities such as throwing a ball or swinging a golf club.    Currently in Pain? Yes   Pain Score 6    Pain Location Shoulder   Pain Orientation Right   Pain Descriptors / Indicators Tightness   Pain Type Chronic pain   Pain Radiating Towards R shoulder     Pain Onset More than a month ago   Pain Frequency Intermittent          treatment :   UE Ranger standing flexion 2x10  UE ranger flexion with abduction/adduciton 2x10  Finger ladder x5 to max ROM   UE arm bike level 4 1.5 minutes forward/1.5 minutes backwards, Cues for proper posture and increased scapular movement  Supine flexion AAROM with cane. Overpressure from PT  Supine chest press with cane and 5 lb weight. 2x10  Supine IR/ER with 2lb weight 2x10  Supine shoulder flexion 2 lb weight x10   Verbal and tactile instruction to prevent compensatory movement and increased ROM, proper technique to increase horizontal abduction, with all flexion movements.    Manual therapy  Grade 3 inferior GH joint mobilization at for 45 second bouts at 75 degrees abduction , 90 degrees abduction, 100 degrees abduction, 75 degrees of flexion, 90 degrees of flexion. Posterior grade 3 mobilizations of the [place] joint in 75, 90 and 100 degrees of abduction;  45 second bouts.  Passive ROM for shoulder abduction, flexion, and ER. 3 bouts of 1 minute for each motion.  Patient does not report increased pain upon completion of manual therapy.  PT Education - 02/11/15 1722    Education provided Yes   Education Details R UE ROM and strengthening.    Person(s) Educated Patient   Methods Explanation;Demonstration;Tactile cues;Verbal cues   Comprehension Verbalized understanding;Returned demonstration;Verbal cues required;Tactile cues required             PT Long Term Goals - 01/30/15 0950    PT LONG TERM GOAL #5   Title Patient will demonstrate >110 degrees of shoulder abduction in order to allow patient to perform bathing tasks with greater ease. 02/11/2015   Baseline 90   Time 4   Period Weeks   Status New   PT LONG TERM GOAL #6   Title Pt will demonstrate >25 degrees IR to allow for greater ease of washing back while bathing. 02/11/2015   Baseline 14    Time 4   Period Weeks   PT LONG TERM GOAL #7   Title Increase shoulder flexion to >120 degrees to allow pt reach objects in cabinets at home. 02/11/2015   Baseline 100   Time 4   Period Weeks   PT LONG TERM GOAL #8   Title Decrease Quick dash by >5% point to demonstrate reduced sense of disability. 02/11/2015   Baseline 23%   Time 4   Period Weeks               Plan - 02/11/15 1724    Clinical Impression Statement Pt noted increased tightness over the weekend in R shoulder, states that he does chest press and bicep curls to "stretch" shoulder out. PT instructed patient in R shoulder strengthening and ROM exercises, Moderate verbal instruction required to gain full ROM, and explain the need to push motion to improve ROM with HEP. Patient educated in exercise variation with 3# free weight, to try and increase exercise compliance. Continued skilled PT recommended to increase shoulder motion in order to increase independence with ADLs.   Pt will benefit from skilled therapeutic intervention in order to improve on the following deficits Hypomobility;Impaired UE functional use;Decreased range of motion;Improper body mechanics;Postural dysfunction;Pain   Rehab Potential Good   Clinical Impairments Affecting Rehab Potential Positive: improvement, motivation; Negative: central signs, chronicity    PT Frequency 2x / week   PT Duration 6 weeks   PT Treatment/Interventions ADLs/Self Care Home Management;Electrical Stimulation;Moist Heat;Ultrasound;Cryotherapy;Functional mobility training;Therapeutic activities;Therapeutic exercise;Neuromuscular re-education;Manual techniques;Scar mobilization;Passive range of motion;Dry needling;Vestibular;Canalith Repostioning   PT Next Visit Plan ROM and manual therapy.    PT Home Exercise Plan continue as given    Consulted and Agree with Plan of Care Patient        Problem List There are no active problems to display for this patient.  Barrie Folk  SPT 02/12/2015   4:58 PM  This entire session was performed under direct supervision and direction of a licensed therapist . I have personally read, edited and approve of the note as written.  Hopkins,Margaret 02/12/2015, 4:58 PM  [place] MAIN [facility] SERVICES [address] [place], [place], [address] Phone: [phone]   Fax:  [phone]

## 2015-02-11 NOTE — Therapy (Signed)
[place] MAIN [facility] SERVICES [address] [place], [place], [address] Phone: [phone]   Fax:  [phone]  Physical Therapy Treatment  Patient Details  Name: Howard Anderson MRN: [id number] Date of Birth: 10-30-1945 Referring Provider:  Juline Patch, MD  Encounter Date: 02/11/2015      PT End of Session - 02/11/15 1601    Visit Number 14  shoulder: 3   Number of Visits 19  Shoulder: 9   Date for PT Re-Evaluation 03/10/15  02/11/2015   Authorization Type g code every 10th visit   Authorization - Visit Number --   Authorization - Number of Visits --   PT Start Time 1550   PT Stop Time 1630   PT Time Calculation (min) 40 min   Equipment Utilized During Treatment Gait belt   Activity Tolerance Patient tolerated treatment well;Patient limited by pain   Behavior During Therapy [facility] for tasks assessed/performed      Past Medical History  Diagnosis Date  . Hypertension   . Heart disease   . AAA (abdominal aortic aneurysm) without rupture   . Chest pain   . Shoulder fracture, right   . COPD (chronic obstructive pulmonary disease)     History reviewed. No pertinent past surgical history.  Filed Vitals:   02/11/15 1555  BP: 112/67  Pulse: 79    Visit Diagnosis:  Dizziness and giddiness  Balance problem      Subjective Assessment - 02/11/15 1553    Subjective Pt states he has continued having some episodes of dizziness but they are short duration. He feels overall like he has had significant improvement in symptoms since starting therapy. Pt reports 50% improvement in balance since starting therapy. He has had a couple episodes over vertigo over the last 1-2 days.    Patient is accompained by: Family member   Pertinent History Pt is awaiting AAA repair surgery, being seen for BPPV and shoulder pain loss of motion    Limitations Walking   Patient Stated Goals Decrease di   Currently in Pain? Yes   Pain Score 5    Pain  Location Shoulder   Pain Orientation Right   Pain Descriptors / Indicators Sore   Pain Type Chronic pain   Pain Onset More than a month ago   Pain Frequency Intermittent   Aggravating Factors  Movement   Pain Relieving Factors Rest   Multiple Pain Sites No       OBJECTIVE: Pt reporting return of some vertigo episodes over the last 1-2 days. Marye Round performed with patient which was positive on the R for vertigo and upbeating R torsional nystagmus of approximately 10 beats. Pt treated with Epley x 2, during second Epley pt is negative but taken through entire routine again. Pt remains in each position for 2 minutes. Following CRT pt performed following balance exercises: single leg balance in // bars, tandem balance in // bars with slow horizontal head turns; tandem gait in // bars; Airex cone taps. Pt instructed in quick steps and sit to stand for LE strengthening; Pt encouraged to continue HEP and follow-up as scheduled.                           PT Education - 02/11/15 1600    Education provided Yes   Education Details HEP reinforced   Person(s) Educated Patient   Methods Explanation;Demonstration   Comprehension Verbalized understanding  PT Long Term Goals - 01/30/15 0950    PT LONG TERM GOAL #5   Title Patient will demonstrate >110 degrees of shoulder abduction in order to allow patient to perform bathing tasks with greater ease. 02/11/2015   Baseline 90   Time 4   Period Weeks   Status New   PT LONG TERM GOAL #6   Title Pt will demonstrate >25 degrees IR to allow for greater ease of washing back while bathing. 02/11/2015   Baseline 14   Time 4   Period Weeks   PT LONG TERM GOAL #7   Title Increase shoulder flexion to >120 degrees to allow pt reach objects in cabinets at home. 02/11/2015   Baseline 100   Time 4   Period Weeks   PT LONG TERM GOAL #8   Title Decrease Quick dash by >5% point to demonstrate reduced sense of disability.  02/11/2015   Baseline 23%   Time 4   Period Weeks               Plan - 02/11/15 1603    Clinical Impression Statement R dix hallpike once again positive today so pt treated again. Reinforced HEP and instructed pt in some additional standing strengthening exercises. Pt encouraged to continue HEP and follow-up as scheduled.    Pt will benefit from skilled therapeutic intervention in order to improve on the following deficits Decreased balance   Rehab Potential Good   Clinical Impairments Affecting Rehab Potential Positive: improvement, motivation; Negative: central signs, chronicity    PT Frequency 1x / week   PT Duration 6 weeks   PT Treatment/Interventions ADLs/Self Care Home Management;Electrical Stimulation;Moist Heat;Ultrasound;Cryotherapy;Functional mobility training;Therapeutic activities;Therapeutic exercise;Neuromuscular re-education;Manual techniques;Scar mobilization;Passive range of motion;Dry needling;Vestibular;Canalith Repostioning   PT Next Visit Plan Progress gaze stabilization and high level balance exercises including foam and dynamic balance   PT Home Exercise Plan Single leg stance, tandem gait, gaze stabilization exercise (VOR X1)   Consulted and Agree with Plan of Care Patient        Problem List There are no active problems to display for this patient.  [name] PT, DPT   Mayola Mcbain 02/11/2015, 4:55 PM  [place] MAIN [facility] SERVICES [address] [place], [place], [address] Phone: [phone]   Fax:  [phone]

## 2015-02-13 ENCOUNTER — Encounter: Payer: Self-pay | Admitting: Physical Therapy

## 2015-02-13 ENCOUNTER — Ambulatory Visit: Payer: Medicare Other | Admitting: Physical Therapy

## 2015-02-13 DIAGNOSIS — R531 Weakness: Secondary | ICD-10-CM | POA: Diagnosis not present

## 2015-02-13 DIAGNOSIS — M25511 Pain in right shoulder: Secondary | ICD-10-CM

## 2015-02-13 DIAGNOSIS — R29898 Other symptoms and signs involving the musculoskeletal system: Secondary | ICD-10-CM

## 2015-02-13 NOTE — Therapy (Signed)
[place] MAIN [facility] SERVICES [address] [place], [place], [address] Phone: [phone]   Fax:  [phone]  Physical Therapy Treatment  Patient Details  Name: JOHNCARLO MAALOUF MRN: [id number] Date of Birth: 1945-08-31 Referring Provider:  Juline Patch, MD  Encounter Date: 02/13/2015      PT End of Session - 02/14/15 0855    Visit Number 17  shoulder: 8   Number of Visits 19  Shoulder: 9   Date for PT Re-Evaluation 03/10/15  02/11/2015   Authorization Type g code every 10th visit   Authorization - Visit Number 10   Authorization - Number of Visits 19   PT Start Time 4081   PT Stop Time 1729   PT Time Calculation (min) 42 min   Activity Tolerance Patient tolerated treatment well;Patient limited by pain   Behavior During Therapy [facility] for tasks assessed/performed      Past Medical History  Diagnosis Date  . Hypertension   . Heart disease   . AAA (abdominal aortic aneurysm) without rupture   . Chest pain   . Shoulder fracture, right   . COPD (chronic obstructive pulmonary disease)     History reviewed. No pertinent past surgical history.  There were no vitals filed for this visit.  Visit Diagnosis:  Weakness of shoulder  Pain in joint, shoulder region, right      Subjective Assessment - 02/13/15 1657    Subjective Patient reports continued stiffness in RUE shoulder. He denies any pain currently. Patient reports compliance with HEP;    Patient is accompained by: Family member   Pertinent History Pt is awaiting AAA repair surgery, being seen for BPPV and shoulder pain loss of motion    Limitations Walking   Patient Stated Goals Increase ROM to allow participate in recreational activities such as throwing a ball or swinging a golf club.    Currently in Pain? No/denies   Pain Onset More than a month ago         TREATMENT: BUE UBE, backwards only x3 min (unbilled) UE Ranger: circles, clockwise/counterclockwise, RUE x30  sec each; UE Ranger: Alphabet, A-Z capital letters, RUE only x1;  Standing with red tband: RUE shoulder IR/ER 2x10 each;  Supine: Shoulder protraction with 2#/3# x10 each; RUE only; Shoulder circles small clockwise/counterclockwise 2#/3# x10 each, with each weight, RUE only;  Patient required min-moderate verbal/tactile cues for correct exercise technique including cues for correct positioning and posture for better stretch and ROM; Patient required mod VCs to avoid trunk rotation with standing tband shoulder IR/ER for better shoulder strength/flexibiltiy;  Manual therapy  Grade II-III inferior/posterior and anterior glenohumeral joint mobs to RUE, 10 sec bouts x5 each; PT performed PROM of RUE shoulder flexion/abduction, IR/ER at end range x8 reps each;  Patient reports increased discomfort at end range, but denies any pain at rest following manual therapy;                                PT Education - 02/14/15 0855    Education provided Yes   Education Details RUE ROM/exercise    Person(s) Educated Patient   Methods Explanation;Verbal cues   Comprehension Verbalized understanding;Returned demonstration;Verbal cues required             PT Long Term Goals - 01/30/15 0950    PT LONG TERM GOAL #5   Title Patient will demonstrate >110 degrees of shoulder abduction  in order to allow patient to perform bathing tasks with greater ease. 02/11/2015   Baseline 90   Time 4   Period Weeks   Status New   PT LONG TERM GOAL #6   Title Pt will demonstrate >25 degrees IR to allow for greater ease of washing back while bathing. 02/11/2015   Baseline 14   Time 4   Period Weeks   PT LONG TERM GOAL #7   Title Increase shoulder flexion to >120 degrees to allow pt reach objects in cabinets at home. 02/11/2015   Baseline 100   Time 4   Period Weeks   PT LONG TERM GOAL #8   Title Decrease Quick dash by >5% point to demonstrate reduced sense of disability. 02/11/2015    Baseline 23%   Time 4   Period Weeks               Plan - 02/14/15 0857    Clinical Impression Statement Patient continues to have tightness in RUE shoulder. He responded well to cues for better positioning during ROM/strengthening exercise. PT also performed increased manual therapy for increased joint mobility. Patient continues to have most limitation in shoulder rotation and shoulder flexion. He would benefit from additional skilled PT intervention to improve UE strength/ROM and reduce pain.    Pt will benefit from skilled therapeutic intervention in order to improve on the following deficits Hypomobility;Impaired UE functional use;Decreased range of motion;Improper body mechanics;Postural dysfunction;Pain   Rehab Potential Good   Clinical Impairments Affecting Rehab Potential Positive: improvement, motivation; Negative: central signs, chronicity    PT Frequency 2x / week   PT Duration 6 weeks   PT Treatment/Interventions ADLs/Self Care Home Management;Electrical Stimulation;Moist Heat;Ultrasound;Cryotherapy;Functional mobility training;Therapeutic activities;Therapeutic exercise;Neuromuscular re-education;Manual techniques;Scar mobilization;Passive range of motion;Dry needling;Vestibular;Canalith Repostioning   PT Next Visit Plan ROM and manual therapy.    PT Home Exercise Plan continue as given    Consulted and Agree with Plan of Care Patient        Problem List There are no active problems to display for this patient.   Hopkins,Keaten Mashek 02/14/2015, 8:59 AM  [place] MAIN [facility] SERVICES [address] [place], [place], [address] Phone: [phone]   Fax:  [phone]

## 2015-02-18 ENCOUNTER — Encounter: Payer: Self-pay | Admitting: Physical Therapy

## 2015-02-18 ENCOUNTER — Ambulatory Visit: Payer: Medicare Other | Admitting: Physical Therapy

## 2015-02-18 DIAGNOSIS — R29898 Other symptoms and signs involving the musculoskeletal system: Secondary | ICD-10-CM

## 2015-02-18 DIAGNOSIS — M25511 Pain in right shoulder: Secondary | ICD-10-CM

## 2015-02-18 DIAGNOSIS — R531 Weakness: Secondary | ICD-10-CM | POA: Diagnosis not present

## 2015-02-18 NOTE — Therapy (Signed)
[place] MAIN [facility] SERVICES [address] [place], [place], [address] Phone: [phone]   Fax:  [phone]  Physical Therapy Treatment  Patient Details  Name: Howard Anderson MRN: [id number] Date of Birth: 1946/05/17 Referring Provider:  Thornton Park, MD  Encounter Date: 02/18/2015      PT End of Session - 02/19/15 0813    Visit Number 18  shoulder: 9   Number of Visits 19  Shoulder: 17   Date for PT Re-Evaluation 03/10/15  03/13/15   Authorization Type g code every 10th visit   Authorization - Visit Number 10   Authorization - Number of Visits 19   Activity Tolerance Patient tolerated treatment well;Patient limited by pain   Behavior During Therapy [facility] for tasks assessed/performed      Past Medical History  Diagnosis Date  . Hypertension   . Heart disease   . AAA (abdominal aortic aneurysm) without rupture   . Chest pain   . Shoulder fracture, right   . COPD (chronic obstructive pulmonary disease)     History reviewed. No pertinent past surgical history.  There were no vitals filed for this visit.  Visit Diagnosis:  Weakness of shoulder - Plan: PT plan of care cert/re-cert  Pain in joint, shoulder region, right - Plan: PT plan of care cert/re-cert      Subjective Assessment - 02/18/15 1648    Subjective Patient reports continued stiffness in RUE shoulder. He denies any pain currently. "I feel that my shoulder is more stiff today for some reason"Patient reports compliance with HEP;    Patient is accompained by: Family member   Pertinent History Pt is awaiting AAA repair surgery, being seen for BPPV and shoulder pain loss of motion    Limitations Walking   Patient Stated Goals Increase ROM to allow participate in recreational activities such as throwing a ball or swinging a golf club.    Currently in Pain? No/denies   Pain Onset More than a month ago            [facility] PT Assessment - 02/18/15 1702    Observation/Other Assessments   Quick DASH  15.9% impaired (improved from initial eval on 01/14/15 which was 22% impaired)   AROM   Right Shoulder Extension 55 Degrees   Right Shoulder Flexion 110 Degrees   Right Shoulder ABduction 90 Degrees   Right Shoulder Internal Rotation 50 Degrees   Right Shoulder External Rotation 35 Degrees     TREATMENT: BUE UBE, level 3 backwards only x3 min (unbilled) UE Ranger: circles, clockwise/counterclockwise, RUE x10 each; UE Ranger: Alphabet, A-Z capital letters, RUE only x1; (Patient required min Vcs to increase shoulder flexion ROM during shoulder circles/alphabet. He fatigues quickly with alphabet requiring rest break after letter I, O, etc) Sitting: wand flexion BUE x10;  Supine: Shoulder protraction with 2#x15 each; RUE only; Shoulder circles small clockwise/counterclockwise 2#x10 each,RUE only; BUE wand flexion x10;  Standing RUE shoulder flexion against wall with cues to step forward and lean into over pressure 5 sec hold x5; Patient required min-moderate verbal/tactile cues for correct exercise technique including cues for correct positioning and posture for better stretch and ROM; Patient required mod VCs to avoid trunk rotation with standing tband shoulder IR/ER for better shoulder strength/flexibiltiy;   Manual therapy  Grade II-III inferior/posterior and anterior glenohumeral joint mobs to RUE, 10 sec bouts x5 each; PT performed PROM of RUE shoulder flexion/abduction, IR/ER at end range x8 reps each;  Patient reports increased  discomfort at end range, but denies any pain at rest following manual therapy;   Please see attached objective measures for patient's progress towards goals.                         PT Education - 02/19/15 [phone]    Education provided Yes   Education Details RUE ROM/exercise, plan of care   Person(s) Educated Patient   Methods Explanation;Demonstration;Verbal cues   Comprehension Verbalized  understanding;Returned demonstration;Verbal cues required             PT Long Term Goals - 02/18/15 1727    PT LONG TERM GOAL #5   Title Patient will demonstrate >110 degrees of shoulder abduction in order to allow patient to perform bathing tasks with greater ease. 02/11/2015   Baseline 90   Time 4   Period Weeks   Status Not Met   PT LONG TERM GOAL #6   Title Pt will demonstrate >25 degrees IR to allow for greater ease of washing back while bathing. 02/11/2015   Baseline 14   Time 4   Period Weeks   Status Achieved   PT LONG TERM GOAL #7   Title Increase shoulder flexion to >120 degrees to allow pt reach objects in cabinets at home. 02/11/2015   Baseline 100   Time 4   Period Weeks   Status Partially Met   PT LONG TERM GOAL #8   Title Decrease Quick dash by >5% point to demonstrate reduced sense of disability. 02/11/2015   Baseline 23%   Time 4   Period Weeks   Status Achieved               Plan - 02/19/15 0817    Clinical Impression Statement Patient continues to have tightness in RUE shoulder especially with shoulder flexion/abduction. PT reassessed goals. Patient demonstrates some improvement in shoulder ROM, particularly with shoulder IR/extension. He also demonstrates overall improved mobility with lower impairement on Quick Dash. Patient would benefit from additional skilled PT Intervention to improve shoulder ROM and reduce pain with ADLs. PT plans to focus on additional manual therapy techniques to improve shoulder ROM/flexibility.    Pt will benefit from skilled therapeutic intervention in order to improve on the following deficits Hypomobility;Impaired UE functional use;Decreased range of motion;Improper body mechanics;Postural dysfunction;Pain   Rehab Potential Good   Clinical Impairments Affecting Rehab Potential Positive: improvement, motivation; Negative: central signs, chronicity    PT Frequency 2x / week   PT Duration 4 weeks   PT  Treatment/Interventions ADLs/Self Care Home Management;Electrical Stimulation;Moist Heat;Ultrasound;Cryotherapy;Functional mobility training;Therapeutic activities;Therapeutic exercise;Neuromuscular re-education;Manual techniques;Scar mobilization;Passive range of motion;Dry needling;Vestibular;Canalith Repostioning   PT Next Visit Plan ROM and manual therapy.    PT Home Exercise Plan continue as given    Consulted and Agree with Plan of Care Patient        Problem List There are no active problems to display for this patient.   Hopkins,Margaret, PT, DPT 02/19/2015, 8:25 AM  [place] MAIN [facility] SERVICES [address] [place], [place], [address] Phone: [phone]   Fax:  [phone]

## 2015-02-20 ENCOUNTER — Encounter: Payer: Self-pay | Admitting: Physical Therapy

## 2015-02-20 ENCOUNTER — Ambulatory Visit: Payer: Medicare Other

## 2015-02-20 ENCOUNTER — Ambulatory Visit: Payer: Medicare Other | Admitting: Physical Therapy

## 2015-02-20 DIAGNOSIS — R2689 Other abnormalities of gait and mobility: Secondary | ICD-10-CM

## 2015-02-20 DIAGNOSIS — R42 Dizziness and giddiness: Secondary | ICD-10-CM

## 2015-02-20 DIAGNOSIS — R29898 Other symptoms and signs involving the musculoskeletal system: Secondary | ICD-10-CM

## 2015-02-20 DIAGNOSIS — M25511 Pain in right shoulder: Secondary | ICD-10-CM | POA: Diagnosis not present

## 2015-02-20 DIAGNOSIS — R531 Weakness: Secondary | ICD-10-CM | POA: Diagnosis not present

## 2015-02-20 NOTE — Therapy (Signed)
[place] MAIN [facility] SERVICES [address] [place], [place], [address] Phone: [phone]   Fax:  [phone]  Physical Therapy Treatment  Patient Details  Name: Howard Anderson MRN: [id number] Date of Birth: 1945/12/30 Referring Provider:  Beverly Gust, MD  Encounter Date: 02/20/2015      PT End of Session - 02/20/15 1645    Visit Number 12  shoulder: 3   Number of Visits 18  Shoulder: 9   Date for PT Re-Evaluation 03/10/15  02/11/2015   Authorization Type g code every 10th visit   PT Start Time 1550   PT Stop Time 1635   PT Time Calculation (min) 45 min   Activity Tolerance Patient tolerated treatment well   Behavior During Therapy [facility] for tasks assessed/performed      Past Medical History  Diagnosis Date  . Hypertension   . Heart disease   . AAA (abdominal aortic aneurysm) without rupture   . Chest pain   . Shoulder fracture, right   . COPD (chronic obstructive pulmonary disease)     History reviewed. No pertinent past surgical history.  There were no vitals filed for this visit.  Visit Diagnosis:  Dizziness and giddiness  Balance problem      Subjective Assessment - 02/20/15 1650    Subjective Patient reports that he has no pain in the R shoulder on this day and the stiffness has not been noticed through daily activities today.    Patient is accompained by: Family member   Pertinent History Pt is awaiting AAA repair surgery, being seen for BPPV and shoulder pain loss of motion    Limitations Walking   Patient Stated Goals Increase ROM to allow participate in recreational activities such as throwing a ball or swinging a golf club.    Currently in Pain? No/denies   Pain Onset More than a month ago       OBJECTIVE: Pt reporting return of some continued vertigo episodes. Marye Round performed with patient which was positive on the R for vertigo and upbeating R torsional nystagmus of approximately 7- 0 beats. Pt  educated about self-treatment with Epley and provided handout. Walked through all of the positions with instruction about performing with head over pillow. Pt repeated for second bout and demonstrates 15-20 beats of nystagmus but much less vigorous however still symptomatic. Pt still struggles to perform correctly without cues from therapist. Handout provided to patient with pictures and instructions about how to perform Epley for R posterior canal BPPV. Pt encouraged to continue HEP and follow-up as scheduled.                                   PT Education - 02/20/15 1643    Education provided Yes   Education Details Pt provided with written instructions how to perform Epley at home.    Person(s) Educated Patient   Methods Explanation;Demonstration;Verbal cues;Tactile cues;Handout   Comprehension Verbalized understanding;Returned demonstration;Verbal cues required;Need further instruction  Reinforce epley             PT Long Term Goals - 02/18/15 1727    PT LONG TERM GOAL #5   Title Patient will demonstrate >110 degrees of shoulder abduction in order to allow patient to perform bathing tasks with greater ease. 02/11/2015   Baseline 90   Time 4   Period Weeks   Status Not Met   PT LONG TERM  GOAL #6   Title Pt will demonstrate >25 degrees IR to allow for greater ease of washing back while bathing. 02/11/2015   Baseline 14   Time 4   Period Weeks   Status Achieved   PT LONG TERM GOAL #7   Title Increase shoulder flexion to >120 degrees to allow pt reach objects in cabinets at home. 02/11/2015   Baseline 100   Time 4   Period Weeks   Status Partially Met   PT LONG TERM GOAL #8   Title Decrease Quick dash by >5% point to demonstrate reduced sense of disability. 02/11/2015   Baseline 23%   Time 4   Period Weeks   Status Achieved               Plan - 02/20/15 1646    Clinical Impression Statement Patient continues to have a positive Dix  Hallpike test on the right. Pt performed and educated about how to perform Epley maneuver at home. Provided with handout for home. Pt instructed to perform daily until he returns to see therapist. Do no perform the day of therapy in order to assess in the clinic. Pt reports feeling confident continuing at home. Pt advised to follow-up for 2 more visits after which he will be discharged from therapy.    Pt will benefit from skilled therapeutic intervention in order to improve on the following deficits Decreased balance;Dizziness   Rehab Potential Good   Clinical Impairments Affecting Rehab Potential Positive: improvement, motivation; Negative: central signs, chronicity    PT Frequency 1x / week   PT Duration 6 weeks   PT Treatment/Interventions ADLs/Self Care Home Management;Electrical Stimulation;Moist Heat;Ultrasound;Cryotherapy;Functional mobility training;Therapeutic activities;Therapeutic exercise;Neuromuscular re-education;Manual techniques;Scar mobilization;Passive range of motion;Dry needling;Vestibular;Canalith Repostioning   PT Next Visit Plan Have pt demonstrate Marye Round followed by Epley to recheck for BPPV and assess competence. If negative review and perform HEP as well as high level balance exercises.    PT Home Exercise Plan Single leg stance, tandem gait, gaze stabilization exercise (VOR X1)   Consulted and Agree with Plan of Care Patient        Problem List There are no active problems to display for this patient.  [name] PT, DPT   Huprich,Jason 02/20/2015, 4:54 PM  [place] MAIN [facility] SERVICES [address] [place], [place], [address] Phone: [phone]   Fax:  [phone]

## 2015-02-20 NOTE — Therapy (Signed)
[place] MAIN [facility] SERVICES [address] [place], [place], [address] Phone: [phone]   Fax:  [phone]  Physical Therapy Treatment  Patient Details  Name: Howard Anderson MRN: [id number] Date of Birth: Nov 11, 1945 Referring Provider:  Thornton Park, MD  Encounter Date: 02/20/2015      PT End of Session - 02/20/15 1710    Visit Number 19  shoulder: 10   Number of Visits 19  Shoulder: 17   Date for PT Re-Evaluation 03/10/15  03/13/15   Authorization Type g code every 10th visit   Authorization - Visit Number 10   Authorization - Number of Visits 19   PT Start Time 5883   PT Stop Time 1730   PT Time Calculation (min) 45 min   Activity Tolerance Patient tolerated treatment well;Patient limited by pain   Behavior During Therapy [facility] for tasks assessed/performed      Past Medical History  Diagnosis Date  . Hypertension   . Heart disease   . AAA (abdominal aortic aneurysm) without rupture   . Chest pain   . Shoulder fracture, right   . COPD (chronic obstructive pulmonary disease)     History reviewed. No pertinent past surgical history.  There were no vitals filed for this visit.  Visit Diagnosis:  Weakness of shoulder  Pain in joint, shoulder region, right      Subjective Assessment - 02/20/15 1650    Subjective Patient reports that he has no pain in the R shoulder on this day and the stiffness has not been noticed through daily activities today.    Patient is accompained by: Family member   Pertinent History Pt is awaiting AAA repair surgery, being seen for BPPV and shoulder pain loss of motion    Limitations Walking   Patient Stated Goals Increase ROM to allow participate in recreational activities such as throwing a ball or swinging a golf club.    Currently in Pain? No/denies   Pain Onset More than a month ago       TREATMENT: Warm up on UBE, BUE level 2 x4 min backwards only (unbilled)  UE ranger large  circles 2x10 cues for increased elbow extension  UE shoulder flexion on wall with step to increase stretch x8 tactile instruction to prevent compensation from trunk and elbow.   Standing RUE shoulder flexion yellow tband x10 with cues to increase elbow extension;  Seated RUE shoulder flexion AROM x10 with cues for better positioning to avoid trunk extension for increased shoulder ROM;  PT performed Manual therapy to patient's RUE shoulder including: PROM of RUE into: shoulder flexion/extension, abduction/adduction, IR/ER Grade II-III inferior/posterior joint mobs to RUE glenohumeral joint 10 sec bouts x 6    Patient reports no pain after manual therapy and reports overall less stiffness, tolerating treatment session well.                          PT Education - 02/20/15 1709    Education provided Yes   Education Details shoulder exercise, re-educated in HEP   Person(s) Educated Patient   Methods Explanation;Verbal cues   Comprehension Verbalized understanding;Returned demonstration;Verbal cues required             PT Long Term Goals - 02/18/15 1727    PT LONG TERM GOAL #5   Title Patient will demonstrate >110 degrees of shoulder abduction in order to allow patient to perform bathing tasks with greater ease. 02/11/2015  Baseline 90   Time 4   Period Weeks   Status Not Met   PT LONG TERM GOAL #6   Title Pt will demonstrate >25 degrees IR to allow for greater ease of washing back while bathing. 02/11/2015   Baseline 14   Time 4   Period Weeks   Status Achieved   PT LONG TERM GOAL #7   Title Increase shoulder flexion to >120 degrees to allow pt reach objects in cabinets at home. 02/11/2015   Baseline 100   Time 4   Period Weeks   Status Partially Met   PT LONG TERM GOAL #8   Title Decrease Quick dash by >5% point to demonstrate reduced sense of disability. 02/11/2015   Baseline 23%   Time 4   Period Weeks   Status Achieved                Plan - 02/20/15 1710    Clinical Impression Statement Patient instructed in advanced RUE shoulder flexion exercise (ROM and resisted); He required increased cues for correct posture and positioning. Patient continues to have stiffness during shoulder flexion/abduction. He would benefit from additional skilled PT intervention to improve shoulder ROM , reduce pain and improve mobility.    Pt will benefit from skilled therapeutic intervention in order to improve on the following deficits Hypomobility;Impaired UE functional use;Decreased range of motion;Improper body mechanics;Postural dysfunction;Pain   Rehab Potential Good   Clinical Impairments Affecting Rehab Potential Positive: improvement, motivation; Negative: central signs, chronicity    PT Frequency 2x / week   PT Duration 4 weeks   PT Treatment/Interventions ADLs/Self Care Home Management;Electrical Stimulation;Moist Heat;Ultrasound;Cryotherapy;Functional mobility training;Therapeutic activities;Therapeutic exercise;Neuromuscular re-education;Manual techniques;Scar mobilization;Passive range of motion;Dry needling;Vestibular;Canalith Repostioning   PT Next Visit Plan ROM and manual therapy.    PT Home Exercise Plan continue as previously given    Consulted and Agree with Plan of Care Patient        Problem List There are no active problems to display for this patient.  Barrie Folk SPT 02/20/2015   5:43 PM  This entire session was performed under direct supervision and direction of a licensed therapist . I have personally read, edited and approve of the note as written.  Hopkins,Margaret , PT, DPT  02/20/2015, 5:43 PM  [place] MAIN [facility] SERVICES [address] [place], [place], [address] Phone: [phone]   Fax:  [phone]

## 2015-02-25 ENCOUNTER — Ambulatory Visit: Payer: Medicare Other | Admitting: Physical Therapy

## 2015-02-25 ENCOUNTER — Encounter: Payer: Self-pay | Admitting: Physical Therapy

## 2015-02-25 DIAGNOSIS — M25511 Pain in right shoulder: Secondary | ICD-10-CM | POA: Diagnosis not present

## 2015-02-25 DIAGNOSIS — R531 Weakness: Secondary | ICD-10-CM | POA: Diagnosis not present

## 2015-02-25 DIAGNOSIS — R29898 Other symptoms and signs involving the musculoskeletal system: Secondary | ICD-10-CM

## 2015-02-25 NOTE — Therapy (Signed)
[place] MAIN [facility] SERVICES [address] [place], [place], [address] Phone: [phone]   Fax:  [phone]  Physical Therapy Treatment  Patient Details  Name: Howard Anderson MRN: [id number] Date of Birth: 1946/06/23 Referring Provider:  Thornton Park, MD  Encounter Date: 02/25/2015      PT End of Session - 02/26/15 0850    Visit Number 20  shoulder: 11   Number of Visits 19  Shoulder: 17   Date for PT Re-Evaluation 03/10/15  03/13/15   Authorization Type g code every 10th visit   Authorization - Visit Number 10   Authorization - Number of Visits 19   PT Start Time 1937   PT Stop Time 1732   PT Time Calculation (min) 47 min   Activity Tolerance Patient tolerated treatment well;Patient limited by pain   Behavior During Therapy [facility] for tasks assessed/performed      Past Medical History  Diagnosis Date  . Hypertension   . Heart disease   . AAA (abdominal aortic aneurysm) without rupture   . Chest pain   . Shoulder fracture, right   . COPD (chronic obstructive pulmonary disease)     History reviewed. No pertinent past surgical history.  There were no vitals filed for this visit.  Visit Diagnosis:  Weakness of shoulder  Pain in joint, shoulder region, right      Subjective Assessment - 02/25/15 1653    Subjective Patient reports no pain at rest, but continue to have soreness with RUE movement. He reports compliance with HEP 2x over the weekend.    Patient is accompained by: Family member   Pertinent History Pt is awaiting AAA repair surgery, being seen for BPPV and shoulder pain loss of motion    Limitations Walking   Patient Stated Goals Increase ROM to allow participate in recreational activities such as throwing a ball or swinging a golf club.    Currently in Pain? No/denies  when at rest; 5/10 with movement   Pain Onset More than a month ago       TREATMENT: Initial ROM, RUE shoulder flexion 90 degrees,  abduction: 60 degrees;  BUE UBE, backwards only level 2 x3 min (unbilled);  RUE UE ranger, alphabet, A-Z (capital letters) x1 with cues for positioning and to maintain RUE elbow extension to improve shoulder ROM; RUE UE ranger in sitting: horizontal abduction/adduction x2 min; circles clockwise/counterclockwise x1 min each (shoulder in 90 degrees flexion);  Finger ladder, RUE only into flexion 5 sec hold x10;   PT performed manual therapy to patient's RUE shoulder: PROM in all directions with overpressure at end range x10 each (flex, abduction, IR/ER) Grade II-III inferior joint mobs, 10 sec bouts, x5 reps x3 sets with shoulder in 70-90 degrees abduction; PT performed inferior joint mobilization with movement: sitting: shoulder abduction and flexion x10 each with cues to reduce shoulder elevation during mobilization with movement.  Patient exhibits improved shoulder ROM following manual therapy: shoulder flexion: 100 degrees, abduction: 72 degrees  Patient reports increased discomfort with shoulder ROM at end range. He denies any pain at rest. He was able to reach just behind his ear when reaching behind head, and anterior left shoulder and behind back with RUE;                            PT Education - 02/26/15 0850    Education provided Yes   Education Details shoulder exercise/ROM  Person(s) Educated Patient   Methods Explanation;Verbal cues   Comprehension Verbalized understanding;Returned demonstration;Verbal cues required             PT Long Term Goals - 02/18/15 1727    PT LONG TERM GOAL #5   Title Patient will demonstrate >110 degrees of shoulder abduction in order to allow patient to perform bathing tasks with greater ease. 02/11/2015   Baseline 90   Time 4   Period Weeks   Status Not Met   PT LONG TERM GOAL #6   Title Pt will demonstrate >25 degrees IR to allow for greater ease of washing back while bathing. 02/11/2015   Baseline 14   Time 4    Period Weeks   Status Achieved   PT LONG TERM GOAL #7   Title Increase shoulder flexion to >120 degrees to allow pt reach objects in cabinets at home. 02/11/2015   Baseline 100   Time 4   Period Weeks   Status Partially Met   PT LONG TERM GOAL #8   Title Decrease Quick dash by >5% point to demonstrate reduced sense of disability. 02/11/2015   Baseline 23%   Time 4   Period Weeks   Status Achieved               Plan - 02/26/15 0851    Clinical Impression Statement Patient was instructed in advanced RUE shoulder ROM exercise. Patient required min Vcs for better positioning during ROM exercise for better flexibility and joint position. Patient responded well to cues. He responded well to manual therapy with overpressure at end range and joint mobs. Patient was able to demonstrate improved shoulder ROM by 10 degrees from start to finish of treatment session. However his ROM is still limited as compared to previous sessions. Patient would benefit from additional skilled PT intervention to improve ROM and reduce pain with ADLs.   Pt will benefit from skilled therapeutic intervention in order to improve on the following deficits Hypomobility;Impaired UE functional use;Decreased range of motion;Improper body mechanics;Postural dysfunction;Pain   Rehab Potential Good   Clinical Impairments Affecting Rehab Potential Positive: improvement, motivation; Negative: central signs, chronicity    PT Frequency 2x / week   PT Duration 4 weeks   PT Treatment/Interventions ADLs/Self Care Home Management;Electrical Stimulation;Moist Heat;Ultrasound;Cryotherapy;Functional mobility training;Therapeutic activities;Therapeutic exercise;Neuromuscular re-education;Manual techniques;Scar mobilization;Passive range of motion;Dry needling;Vestibular;Canalith Repostioning   PT Next Visit Plan ROM and manual therapy.    PT Home Exercise Plan continue as previously given    Consulted and Agree with Plan of Care  Patient        Problem List There are no active problems to display for this patient.   Hopkins,Margaret, PT, DPT 02/26/2015, 9:01 AM  [place] MAIN [facility] SERVICES [address] [place], [place], [address] Phone: [phone]   Fax:  [phone]

## 2015-02-27 ENCOUNTER — Encounter: Payer: Self-pay | Admitting: Physical Therapy

## 2015-02-27 ENCOUNTER — Ambulatory Visit: Payer: Medicare Other | Admitting: Physical Therapy

## 2015-02-27 DIAGNOSIS — M25511 Pain in right shoulder: Secondary | ICD-10-CM

## 2015-02-27 DIAGNOSIS — R29898 Other symptoms and signs involving the musculoskeletal system: Secondary | ICD-10-CM

## 2015-02-27 DIAGNOSIS — R531 Weakness: Secondary | ICD-10-CM | POA: Diagnosis not present

## 2015-02-27 NOTE — Therapy (Signed)
[place] MAIN [facility] SERVICES [address] [place], [place], [address] Phone: [phone]   Fax:  [phone]  Physical Therapy Treatment  Patient Details  Name: Howard Anderson MRN: [id number] Date of Birth: Jun 18, 1946 Referring Provider:  Thornton Park, MD  Encounter Date: 02/27/2015      PT End of Session - 02/28/15 0753    Visit Number 21  shoulder: 12   Number of Visits 19  Shoulder: 18   Date for PT Re-Evaluation 03/10/15  03/13/15   Authorization Type g code every 10th visit   Authorization - Visit Number 10   Authorization - Number of Visits 19   PT Start Time 0440   PT Stop Time 0525   PT Time Calculation (min) 45 min   Activity Tolerance Patient tolerated treatment well;Patient limited by pain   Behavior During Therapy [facility] for tasks assessed/performed      Past Medical History  Diagnosis Date  . Hypertension   . Heart disease   . AAA (abdominal aortic aneurysm) without rupture   . Chest pain   . Shoulder fracture, right   . COPD (chronic obstructive pulmonary disease)     History reviewed. No pertinent past surgical history.  There were no vitals filed for this visit.  Visit Diagnosis:  Weakness of shoulder  Pain in joint, shoulder region, right      Subjective Assessment - 02/27/15 1707    Subjective Patient reports that he is doing well on this day, states that he has no pain but tightness in the shoulder is present.    Patient is accompained by: Family member   Pertinent History Pt is awaiting AAA repair surgery, being seen for BPPV and shoulder pain loss of motion    Limitations Walking   Patient Stated Goals Increase ROM to allow participate in recreational activities such as throwing a ball or swinging a golf club.    Currently in Pain? No/denies   Pain Onset More than a month ago            TREATMENT: Initial ROM, RUE shoulder flexion 90 degrees, abduction: 85 degrees;  BUE UBE, backwards  only level 2 x3 min (unbilled);  RUE UE ranger, alphabet, A-Z (capital letters) x1 with cues for positioning and to maintain RUE elbow extension to improve shoulder ROM; RUE UE ranger in standing: large circles x2 min; circles clockwise/counterclockwise x1 min each (shoulder in 90 degrees flexion);  AAROM on door frame into flexion and abduction x6 each direction  AAROM in supine with cane x10 each direction with inferior mobilization for PT at end range, grade III 30 second bouts.  Supine shoulder flexion ROM 120 degrees.   PT performed manual therapy to patient's RUE shoulder: PROM in all directions with overpressure at end range x10 each (flex, abduction, IR/ER) Grade II-III inferior joint mobs, 10 sec bouts, x5 reps x3 sets with shoulder in 70-90 degrees abduction; PT performed inferior joint mobilization with movement: sitting: shoulder abduction and flexion x10 each with cues to reduce shoulder shrug during mobilization with movement.  Patient exhibits improved shoulder ROM following manual therapy: shoulder flexion: 100 degrees, abduction: 90 degrees                  PT Education - 02/28/15 0752    Education provided Yes   Education Details shoulder exercise/ROM, manual therapy   Person(s) Educated Patient   Methods Explanation;Demonstration;Tactile cues;Verbal cues   Comprehension Verbalized understanding;Returned demonstration;Verbal cues required;Tactile cues required  PT Long Term Goals - 02/18/15 1727    PT LONG TERM GOAL #5   Title Patient will demonstrate >110 degrees of shoulder abduction in order to allow patient to perform bathing tasks with greater ease. 02/11/2015   Baseline 90   Time 4   Period Weeks   Status Not Met   PT LONG TERM GOAL #6   Title Pt will demonstrate >25 degrees IR to allow for greater ease of washing back while bathing. 02/11/2015   Baseline 14   Time 4   Period Weeks   Status Achieved   PT LONG TERM GOAL #7    Title Increase shoulder flexion to >120 degrees to allow pt reach objects in cabinets at home. 02/11/2015   Baseline 100   Time 4   Period Weeks   Status Partially Met   PT LONG TERM GOAL #8   Title Decrease Quick dash by >5% point to demonstrate reduced sense of disability. 02/11/2015   Baseline 23%   Time 4   Period Weeks   Status Achieved               Plan - 02/28/15 0754    Clinical Impression Statement Patient was instructed in RUE shoulder ROM exercises on this day. PT was required to provide verbal and tactile instruction for proper exercise form to prevent compensation with movement at shoulder flexion and abduction and to facilitate proper scapular rhythm. Patient responded well to verbal instruction. Manual therapy joint mobilizations also applied by PT in order to improve GH joint motion in supine and in sitting. Patient demonstrates an increase of 5 degrees from start of PT to end of PT following manual therapy; ROM has improved since last PT visit, but still limited compared to prior sessions. Continued skilled PT is recommended to increase shoulder ROM and improve function with ADLs   Pt will benefit from skilled therapeutic intervention in order to improve on the following deficits Hypomobility;Impaired UE functional use;Decreased range of motion;Improper body mechanics;Postural dysfunction;Pain   Rehab Potential Good   Clinical Impairments Affecting Rehab Potential Positive: improvement, motivation; Negative: central signs, chronicity    PT Frequency 2x / week   PT Duration 4 weeks   PT Treatment/Interventions ADLs/Self Care Home Management;Electrical Stimulation;Moist Heat;Ultrasound;Cryotherapy;Functional mobility training;Therapeutic activities;Therapeutic exercise;Neuromuscular re-education;Manual techniques;Scar mobilization;Passive range of motion;Dry needling;Vestibular;Canalith Repostioning   PT Next Visit Plan ROM exercises and manual therapy.    PT Home Exercise  Plan continue as previously given    Consulted and Agree with Plan of Care Patient        Problem List There are no active problems to display for this patient.  Barrie Folk SPT 02/28/2015   10:01 AM  This entire session was performed under direct supervision and direction of a licensed therapist . I have personally read, edited and approve of the note as written.  Hopkins,Margaret 02/28/2015, 10:01 AM  [place] MAIN [facility] SERVICES [address] [place], [place], [address] Phone: [phone]   Fax:  [phone]

## 2015-02-28 ENCOUNTER — Ambulatory Visit: Payer: Medicare Other | Admitting: Physical Therapy

## 2015-03-01 ENCOUNTER — Other Ambulatory Visit: Payer: Self-pay | Admitting: Family Medicine

## 2015-03-01 DIAGNOSIS — E785 Hyperlipidemia, unspecified: Secondary | ICD-10-CM

## 2015-03-03 ENCOUNTER — Ambulatory Visit: Payer: Medicare Other | Attending: Orthopedic Surgery | Admitting: Physical Therapy

## 2015-03-03 ENCOUNTER — Encounter: Payer: Self-pay | Admitting: Physical Therapy

## 2015-03-03 DIAGNOSIS — R29818 Other symptoms and signs involving the nervous system: Secondary | ICD-10-CM | POA: Insufficient documentation

## 2015-03-03 DIAGNOSIS — R42 Dizziness and giddiness: Secondary | ICD-10-CM | POA: Diagnosis not present

## 2015-03-03 DIAGNOSIS — R29898 Other symptoms and signs involving the musculoskeletal system: Secondary | ICD-10-CM | POA: Diagnosis not present

## 2015-03-03 DIAGNOSIS — M25511 Pain in right shoulder: Secondary | ICD-10-CM | POA: Insufficient documentation

## 2015-03-03 NOTE — Therapy (Signed)
[place] MAIN [facility] SERVICES [address] [place], [place], [address] Phone: [phone]   Fax:  [phone]  Physical Therapy Treatment  Patient Details  Name: Howard HOLLIBAUGH MRN: [id number] Date of Birth: 1946-03-20 Referring Provider:  Thornton Park, MD  Encounter Date: 03/03/2015      PT End of Session - 03/03/15 1719    Visit Number 22  shoulder: 13   Number of Visits 19  Shoulder: 18   Date for PT Re-Evaluation 03/10/15  03/13/15   Authorization Type g code every 10th visit   Authorization - Visit Number 10   Authorization - Number of Visits 19   PT Start Time 1630   PT Stop Time 1715   PT Time Calculation (min) 45 min   Activity Tolerance Patient tolerated treatment well;Patient limited by pain   Behavior During Therapy [facility] for tasks assessed/performed      Past Medical History  Diagnosis Date  . Hypertension   . Heart disease   . AAA (abdominal aortic aneurysm) without rupture   . Chest pain   . Shoulder fracture, right   . COPD (chronic obstructive pulmonary disease)     History reviewed. No pertinent past surgical history.  There were no vitals filed for this visit.  Visit Diagnosis:  Weakness of shoulder  Pain in joint, shoulder region, right      Subjective Assessment - 03/03/15 1639    Subjective Patient reports that he is doing well on this day, but states that his shoulder is a little sore from doing exercises at home.    Patient is accompained by: Family member   Pertinent History Pt is awaiting AAA repair surgery, being seen for BPPV and shoulder pain loss of motion    Limitations Walking   Patient Stated Goals Increase ROM to allow participate in recreational activities such as throwing a ball or swinging a golf club.    Currently in Pain? Yes   Pain Score 1    Pain Location Shoulder   Pain Orientation Right   Pain Descriptors / Indicators Sore   Pain Type Chronic pain   Pain Onset More than a  month ago   Pain Frequency Intermittent          treatment:  Arm bike 4 minutes, level 3.5 (unbilled)   Shoulder AAROM with pulleys in flexion, 2x15 with over pressure at end range Shoulder AAROM with pulleys in abduction 2x12 with overpressure at end range  UE ranger capital letters - Alphabet x1  UE ranger large circles x10  UE ranger shoulder arches x 10  Supine shoulder abduction x15  PT required to provide constant verbal and tactile instruction to prevent compensation from trunk and elbow heavy tactile cues also provided to assist with scapular rhythm in flexion and abduction. Cues also provided to minimize shoulder shrug  Seated mobilization with movement:  Flexion with inferior/posterior glide with grade III mobilizations  x12 Abduction with inferior glide with grade 3 mobilization x12   PT performed Manual therapy to patient's RUE shoulder including:  PROM of RUE into: shoulder flexion abduction/adduction, IR/ER  Grade III inferior/posterior joint mobs to RUE glenohumeral joint 10 sec bouts x 6  Patient reports that his shoulder felt "looser" after manual therapy, but a little sore.   Hard end feel still noted with shoulder abduction at roughly 90 degrees.  PT Education - 03/03/15 1718    Education provided Yes   Education Details shoulder ROM exercise, manula therapy    Person(s) Educated Patient   Methods Explanation;Tactile cues;Demonstration;Verbal cues   Comprehension Verbalized understanding;Returned demonstration;Verbal cues required;Tactile cues required             PT Long Term Goals - 02/18/15 1727    PT LONG TERM GOAL #5   Title Patient will demonstrate >110 degrees of shoulder abduction in order to allow patient to perform bathing tasks with greater ease. 02/11/2015   Baseline 90   Time 4   Period Weeks   Status Not Met   PT LONG TERM GOAL #6   Title Pt will demonstrate >25 degrees IR to allow for  greater ease of washing back while bathing. 02/11/2015   Baseline 14   Time 4   Period Weeks   Status Achieved   PT LONG TERM GOAL #7   Title Increase shoulder flexion to >120 degrees to allow pt reach objects in cabinets at home. 02/11/2015   Baseline 100   Time 4   Period Weeks   Status Partially Met   PT LONG TERM GOAL #8   Title Decrease Quick dash by >5% point to demonstrate reduced sense of disability. 02/11/2015   Baseline 23%   Time 4   Period Weeks   Status Achieved               Plan - 03/03/15 1720    Clinical Impression Statement Patient instructed in shoulder ROM exercises. PT required to provide verbal and tactile instruction to prevent compensation from the trunk and the elbow as well as assist with proper scapular rhythm. Patient continues to demonstrate significant shoulder shrug with flexion and abduction, which is slightly improved with verbal instruction. Manual therapy performed by PT to improve GH inferior glide; hard end feel still noted in approx. 90 degrees of abduction. Continued skilled PT is recommended to improve shoulder ROM and to increase strength to allow greater function with ADLs.   Pt will benefit from skilled therapeutic intervention in order to improve on the following deficits Hypomobility;Impaired UE functional use;Decreased range of motion;Improper body mechanics;Postural dysfunction;Pain   Rehab Potential Good   Clinical Impairments Affecting Rehab Potential Positive: improvement, motivation; Negative: central signs, chronicity    PT Frequency 2x / week   PT Duration 4 weeks   PT Treatment/Interventions ADLs/Self Care Home Management;Electrical Stimulation;Moist Heat;Ultrasound;Cryotherapy;Functional mobility training;Therapeutic activities;Therapeutic exercise;Neuromuscular re-education;Manual techniques;Scar mobilization;Passive range of motion;Dry needling;Vestibular;Canalith Repostioning   PT Next Visit Plan ROM exercises and manual  therapy.    PT Home Exercise Plan continue as previously given    Consulted and Agree with Plan of Care Patient        Problem List There are no active problems to display for this patient. Barrie Folk, SPT This entire session was performed under direct supervision and direction of a licensed therapist. I have personally read, edited and approve of the note as written.   Hopkins,Margaret , PT, DPT  03/04/2015, 8:22 AM  [place] MAIN [facility] SERVICES [address] [place], [place], [address] Phone: [phone]   Fax:  [phone]

## 2015-03-05 ENCOUNTER — Encounter: Payer: Medicare Other | Admitting: Physical Therapy

## 2015-03-06 ENCOUNTER — Other Ambulatory Visit: Payer: Self-pay | Admitting: Family Medicine

## 2015-03-06 ENCOUNTER — Ambulatory Visit: Payer: Medicare Other | Admitting: Physical Therapy

## 2015-03-06 DIAGNOSIS — R29818 Other symptoms and signs involving the nervous system: Secondary | ICD-10-CM | POA: Diagnosis not present

## 2015-03-06 DIAGNOSIS — R42 Dizziness and giddiness: Secondary | ICD-10-CM

## 2015-03-06 DIAGNOSIS — I10 Essential (primary) hypertension: Secondary | ICD-10-CM

## 2015-03-06 DIAGNOSIS — M25511 Pain in right shoulder: Secondary | ICD-10-CM | POA: Diagnosis not present

## 2015-03-06 DIAGNOSIS — R29898 Other symptoms and signs involving the musculoskeletal system: Secondary | ICD-10-CM | POA: Diagnosis not present

## 2015-03-06 DIAGNOSIS — R2689 Other abnormalities of gait and mobility: Secondary | ICD-10-CM

## 2015-03-06 NOTE — Therapy (Signed)
[place] MAIN [facility] SERVICES [address] [place], [place], [address] Phone: [phone]   Fax:  [phone]  Physical Therapy Treatment  Patient Details  Name: Howard Anderson MRN: [id number] Date of Birth: May 11, 1946 Referring Provider:  Beverly Gust, MD  Encounter Date: 03/06/2015      PT End of Session - 03/06/15 1509    Visit Number 23   Date for PT Re-Evaluation 03/10/15   Authorization Type g code every 10th visit   Authorization - Visit Number 11   Authorization - Number of Visits 19   PT Start Time 8329   PT Stop Time 1505   PT Time Calculation (min) 43 min   Activity Tolerance Patient tolerated treatment well   Behavior During Therapy WFL for tasks assessed/performed      Past Medical History  Diagnosis Date  . Hypertension   . Heart disease   . AAA (abdominal aortic aneurysm) without rupture   . Chest pain   . Shoulder fracture, right   . COPD (chronic obstructive pulmonary disease)     No past surgical history on file.  There were no vitals filed for this visit.  Visit Diagnosis:  Dizziness and giddiness  Balance problem      Subjective Assessment - 03/06/15 1429    Subjective (p) Pt states that his dizziness is much better. Pt states he is having fewer and less intense when he does have an episode. Pt reports that he has been doing his exercises. Pt reports he has been doing self-Epley manuever at home without difficulty. Pt states where he is at now "is probably where he was at prior to when I fell". Pt reports that he can go several days without episodes of dizziness and takes about 30-60 seconds to dissipate.    Pertinent History (p) Pt is awaiting AAA repair surgery, being seen for BPPV and shoulder pain loss of motion    Patient Stated Goals (p) Increase ROM to allow participate in recreational activities such as throwing a ball or swinging a golf club.       Canalith Repositioning Maneuver: Pt  performed Right Dix-Hallpike test and this was positive with right upbeating, torsional nystagmus of short duration. Pt reported vertigo. Pt performed self-Epley maneuver using pillows across upper back to extend back upon secondary to limited cervical ROM and mobility issues. Pt required cuing for proper technique with self-Epley. Pt performed a second right Dix-Hallpike test and noted 4 beats of nystagmus and pt reported that "I could not even really feel that one" and then performed self-Epley.  Reviewed technique of self-Epley maneuver with patient and recommended trying to perform self-Epley at least once daily and trying to sleep on his left side with right arm supported on pillows for comfort to see if this helps with clearance of crystals as well.  Pt in agreement.         PT Education - 03/06/15 1520    Education provided Yes   Education Details Reviewed self Epley maneuver; discussed trying to sleep on left side with right arm supported for comfort   Person(s) Educated Patient   Methods Demonstration;Explanation;Verbal cues   Comprehension Returned demonstration;Verbalized understanding;Verbal cues required             PT Long Term Goals - 02/18/15 1727    PT LONG TERM GOAL #5   Title Patient will demonstrate >110 degrees of shoulder abduction in order to allow patient to perform bathing tasks with greater  ease. 02/11/2015   Baseline 90   Time 4   Period Weeks   Status Not Met   PT LONG TERM GOAL #6   Title Pt will demonstrate >25 degrees IR to allow for greater ease of washing back while bathing. 02/11/2015   Baseline 14   Time 4   Period Weeks   Status Achieved   PT LONG TERM GOAL #7   Title Increase shoulder flexion to >120 degrees to allow pt reach objects in cabinets at home. 02/11/2015   Baseline 100   Time 4   Period Weeks   Status Partially Met   PT LONG TERM GOAL #8   Title Decrease Quick dash by >5% point to demonstrate reduced sense of disability. 02/11/2015    Baseline 23%   Time 4   Period Weeks   Status Achieved               Plan - 03/06/15 1523    Clinical Impression Statement Patient with persistent positive right Dix-hallpike despite repeated manuevers. Pt was taught self-Epley for right ear last session and returned to clinic this date and pt required cuing for technique. Pt has difficulty performing secondary to his mobility status, limited cervical ROM and right shoulder mobility/ROM which might impact patient's ability to perform self-Epley with proper technique independently. Recommended to patient that he continue to try to perform self-Epley using pillows at home and to try to lie on his left side at night when sleeping with pillow under right arm for support and comfort as needed. Pt in agreement. Recommended that patient follow up with  Dr. Tami Ribas secondary to persistent positive right Dix-hallpike test. Pt is recieving PT services for R shoulder as well.    Pt will benefit from skilled therapeutic intervention in order to improve on the following deficits Decreased balance;Dizziness   Rehab Potential Good   Clinical Impairments Affecting Rehab Potential Positive: improvement, motivation; Negative: central signs, chronicity    PT Frequency 1x / week   PT Duration 6 weeks   PT Treatment/Interventions ADLs/Self Care Home Management;Electrical Stimulation;Moist Heat;Ultrasound;Cryotherapy;Functional mobility training;Therapeutic activities;Therapeutic exercise;Neuromuscular re-education;Manual techniques;Scar mobilization;Passive range of motion;Dry needling;Vestibular;Canalith Repostioning   PT Next Visit Plan Have pt demonstrate Marye Round followed by Epley to recheck for BPPV and assess competence. If negative review and perform HEP as well as high level balance exercises.    PT Home Exercise Plan Single leg stance, tandem gait, gaze stabilization exercise (VOR X1)   Consulted and Agree with Plan of Care Patient         Problem List There are no active problems to display for this patient.  Lady Deutscher PT, DPT Lady Deutscher 03/06/2015, 3:30 PM  [place] MAIN [facility] SERVICES [address] [place], [place], [address] Phone: [phone]   Fax:  [phone]

## 2015-03-11 ENCOUNTER — Ambulatory Visit: Payer: Medicare Other | Admitting: Physical Therapy

## 2015-03-11 ENCOUNTER — Encounter: Payer: Self-pay | Admitting: Physical Therapy

## 2015-03-11 DIAGNOSIS — R42 Dizziness and giddiness: Secondary | ICD-10-CM | POA: Diagnosis not present

## 2015-03-11 DIAGNOSIS — R29898 Other symptoms and signs involving the musculoskeletal system: Secondary | ICD-10-CM | POA: Diagnosis not present

## 2015-03-11 DIAGNOSIS — M25511 Pain in right shoulder: Secondary | ICD-10-CM

## 2015-03-11 DIAGNOSIS — R29818 Other symptoms and signs involving the nervous system: Secondary | ICD-10-CM | POA: Diagnosis not present

## 2015-03-11 NOTE — Therapy (Signed)
[name] MAIN [facility] SERVICES [address] [place], [place], [address] Phone: [phone]   Fax:  [phone]  Physical Therapy Treatment  Patient Details  Name: Howard Anderson MRN: [id number] Date of Birth: Nov 08, 1945 Referring Provider:  Thornton Park, MD  Encounter Date: 03/11/2015      PT End of Session - 03/11/15 0748    Visit Number 24  shoulder: 14   Number of Visits 19  Shoulder: 18   Date for PT Re-Evaluation 03/10/15  03/13/15   Authorization Type g code every 10th visit   Authorization - Visit Number 10   Authorization - Number of Visits 19   PT Start Time 5397   PT Stop Time 1737   PT Time Calculation (min) 47 min   Activity Tolerance Patient tolerated treatment well;Patient limited by pain   Behavior During Therapy [facility] for tasks assessed/performed      Past Medical History  Diagnosis Date  . Hypertension   . Heart disease   . AAA (abdominal aortic aneurysm) without rupture   . Chest pain   . Shoulder fracture, right   . COPD (chronic obstructive pulmonary disease)     History reviewed. No pertinent past surgical history.  There were no vitals filed for this visit.  Visit Diagnosis:  Weakness of shoulder  Pain in joint, shoulder region, right      Subjective Assessment - 03/11/15 1658    Subjective Patient arrived late to PT treatement on this day, he reports that he had a difficulty day. He also states that his shoulder feels moderately tight/stiff today, but no pain,   Patient is accompained by: Family member   Pertinent History Pt is awaiting AAA repair surgery, being seen for BPPV and shoulder pain loss of motion    Limitations Walking   Patient Stated Goals Increase ROM to allow participate in recreational activities such as throwing a ball or swinging a golf club.    Currently in Pain? No/denies   Pain Onset More than a month ago           R Shoulder ROM pre treatment. Abduction: 84. Flexion  98 R Shoulder ROM post treatment abduction: 89. Flexion 104    treatment:  Arm bike 3 minutes, level 3.5 (unbilled)   Shoulder AAROM with finger ladder in flexion, 2x10 with over pressure at end range Shoulder AAROM with finger ladder in abduction 2x12 with overpressure at end range  UE ranger capital letters - Alphabet x1  R UE shoulder flexion with overpressure on mirror with sheetx10 R UE shoulder abduction with overpressure on mirror with sheetx10 R UE circles on mirror with sheet to decrease friction x10  shoulder arches on mirror with sheet to decreased friction x 10  Supine shoulder abduction x15  PT required to provide moderate verbal and tactile instruction to prevent compensation from trunk and elbow; Heavy tactile cues also provided to assist with scapular rhythm in all shoulder overhead motions. Cues also provided to minimize shoulder shrug  Seated mobilization with movement:  Flexion with inferior/posterior glide with grade III mobilizations x8 Abduction with inferior glide with grade 3 mobilization x8  PT performed Manual therapy to patient's RUE shoulder including:  PROM of RUE into: shoulder flexion abduction/adduction, IR/ER  Grade III inferior/posterior joint mobs to RUE glenohumeral joint 10 sec bouts x 8  Patient reports that his shoulder felt "less tight" following manual therapy.  PT Education - 03/11/15 0747    Education provided Yes   Education Details Shoulder ROM, manual therapy    Person(s) Educated Patient   Methods Explanation;Demonstration;Verbal cues;Tactile cues   Comprehension Verbalized understanding;Returned demonstration;Verbal cues required;Tactile cues required             PT Long Term Goals - 02/18/15 1727    PT LONG TERM GOAL #5   Title Patient will demonstrate >110 degrees of shoulder abduction in order to allow patient to perform bathing tasks with greater ease. 02/11/2015    Baseline 90   Time 4   Period Weeks   Status Not Met   PT LONG TERM GOAL #6   Title Pt will demonstrate >25 degrees IR to allow for greater ease of washing back while bathing. 02/11/2015   Baseline 14   Time 4   Period Weeks   Status Achieved   PT LONG TERM GOAL #7   Title Increase shoulder flexion to >120 degrees to allow pt reach objects in cabinets at home. 02/11/2015   Baseline 100   Time 4   Period Weeks   Status Partially Met   PT LONG TERM GOAL #8   Title Decrease Quick dash by >5% point to demonstrate reduced sense of disability. 02/11/2015   Baseline 23%   Time 4   Period Weeks   Status Achieved               Plan - 03/12/15 0749    Clinical Impression Statement Patient instructed in shoulder ROM exercises on this day. PT provided verbal and tactile instruction for proper exercise positioning and proper scapular rhythm with all shoulder movement. PT also performed manual therapy in supine including PROM and grade III GH joint mobilizations and in sitting with mobilizations with movement. The patient notes increased pain with end range shoulder abduction in the [facility] joint. He also reports that he feels like his shoulder is stronger and is moving more, but upon measurement, patient demonstrates decreased shoulder ROM in flexion and abduction compared to previous sessions. Continued skilled PT is recommended to increase shoulder ROM, and strength and decreased pain with overhead motions on the R UE to allow greater independence with ADLs.     Pt will benefit from skilled therapeutic intervention in order to improve on the following deficits Hypomobility;Impaired UE functional use;Decreased range of motion;Improper body mechanics;Postural dysfunction;Pain   Rehab Potential Good   Clinical Impairments Affecting Rehab Potential Positive: improvement, motivation; Negative: central signs, chronicity    PT Frequency 2x / week   PT Duration 4 weeks   PT Treatment/Interventions  ADLs/Self Care Home Management;Electrical Stimulation;Moist Heat;Ultrasound;Cryotherapy;Functional mobility training;Therapeutic activities;Therapeutic exercise;Neuromuscular re-education;Manual techniques;Scar mobilization;Passive range of motion;Dry needling;Vestibular;Canalith Repostioning   PT Next Visit Plan ROM exercises and manual therapy.    PT Home Exercise Plan continue as previously given    Consulted and Agree with Plan of Care Patient        Problem List There are no active problems to display for this patient. Barrie Folk, SPT This entire session was performed under direct supervision and direction of a licensed therapist . I have personally read, edited and approve of the note as written.   Hopkins,Margaret, PT, DPT 03/12/2015, 8:41 AM  [place] MAIN [facility] SERVICES [address] [place], [place], [address] Phone: [phone]   Fax:  [phone]

## 2015-03-13 ENCOUNTER — Ambulatory Visit: Payer: Medicare Other | Admitting: Physical Therapy

## 2015-03-13 ENCOUNTER — Encounter: Payer: Self-pay | Admitting: Physical Therapy

## 2015-03-13 DIAGNOSIS — R42 Dizziness and giddiness: Secondary | ICD-10-CM | POA: Diagnosis not present

## 2015-03-13 DIAGNOSIS — M25511 Pain in right shoulder: Secondary | ICD-10-CM | POA: Diagnosis not present

## 2015-03-13 DIAGNOSIS — R29898 Other symptoms and signs involving the musculoskeletal system: Secondary | ICD-10-CM

## 2015-03-13 DIAGNOSIS — R29818 Other symptoms and signs involving the nervous system: Secondary | ICD-10-CM | POA: Diagnosis not present

## 2015-03-13 NOTE — Therapy (Signed)
[place] MAIN [facility] SERVICES [address] [place], [place], [address] Phone: [phone]   Fax:  [phone]  Physical Therapy Treatment/Discharge Summary  01/14/2015 - 03/13/2015   Patient Details  Name: Howard Anderson MRN: [id number] Date of Birth: 1946/07/04 Referring Provider:  Thornton Park, MD  Encounter Date: 03/13/2015      PT End of Session - 03/14/15 0754    Visit Number 25  shoulder: 15   Number of Visits 19  Shoulder: 18   Date for PT Re-Evaluation 03/10/15  03/13/15   Authorization Type g code every 10th visit   Authorization - Visit Number 10   Authorization - Number of Visits 19   PT Start Time 2035   PT Stop Time 1740   PT Time Calculation (min) 55 min   Activity Tolerance Patient tolerated treatment well;Patient limited by pain   Behavior During Therapy [facility] for tasks assessed/performed      Past Medical History  Diagnosis Date  . Hypertension   . Heart disease   . AAA (abdominal aortic aneurysm) without rupture   . Chest pain   . Shoulder fracture, right   . COPD (chronic obstructive pulmonary disease)     History reviewed. No pertinent past surgical history.  There were no vitals filed for this visit.  Visit Diagnosis:  Weakness of shoulder  Pain in joint, shoulder region, right      Subjective Assessment - 03/13/15 1656    Subjective Patient reports that he is a little sore in the shoulder but there is no pain .    Patient is accompained by: Family member   Pertinent History Pt is awaiting AAA repair surgery, being seen for BPPV and shoulder pain loss of motion    Limitations Walking   Patient Stated Goals Increase ROM to allow participate in recreational activities such as throwing a ball or swinging a golf club.    Pain Onset More than a month ago            [facility] PT Assessment - 03/14/15 0001    Observation/Other Assessments   Quick DASH  20% impaired. (increased impairment from  02/18/2015)    AROM   Overall AROM Comments Patient demonstrates only slight improvement since 01/14/15 with 8 degree improvement in shoulder flexion, 3 degree improvement in abduction, decreased ER by 5 degrees. decreased IR from 02/18/15 by 5 degrees, but improved by 31 degrees from 01/14/15    Right Shoulder Flexion 108 Degrees   Right Shoulder ABduction 93 Degrees   Right Shoulder Internal Rotation 45 Degrees   Right Shoulder External Rotation 35 Degrees   Strength   Right Shoulder Flexion 4/5   Right Shoulder Extension 4+/5   Right Shoulder ABduction 4-/5   Right Shoulder Internal Rotation 4/5   Right Shoulder External Rotation 4/5   Left Shoulder Flexion 5/5   Left Shoulder Extension 4+/5   Left Shoulder ABduction 5/5   Left Shoulder Internal Rotation 4+/5   Left Shoulder External Rotation 4+/5      Arm bike 2 minutes, level 3, (unbilled)   Pre treatment: flexion 105, abduction 93 Post treatment ROM: see above  shoulder flexion with yellow tband x10  [place] IR yellow tband x10  Shoulder ER yellow taband x10  UE ranger capital letters - Alphabet x1  R UE shoulder flexion with overpressure on mirror with sheetx10 R UE shoulder abduction with overpressure on mirror with sheetx10 R UE circles on mirror with sheet to decrease  friction x10  shoulder arches on mirror with sheet to decreased friction x 10   PT required to provide moderate verbal and tactile instruction to prevent compensation from trunk and elbow; Heavy tactile cues also provided to assist with scapular rhythm in all shoulder overhead motions. Cues also provided to minimize shoulder shrug  Seated mobilization with movement:  Flexion with inferior/posterior glide with grade III mobilizations x10 Abduction with inferior glide with grade 3 mobilization x10  PT performed Manual therapy to patient's RUE shoulder in sitting including:  PROM of RUE into: shoulder flexion abduction/adduction, IR/ER  Grade III  inferior/posterior joint mobs to RUE glenohumeral joint 20 sec bouts x 8  ROM also assessed in sitting for shoulder flexion and abduction, and in supine for shoulder ER and IR.  Patient reports that his shoulder felt "less tight" following manual therapy, but a little sore.                  PT Education - 03/14/15 0754    Education provided Yes   Education Details Shoulder ROM, manual therapy, discharge plan.    Person(s) Educated Patient   Methods Explanation;Demonstration;Tactile cues;Verbal cues   Comprehension Verbalized understanding;Returned demonstration;Verbal cues required;Tactile cues required             PT Long Term Goals - 03/13/15 1738    PT LONG TERM GOAL #5   Title Patient will demonstrate >110 degrees of shoulder abduction in order to allow patient to perform bathing tasks with greater ease. 02/11/2015   Baseline 90   Time 4   Period Weeks   Status Not Met   PT LONG TERM GOAL #6   Title Pt will demonstrate >25 degrees IR to allow for greater ease of washing back while bathing. 02/11/2015   Baseline 14   Time 4   Period Weeks   Status Achieved   PT LONG TERM GOAL #7   Title Increase shoulder flexion to >120 degrees to allow pt reach objects in cabinets at home. 02/11/2015   Baseline 100   Time 4   Period Weeks   Status Not Met   PT LONG TERM GOAL #8   Title Decrease Quick dash by >5% point to demonstrate reduced sense of disability. 02/11/2015   Baseline 23%   Time 4   Period Weeks   Status Not Met               Plan - 03/14/15 0755    Clinical Impression Statement PT instructed patient in shoulder ROM exercises as well as mobilization with movement; Moderate verbal and tactile instruction provided to decrease compensation from trunk and improve scapular rhythm with all shoulder movement. Patient demonstrates slight improvement with scapular rhythm on this day with decreased shoulder shrug with attempted overhead movements. Patient goals  also assessed. Patient demonstrates only slight improvement in Shoulder ROM and no improvement in strength since start of PT on 01/14/2015. Unable to achieve ROM goals for shoulder flexion, abduction, or goal for perceived impairment from shoulder dysfunction. There is increased perceived impairment from last PT assessment on 02/18/2015 as evidenced by the Quick DASH. Due to lack of progress with PT towards ROM goals or improved function, continued PT is no longer recommended at this time.   Pt will benefit from skilled therapeutic intervention in order to improve on the following deficits Hypomobility;Impaired UE functional use;Decreased range of motion;Improper body mechanics;Postural dysfunction;Pain   Rehab Potential Good   Clinical Impairments Affecting Rehab Potential Positive: improvement, motivation;  Negative: central signs, chronicity    PT Frequency 2x / week   PT Duration 4 weeks   PT Treatment/Interventions ADLs/Self Care Home Management;Electrical Stimulation;Moist Heat;Ultrasound;Cryotherapy;Functional mobility training;Therapeutic activities;Therapeutic exercise;Neuromuscular re-education;Manual techniques;Scar mobilization;Passive range of motion;Dry needling;Vestibular;Canalith Repostioning   PT Next Visit Plan Pateint discharge from PT due to lack of progress   PT Home Exercise Plan continue as previously given    Consulted and Agree with Plan of Care Patient        Problem List There are no active problems to display for this patient.  Barrie Folk SPT 03/14/2015   8:49 AM  This entire session was performed under direct supervision and direction of a licensed therapist . I have personally read, edited and approve of the note as written.  Hopkins,Margaret, PT, DPT 03/14/2015, 8:49 AM  [place] MAIN [facility] SERVICES [address] [place], [place], [address] Phone: [phone]   Fax:  [phone]

## 2015-03-18 DIAGNOSIS — H4011X1 Primary open-angle glaucoma, mild stage: Secondary | ICD-10-CM | POA: Diagnosis not present

## 2015-03-20 ENCOUNTER — Other Ambulatory Visit: Payer: Self-pay | Admitting: Family Medicine

## 2015-03-20 DIAGNOSIS — E785 Hyperlipidemia, unspecified: Secondary | ICD-10-CM

## 2015-03-25 ENCOUNTER — Other Ambulatory Visit: Payer: Self-pay | Admitting: Family Medicine

## 2015-03-25 DIAGNOSIS — K219 Gastro-esophageal reflux disease without esophagitis: Secondary | ICD-10-CM

## 2015-03-25 DIAGNOSIS — H4011X1 Primary open-angle glaucoma, mild stage: Secondary | ICD-10-CM | POA: Diagnosis not present

## 2015-03-30 ENCOUNTER — Other Ambulatory Visit: Payer: Self-pay | Admitting: Family Medicine

## 2015-03-30 DIAGNOSIS — E785 Hyperlipidemia, unspecified: Secondary | ICD-10-CM

## 2015-04-10 ENCOUNTER — Other Ambulatory Visit: Payer: Self-pay | Admitting: Vascular Surgery

## 2015-04-10 DIAGNOSIS — I714 Abdominal aortic aneurysm, without rupture, unspecified: Secondary | ICD-10-CM

## 2015-04-10 DIAGNOSIS — J449 Chronic obstructive pulmonary disease, unspecified: Secondary | ICD-10-CM | POA: Diagnosis not present

## 2015-04-10 DIAGNOSIS — E669 Obesity, unspecified: Secondary | ICD-10-CM | POA: Diagnosis not present

## 2015-04-10 DIAGNOSIS — E785 Hyperlipidemia, unspecified: Secondary | ICD-10-CM | POA: Diagnosis not present

## 2015-04-10 DIAGNOSIS — I10 Essential (primary) hypertension: Secondary | ICD-10-CM | POA: Diagnosis not present

## 2015-04-10 DIAGNOSIS — M1990 Unspecified osteoarthritis, unspecified site: Secondary | ICD-10-CM | POA: Diagnosis not present

## 2015-04-17 ENCOUNTER — Ambulatory Visit: Payer: Medicare Other

## 2015-04-17 ENCOUNTER — Other Ambulatory Visit: Payer: Self-pay | Admitting: Family Medicine

## 2015-04-17 DIAGNOSIS — E785 Hyperlipidemia, unspecified: Secondary | ICD-10-CM

## 2015-04-18 ENCOUNTER — Ambulatory Visit
Admission: RE | Admit: 2015-04-18 | Discharge: 2015-04-18 | Disposition: A | Discharge status: Home / Self Care | Payer: Medicare Other | Source: Ambulatory Visit | Attending: Vascular Surgery | Admitting: Vascular Surgery

## 2015-04-18 DIAGNOSIS — I714 Abdominal aortic aneurysm, without rupture, unspecified: Secondary | ICD-10-CM

## 2015-04-18 MED ORDER — IOHEXOL 350 MG/ML SOLN
125.0000 mL | Freq: Once | INTRAVENOUS | Status: AC | PRN
  Administered 2015-04-18: 125 mL via INTRAVENOUS

## 2015-04-24 ENCOUNTER — Encounter: Payer: Self-pay | Admitting: Family Medicine

## 2015-04-24 ENCOUNTER — Ambulatory Visit (INDEPENDENT_AMBULATORY_CARE_PROVIDER_SITE_OTHER): Payer: Medicare Other | Admitting: Family Medicine

## 2015-04-24 ENCOUNTER — Ambulatory Visit
Admission: RE | Admit: 2015-04-24 | Discharge: 2015-04-24 | Disposition: A | Discharge status: Home / Self Care | Payer: Medicare Other | Source: Ambulatory Visit | Attending: Family Medicine | Admitting: Family Medicine

## 2015-04-24 VITALS — BP 120/80 | HR 80 | Ht 67.0 in | Wt 204.0 lb

## 2015-04-24 DIAGNOSIS — M47816 Spondylosis without myelopathy or radiculopathy, lumbar region: Secondary | ICD-10-CM | POA: Diagnosis not present

## 2015-04-24 DIAGNOSIS — N400 Enlarged prostate without lower urinary tract symptoms: Secondary | ICD-10-CM | POA: Diagnosis not present

## 2015-04-24 DIAGNOSIS — N393 Stress incontinence (female) (male): Secondary | ICD-10-CM | POA: Insufficient documentation

## 2015-04-24 DIAGNOSIS — M549 Dorsalgia, unspecified: Secondary | ICD-10-CM | POA: Diagnosis not present

## 2015-04-24 DIAGNOSIS — M5136 Other intervertebral disc degeneration, lumbar region: Secondary | ICD-10-CM

## 2015-04-24 DIAGNOSIS — M8580 Other specified disorders of bone density and structure, unspecified site: Secondary | ICD-10-CM | POA: Diagnosis not present

## 2015-04-24 DIAGNOSIS — M899 Disorder of bone, unspecified: Secondary | ICD-10-CM | POA: Diagnosis not present

## 2015-04-24 DIAGNOSIS — I714 Abdominal aortic aneurysm, without rupture: Secondary | ICD-10-CM | POA: Insufficient documentation

## 2015-04-24 DIAGNOSIS — I719 Aortic aneurysm of unspecified site, without rupture: Secondary | ICD-10-CM

## 2015-04-24 MED ORDER — TRAMADOL HCL 50 MG PO TABS: 50.0000 mg | ORAL_TABLET | Freq: Three times a day (TID) | ORAL | Status: DC | PRN

## 2015-04-24 NOTE — Progress Notes (Signed)
Name: Howard Anderson   MRN: [id number]    DOB: [birth year]   Date:04/24/2015       Progress Note  Subjective  Chief Complaint  Chief Complaint  Patient presents with  . Back Pain    Back Pain This is a new problem. The current episode started in the past 7 days. The problem occurs constantly. The problem is unchanged. The pain is present in the lumbar spine. The quality of the pain is described as aching and stabbing. The pain radiates to the left thigh ("stinging"). The pain is at a severity of 7/10. The pain is moderate. The pain is worse during the day. The symptoms are aggravated by bending. Associated symptoms include bladder incontinence, leg pain, paresthesias and tingling. Pertinent negatives include no abdominal pain, bowel incontinence, chest pain, dysuria, fever, headaches, numbness, paresis, pelvic pain, perianal numbness, weakness or weight loss. Risk factors include history of cancer, menopause and obesity. Treatments tried: icy hot sauve. The treatment provided no relief.    No problem-specific assessment & plan notes found for this encounter.   Past Medical History  Diagnosis Date  . Hypertension   . Heart disease   . AAA (abdominal aortic aneurysm) without rupture   . Chest pain   . Shoulder fracture, right   . COPD (chronic obstructive pulmonary disease)     Past Surgical History  Procedure Laterality Date  . Colonoscopy  2010    History reviewed. No pertinent family history.  Social History   Social History  . Marital Status: Married    Spouse Name: N/A  . Number of Children: N/A  . Years of Education: N/A   Occupational History  . Not on file.   Social History Main Topics  . Smoking status: Former Smoker -- 1.50 packs/day for 48 years    Quit date: 10/10/2013  . Smokeless tobacco: Not on file  . Alcohol Use: No  . Drug Use: Not on file  . Sexual Activity: Not on file   Other Topics Concern  . Not on file   Social History Narrative     Allergies  Allergen Reactions  . No Known Allergies      Review of Systems  Constitutional: Negative for fever, chills, weight loss and malaise/fatigue.  HENT: Negative for ear discharge, ear pain and sore throat.   Eyes: Negative for blurred vision.  Respiratory: Negative for cough, sputum production, shortness of breath and wheezing.   Cardiovascular: Negative for chest pain, palpitations and leg swelling.  Gastrointestinal: Negative for heartburn, nausea, abdominal pain, diarrhea, constipation, blood in stool, melena and bowel incontinence.  Genitourinary: Positive for bladder incontinence. Negative for dysuria, urgency, frequency, hematuria and pelvic pain.  Musculoskeletal: Positive for back pain. Negative for myalgias, joint pain and neck pain.  Skin: Negative for rash.  Neurological: Positive for tingling and paresthesias. Negative for dizziness, sensory change, focal weakness, weakness, numbness and headaches.  Endo/Heme/Allergies: Negative for environmental allergies and polydipsia. Does not bruise/bleed easily.  Psychiatric/Behavioral: Negative for depression and suicidal ideas. The patient is not nervous/anxious and does not have insomnia.      Objective  Filed Vitals:   04/24/15 1410  BP: 120/80  Pulse: 80  Height: 5\' 7"  (1.702 m)  Weight: 204 lb (92.534 kg)    Physical Exam  Constitutional: He is oriented to person, place, and time and well-developed, well-nourished, and in no distress.  HENT:  Head: Normocephalic.  Right Ear: External ear normal.  Left Ear: External ear normal.  Nose:  Nose normal.  Mouth/Throat: Oropharynx is clear and moist.  Eyes: Conjunctivae and EOM are normal. Pupils are equal, round, and reactive to light. Right eye exhibits no discharge. Left eye exhibits no discharge. No scleral icterus.  Neck: Normal range of motion. Neck supple. No JVD present. No tracheal deviation present. No thyromegaly present.  Cardiovascular: Normal rate,  regular rhythm, normal heart sounds and intact distal pulses.  Exam reveals no gallop and no friction rub.   No murmur heard. Pulmonary/Chest: Breath sounds normal. No respiratory distress. He has no wheezes. He has no rales.  Abdominal: Soft. Bowel sounds are normal. He exhibits no mass. There is no hepatosplenomegaly. There is no tenderness. There is no rebound, no guarding and no CVA tenderness.  Genitourinary: Rectum normal and prostate normal.  Musculoskeletal: Normal range of motion. He exhibits no edema or tenderness.  Lymphadenopathy:    He has no cervical adenopathy.  Neurological: He is alert and oriented to person, place, and time. He has normal sensation, normal strength, normal reflexes and intact cranial nerves. No cranial nerve deficit.  Skin: Skin is warm. No rash noted.  Psychiatric: Mood and affect normal.  Nursing note and vitals reviewed.     Assessment & Plan  Problem List Items Addressed This Visit      Musculoskeletal and Integument   Degenerative disc disease, lumbar - Primary   Relevant Medications   traMADol (ULTRAM) 50 MG tablet   Other Relevant Orders   DG Lumbar Spine Complete   DG Bone Density     Other   Urinary incontinence, male, stress   Relevant Orders   Ambulatory referral to Urology   PSA    Other Visit Diagnoses    BPH (benign prostatic hypertrophy)        Relevant Orders    DG Bone Density    PSA    Borderline osteopenia        Relevant Orders    DG Bone Density    Osteopenia             Dr. Deanna [facility]  04/24/2015

## 2015-04-25 LAB — PSA: Prostate Specific Ag, Serum: 7.5 ng/mL — ABNORMAL HIGH (ref 0.0–4.0)

## 2015-04-28 ENCOUNTER — Other Ambulatory Visit: Payer: Self-pay | Admitting: Family Medicine

## 2015-04-28 DIAGNOSIS — K219 Gastro-esophageal reflux disease without esophagitis: Secondary | ICD-10-CM

## 2015-05-01 ENCOUNTER — Other Ambulatory Visit: Payer: Self-pay | Admitting: Family Medicine

## 2015-05-01 DIAGNOSIS — I10 Essential (primary) hypertension: Secondary | ICD-10-CM

## 2015-05-01 DIAGNOSIS — E669 Obesity, unspecified: Secondary | ICD-10-CM | POA: Diagnosis not present

## 2015-05-01 DIAGNOSIS — M1990 Unspecified osteoarthritis, unspecified site: Secondary | ICD-10-CM | POA: Diagnosis not present

## 2015-05-01 DIAGNOSIS — M549 Dorsalgia, unspecified: Secondary | ICD-10-CM | POA: Diagnosis not present

## 2015-05-01 DIAGNOSIS — E785 Hyperlipidemia, unspecified: Secondary | ICD-10-CM

## 2015-05-01 DIAGNOSIS — I714 Abdominal aortic aneurysm, without rupture: Secondary | ICD-10-CM | POA: Diagnosis not present

## 2015-05-01 DIAGNOSIS — I2510 Atherosclerotic heart disease of native coronary artery without angina pectoris: Secondary | ICD-10-CM | POA: Diagnosis not present

## 2015-05-01 DIAGNOSIS — J449 Chronic obstructive pulmonary disease, unspecified: Secondary | ICD-10-CM | POA: Diagnosis not present

## 2015-05-06 ENCOUNTER — Other Ambulatory Visit: Payer: Self-pay | Admitting: Vascular Surgery

## 2015-05-06 DIAGNOSIS — R0602 Shortness of breath: Secondary | ICD-10-CM | POA: Diagnosis not present

## 2015-05-06 DIAGNOSIS — I714 Abdominal aortic aneurysm, without rupture: Secondary | ICD-10-CM | POA: Diagnosis not present

## 2015-05-06 DIAGNOSIS — E782 Mixed hyperlipidemia: Secondary | ICD-10-CM | POA: Diagnosis not present

## 2015-05-06 DIAGNOSIS — I10 Essential (primary) hypertension: Secondary | ICD-10-CM | POA: Diagnosis not present

## 2015-05-08 ENCOUNTER — Ambulatory Visit
Admission: RE | Admit: 2015-05-08 | Discharge: 2015-05-08 | Disposition: A | Discharge status: Home / Self Care | Payer: Medicare Other | Source: Ambulatory Visit | Attending: Vascular Surgery | Admitting: Vascular Surgery

## 2015-05-08 ENCOUNTER — Encounter
Admission: RE | Admit: 2015-05-08 | Discharge: 2015-05-08 | Disposition: A | Discharge status: Home / Self Care | Payer: Medicare Other | Source: Ambulatory Visit | Attending: Vascular Surgery | Admitting: Vascular Surgery

## 2015-05-08 DIAGNOSIS — Z01818 Encounter for other preprocedural examination: Secondary | ICD-10-CM | POA: Insufficient documentation

## 2015-05-08 DIAGNOSIS — I517 Cardiomegaly: Secondary | ICD-10-CM | POA: Diagnosis not present

## 2015-05-08 DIAGNOSIS — I519 Heart disease, unspecified: Secondary | ICD-10-CM | POA: Diagnosis not present

## 2015-05-08 DIAGNOSIS — I10 Essential (primary) hypertension: Secondary | ICD-10-CM | POA: Insufficient documentation

## 2015-05-08 DIAGNOSIS — I714 Abdominal aortic aneurysm, without rupture: Secondary | ICD-10-CM | POA: Diagnosis not present

## 2015-05-08 HISTORY — DX: Cerebral infarction, unspecified: I63.9

## 2015-05-08 HISTORY — DX: Dizziness and giddiness: R42

## 2015-05-08 HISTORY — DX: Reserved for inherently not codable concepts without codable children: IMO0001

## 2015-05-08 LAB — APTT: aPTT: 35 seconds (ref 24–36)

## 2015-05-08 LAB — CBC WITH DIFFERENTIAL/PLATELET
Basophils Absolute: 0 10*3/uL (ref 0–0.1)
Basophils Relative: 0 %
Eosinophils Absolute: 0.1 10*3/uL (ref 0–0.7)
Eosinophils Relative: 1 %
HCT: 42.8 % (ref 40.0–52.0)
Hemoglobin: 14.1 g/dL (ref 13.0–18.0)
Lymphocytes Relative: 27 %
Lymphs Abs: 2.1 10*3/uL (ref 1.0–3.6)
MCH: 29 pg (ref 26.0–34.0)
MCHC: 32.9 g/dL (ref 32.0–36.0)
MCV: 88 fL (ref 80.0–100.0)
Monocytes Absolute: 0.9 10*3/uL (ref 0.2–1.0)
Monocytes Relative: 11 %
Neutro Abs: 4.8 10*3/uL (ref 1.4–6.5)
Neutrophils Relative %: 61 %
Platelets: 259 10*3/uL (ref 150–440)
RBC: 4.87 MIL/uL (ref 4.40–5.90)
RDW: 14.6 % — ABNORMAL HIGH (ref 11.5–14.5)
WBC: 7.8 10*3/uL (ref 3.8–10.6)

## 2015-05-08 LAB — TYPE AND SCREEN
ABO/RH(D): O POS
Antibody Screen: NEGATIVE

## 2015-05-08 LAB — SURGICAL PCR SCREEN
MRSA, PCR: NEGATIVE
Staphylococcus aureus: NEGATIVE

## 2015-05-08 LAB — PROTIME-INR
INR: 0.98
Prothrombin Time: 13.2 seconds (ref 11.4–15.0)

## 2015-05-08 LAB — ABO/RH: ABO/RH(D): O POS

## 2015-05-08 NOTE — Patient Instructions (Signed)
  Your procedure is scheduled on: 05/21/15  Report to Day Surgery. To find out your arrival time please call [phone] between 1PM - 3PM on 05/20/15.  Remember: Instructions that are not followed completely may result in serious medical risk, up to and including death, or upon the discretion of your surgeon and anesthesiologist your surgery may need to be rescheduled.    _x___ 1. Do not eat food or drink liquids after midnight. No gum chewing or hard candies.     _x___ 2. No Alcohol for 24 hours before or after surgery.   ____ 3. Bring all medications with you on the day of surgery if instructed.    _x___ 4. Notify your doctor if there is any change in your medical condition     (cold, fever, infections).     Do not wear jewelry, make-up, hairpins, clips or nail polish.  Do not wear lotions, powders, or perfumes. You may wear deodorant.  Do not shave 48 hours prior to surgery. Men may shave face and neck.  Do not bring valuables to the hospital.    [facility] is not responsible for any belongings or valuables.               Contacts, dentures or bridgework may not be worn into surgery.  Leave your suitcase in the car. After surgery it may be brought to your room.  For patients admitted to the hospital, discharge time is determined by your                treatment team.   Patients discharged the day of surgery will not be allowed to drive home.   Please read over the following fact sheets that you were given:   Surgical Site Infection Prevention   ____ Take these medicines the morning of surgery with A SIP OF WATER:    1. ranitidine  2.verapamil  3.   4.  5.  6.  ____ Fleet Enema (as directed)   __x__ Use CHG Soap as directed  __x__ Use inhalers on the day of surgery  ____ Stop metformin 2 days prior to surgery    ____ Take 1/2 of usual insulin dose the night before surgery and none on the morning of surgery.   ____ Stop Coumadin/Plavix/aspirin on   ____ Stop  Anti-inflammatories on   _x___ Stop supplements until after surgery.   stop lovaza and preservision  now  ____ Bring C-Pap to the hospital.

## 2015-05-19 ENCOUNTER — Other Ambulatory Visit: Payer: Self-pay | Admitting: Family Medicine

## 2015-05-19 ENCOUNTER — Other Ambulatory Visit: Payer: Medicare Other

## 2015-05-21 ENCOUNTER — Encounter
Admission: AD | Disposition: A | Discharge status: Home / Self Care | Payer: Self-pay | Source: Ambulatory Visit | Attending: Vascular Surgery

## 2015-05-21 ENCOUNTER — Encounter: Payer: Self-pay | Admitting: *Deleted

## 2015-05-21 ENCOUNTER — Ambulatory Visit: Payer: Medicare Other | Admitting: Certified Registered"

## 2015-05-21 ENCOUNTER — Inpatient Hospital Stay
Admission: AD | Admit: 2015-05-21 | Discharge: 2015-05-22 | DRG: 269 | Disposition: A | Discharge status: Home / Self Care | Payer: Medicare Other | Source: Ambulatory Visit | Attending: Vascular Surgery | Admitting: Vascular Surgery

## 2015-05-21 DIAGNOSIS — J449 Chronic obstructive pulmonary disease, unspecified: Secondary | ICD-10-CM | POA: Diagnosis present

## 2015-05-21 DIAGNOSIS — I119 Hypertensive heart disease without heart failure: Secondary | ICD-10-CM | POA: Diagnosis present

## 2015-05-21 DIAGNOSIS — I714 Abdominal aortic aneurysm, without rupture, unspecified: Secondary | ICD-10-CM | POA: Diagnosis present

## 2015-05-21 DIAGNOSIS — I2510 Atherosclerotic heart disease of native coronary artery without angina pectoris: Secondary | ICD-10-CM | POA: Diagnosis present

## 2015-05-21 DIAGNOSIS — Z8673 Personal history of transient ischemic attack (TIA), and cerebral infarction without residual deficits: Secondary | ICD-10-CM | POA: Diagnosis not present

## 2015-05-21 DIAGNOSIS — Z7982 Long term (current) use of aspirin: Secondary | ICD-10-CM

## 2015-05-21 DIAGNOSIS — I10 Essential (primary) hypertension: Secondary | ICD-10-CM | POA: Diagnosis not present

## 2015-05-21 DIAGNOSIS — I739 Peripheral vascular disease, unspecified: Secondary | ICD-10-CM | POA: Diagnosis not present

## 2015-05-21 DIAGNOSIS — Z87891 Personal history of nicotine dependence: Secondary | ICD-10-CM

## 2015-05-21 DIAGNOSIS — Z6832 Body mass index (BMI) 32.0-32.9, adult: Secondary | ICD-10-CM

## 2015-05-21 HISTORY — PX: PERIPHERAL VASCULAR CATHETERIZATION: SHX172C

## 2015-05-21 LAB — BASIC METABOLIC PANEL
Anion gap: 8 (ref 5–15)
BUN: 20 mg/dL (ref 6–20)
CO2: 28 mmol/L (ref 22–32)
Calcium: 9.6 mg/dL (ref 8.9–10.3)
Chloride: 105 mmol/L (ref 101–111)
Creatinine, Ser: 1.18 mg/dL (ref 0.61–1.24)
GFR calc Af Amer: >60 mL/min (ref >60)
GFR calc non Af Amer: >60 mL/min (ref >60)
Glucose, Bld: 120 mg/dL — ABNORMAL HIGH (ref 65–99)
Potassium: 4.5 mmol/L (ref 3.5–5.1)
Sodium: 141 mmol/L (ref 135–145)

## 2015-05-21 LAB — MRSA PCR SCREENING: MRSA by PCR: NEGATIVE

## 2015-05-21 LAB — GLUCOSE, CAPILLARY: Glucose-Capillary: 167 mg/dL — ABNORMAL HIGH (ref 65–99)

## 2015-05-21 SURGERY — ENDOVASCULAR REPAIR/STENT GRAFT
Anesthesia: General

## 2015-05-21 MED ORDER — VERAPAMIL HCL ER 180 MG PO TBCR
180.0000 mg | EXTENDED_RELEASE_TABLET | Freq: Two times a day (BID) | ORAL | Status: DC
  Administered 2015-05-22: 180 mg via ORAL
  Filled 2015-05-21 (×5): qty 1

## 2015-05-21 MED ORDER — POLYETHYL GLYCOL-PROPYL GLYCOL 0.4-0.3 % OP SOLN: 1.0000 [drp] | Freq: Two times a day (BID) | OPHTHALMIC | Status: DC | PRN

## 2015-05-21 MED ORDER — VANCOMYCIN HCL IN DEXTROSE 1-5 GM/200ML-% IV SOLN
1000.0000 mg | Freq: Two times a day (BID) | INTRAVENOUS | Status: AC
  Administered 2015-05-21 – 2015-05-22 (×2): 1000 mg via INTRAVENOUS
  Filled 2015-05-21 (×2): qty 200

## 2015-05-21 MED ORDER — OXYCODONE-ACETAMINOPHEN 5-325 MG PO TABS: 1.0000 | ORAL_TABLET | ORAL | Status: DC | PRN

## 2015-05-21 MED ORDER — DEXTROSE 5 % IV SOLN
1.5000 g | Freq: Two times a day (BID) | INTRAVENOUS | Status: AC
  Administered 2015-05-21 – 2015-05-22 (×2): 1.5 g via INTRAVENOUS
  Filled 2015-05-21 (×2): qty 1.5

## 2015-05-21 MED ORDER — FAMOTIDINE IN NACL 20-0.9 MG/50ML-% IV SOLN
20.0000 mg | Freq: Two times a day (BID) | INTRAVENOUS | Status: DC
  Administered 2015-05-21 – 2015-05-22 (×2): 20 mg via INTRAVENOUS
  Filled 2015-05-21 (×3): qty 50

## 2015-05-21 MED ORDER — CEFAZOLIN SODIUM-DEXTROSE 2-3 GM-% IV SOLR
2.0000 g | INTRAVENOUS | Status: AC
  Administered 2015-05-21: 2 g via INTRAVENOUS
  Filled 2015-05-21: qty 50

## 2015-05-21 MED ORDER — PROPOFOL 10 MG/ML IV BOLUS
INTRAVENOUS | Status: DC | PRN
  Administered 2015-05-21: 150 mg via INTRAVENOUS
  Administered 2015-05-21: 20 mg via INTRAVENOUS

## 2015-05-21 MED ORDER — EPHEDRINE SULFATE 50 MG/ML IJ SOLN
INTRAMUSCULAR | Status: DC | PRN
  Administered 2015-05-21: 10 mg via INTRAVENOUS
  Administered 2015-05-21: 5 mg via INTRAVENOUS
  Administered 2015-05-21: 10 mg via INTRAVENOUS

## 2015-05-21 MED ORDER — LABETALOL HCL 5 MG/ML IV SOLN: 10.0000 mg | INTRAVENOUS | Status: DC | PRN

## 2015-05-21 MED ORDER — ALUM & MAG HYDROXIDE-SIMETH 200-200-20 MG/5ML PO SUSP: 15.0000 mL | ORAL | Status: DC | PRN

## 2015-05-21 MED ORDER — ACETAMINOPHEN 325 MG PO TABS: 325.0000 mg | ORAL_TABLET | ORAL | Status: DC | PRN

## 2015-05-21 MED ORDER — GUAIFENESIN-DM 100-10 MG/5ML PO SYRP: 15.0000 mL | ORAL_SOLUTION | ORAL | Status: DC | PRN

## 2015-05-21 MED ORDER — ONDANSETRON HCL 4 MG/2ML IJ SOLN: 4.0000 mg | Freq: Once | INTRAMUSCULAR | Status: DC | PRN

## 2015-05-21 MED ORDER — DOPAMINE-DEXTROSE 3.2-5 MG/ML-% IV SOLN: 3.0000 ug/kg/min | INTRAVENOUS | Status: DC

## 2015-05-21 MED ORDER — ROCURONIUM BROMIDE 100 MG/10ML IV SOLN
INTRAVENOUS | Status: DC | PRN
  Administered 2015-05-21: 10 mg via INTRAVENOUS
  Administered 2015-05-21: 40 mg via INTRAVENOUS

## 2015-05-21 MED ORDER — HEPARIN SODIUM (PORCINE) 1000 UNIT/ML IJ SOLN
INTRAMUSCULAR | Status: DC | PRN
  Administered 2015-05-21: 6000 [IU] via INTRAVENOUS

## 2015-05-21 MED ORDER — ACETAMINOPHEN 650 MG RE SUPP: 325.0000 mg | RECTAL | Status: DC | PRN

## 2015-05-21 MED ORDER — SODIUM CHLORIDE 0.9 % IV SOLN
10000.0000 ug | INTRAVENOUS | Status: DC | PRN
  Administered 2015-05-21: 25 ug/min via INTRAVENOUS

## 2015-05-21 MED ORDER — METOPROLOL TARTRATE 1 MG/ML IV SOLN
2.0000 mg | INTRAVENOUS | Status: DC | PRN
Start: 2015-05-21 — End: 2015-05-22

## 2015-05-21 MED ORDER — HYDRALAZINE HCL 20 MG/ML IJ SOLN: 5.0000 mg | INTRAMUSCULAR | Status: DC | PRN

## 2015-05-21 MED ORDER — LACTATED RINGERS IV SOLN
INTRAVENOUS | Status: DC
  Administered 2015-05-21 (×2): via INTRAVENOUS

## 2015-05-21 MED ORDER — POTASSIUM CHLORIDE CRYS ER 20 MEQ PO TBCR: 20.0000 meq | EXTENDED_RELEASE_TABLET | Freq: Every day | ORAL | Status: DC | PRN

## 2015-05-21 MED ORDER — IOHEXOL 300 MG/ML  SOLN
INTRAMUSCULAR | Status: DC | PRN
  Administered 2015-05-21: 60 mL via INTRA_ARTERIAL

## 2015-05-21 MED ORDER — CEFAZOLIN SODIUM-DEXTROSE 2-3 GM-% IV SOLR
2.0000 g | INTRAVENOUS | Status: DC
  Filled 2015-05-21: qty 50

## 2015-05-21 MED ORDER — ASPIRIN EC 81 MG PO TBEC
81.0000 mg | DELAYED_RELEASE_TABLET | Freq: Every day | ORAL | Status: DC
  Administered 2015-05-21 – 2015-05-22 (×2): 81 mg via ORAL
  Filled 2015-05-21 (×2): qty 1

## 2015-05-21 MED ORDER — SUGAMMADEX SODIUM 200 MG/2ML IV SOLN
INTRAVENOUS | Status: DC | PRN
  Administered 2015-05-21: 200 mg via INTRAVENOUS

## 2015-05-21 MED ORDER — LIDOCAINE HCL (CARDIAC) 20 MG/ML IV SOLN
INTRAVENOUS | Status: DC | PRN
  Administered 2015-05-21: 100 mg via INTRAVENOUS

## 2015-05-21 MED ORDER — MIDAZOLAM HCL 2 MG/2ML IJ SOLN
INTRAMUSCULAR | Status: DC | PRN
  Administered 2015-05-21: 2 mg via INTRAVENOUS

## 2015-05-21 MED ORDER — ONDANSETRON HCL 4 MG/2ML IJ SOLN: 4.0000 mg | Freq: Four times a day (QID) | INTRAMUSCULAR | Status: DC | PRN

## 2015-05-21 MED ORDER — PHENYLEPHRINE HCL 10 MG/ML IJ SOLN
INTRAMUSCULAR | Status: DC | PRN
  Administered 2015-05-21: 200 ug via INTRAVENOUS
  Administered 2015-05-21 (×7): 100 ug via INTRAVENOUS

## 2015-05-21 MED ORDER — LATANOPROST 0.005 % OP SOLN
1.0000 [drp] | Freq: Every day | OPHTHALMIC | Status: DC
  Administered 2015-05-21: 1 [drp] via OPHTHALMIC
  Filled 2015-05-21: qty 2.5

## 2015-05-21 MED ORDER — FENTANYL CITRATE (PF) 100 MCG/2ML IJ SOLN
INTRAMUSCULAR | Status: DC | PRN
  Administered 2015-05-21: 100 ug via INTRAVENOUS
  Administered 2015-05-21 (×3): 50 ug via INTRAVENOUS

## 2015-05-21 MED ORDER — MAGNESIUM SULFATE 2 GM/50ML IV SOLN
2.0000 g | Freq: Every day | INTRAVENOUS | Status: DC | PRN
  Filled 2015-05-21: qty 50

## 2015-05-21 MED ORDER — VITAMIN D 1000 UNITS PO TABS
1000.0000 [IU] | ORAL_TABLET | Freq: Two times a day (BID) | ORAL | Status: DC
  Administered 2015-05-21 – 2015-05-22 (×3): 1000 [IU] via ORAL
  Filled 2015-05-21 (×3): qty 1

## 2015-05-21 MED ORDER — DOCUSATE SODIUM 100 MG PO CAPS
100.0000 mg | ORAL_CAPSULE | Freq: Every day | ORAL | Status: DC
  Administered 2015-05-22: 100 mg via ORAL
  Filled 2015-05-21: qty 1

## 2015-05-21 MED ORDER — PHENOL 1.4 % MT LIQD
1.0000 | OROMUCOSAL | Status: DC | PRN
  Filled 2015-05-21: qty 177

## 2015-05-21 MED ORDER — OMEGA-3-ACID ETHYL ESTERS 1 G PO CAPS
1.0000 | ORAL_CAPSULE | Freq: Two times a day (BID) | ORAL | Status: DC
Start: 2015-05-21 — End: 2015-05-22
  Administered 2015-05-21 – 2015-05-22 (×3): 1 g via ORAL
  Filled 2015-05-21 (×3): qty 1

## 2015-05-21 MED ORDER — MORPHINE SULFATE (PF) 2 MG/ML IV SOLN: 2.0000 mg | INTRAVENOUS | Status: DC | PRN

## 2015-05-21 MED ORDER — FENTANYL CITRATE (PF) 100 MCG/2ML IJ SOLN: 25.0000 ug | INTRAMUSCULAR | Status: DC | PRN

## 2015-05-21 MED ORDER — TIOTROPIUM BROMIDE MONOHYDRATE 18 MCG IN CAPS
18.0000 ug | ORAL_CAPSULE | Freq: Every day | RESPIRATORY_TRACT | Status: DC
  Administered 2015-05-21 – 2015-05-22 (×2): 18 ug via RESPIRATORY_TRACT
  Filled 2015-05-21: qty 5

## 2015-05-21 MED ORDER — DEXAMETHASONE SODIUM PHOSPHATE 4 MG/ML IJ SOLN
INTRAMUSCULAR | Status: DC | PRN
  Administered 2015-05-21: 5 mg via INTRAVENOUS

## 2015-05-21 MED ORDER — SODIUM CHLORIDE 0.9 % IV SOLN
INTRAVENOUS | Status: DC
  Administered 2015-05-21 – 2015-05-22 (×2): via INTRAVENOUS

## 2015-05-21 MED ORDER — FAMOTIDINE 20 MG PO TABS: 20.0000 mg | ORAL_TABLET | Freq: Every day | ORAL | Status: DC

## 2015-05-21 MED ORDER — NITROGLYCERIN IN D5W 200-5 MCG/ML-% IV SOLN: 5.0000 ug/min | INTRAVENOUS | Status: DC

## 2015-05-21 MED ORDER — SODIUM CHLORIDE 0.9 % IV SOLN
INTRAVENOUS | Status: DC | PRN
  Administered 2015-05-21: 08:00:00 via INTRAVENOUS

## 2015-05-21 MED ORDER — DOXYCYCLINE HYCLATE 20 MG PO TABS
20.0000 mg | ORAL_TABLET | Freq: Two times a day (BID) | ORAL | Status: DC
  Administered 2015-05-21 – 2015-05-22 (×3): 20 mg via ORAL
  Filled 2015-05-21 (×5): qty 1

## 2015-05-21 MED ORDER — ATORVASTATIN CALCIUM 10 MG PO TABS
10.0000 mg | ORAL_TABLET | Freq: Every day | ORAL | Status: DC
  Administered 2015-05-21 – 2015-05-22 (×2): 10 mg via ORAL
  Filled 2015-05-21 (×2): qty 1

## 2015-05-21 MED ORDER — DESFLURANE IN SOLN
RESPIRATORY_TRACT | Status: DC | PRN
Start: 2015-05-21 — End: 2015-05-21
  Administered 2015-05-21: 3.5 % via RESPIRATORY_TRACT

## 2015-05-21 MED ORDER — ONDANSETRON HCL 4 MG/2ML IJ SOLN
INTRAMUSCULAR | Status: DC | PRN
  Administered 2015-05-21: 4 mg via INTRAVENOUS

## 2015-05-21 SURGICAL SUPPLY — 42 items
BRUSH SCRUB 4% CHG (MISCELLANEOUS) ×3 IMPLANT
CATH ACCU-VU SIZ PIG 5F 70CM (CATHETERS) ×3 IMPLANT
CATH BALLN CODA 9X100X32 (BALLOONS) ×3 IMPLANT
CATH MHK1 5FR 80CM (CATHETERS) ×3 IMPLANT
DEVICE CLOSURE PERCLS PRGLD 6F (VASCULAR PRODUCTS) ×8 IMPLANT
DEVICE TORQUE (MISCELLANEOUS) ×3 IMPLANT
DRYSEAL FLEXSHEATH 12FR 33CM (SHEATH) ×2
DRYSEAL FLEXSHEATH 18FR 33CM (SHEATH) ×2
EXCLUDER TNK LEG 28MX12X16 (Endovascular Graft) ×1 IMPLANT
EXCLUDER TRUNK LEG 28MX12X16 (Endovascular Graft) ×3 IMPLANT
GLIDECATH NONTAPER ANGL 5FR (CATHETERS) ×3 IMPLANT
GLIDEWIRE STIFF .35X180X3 HYDR (WIRE) ×3 IMPLANT
GLOVE SURG SYN 8.0 (GLOVE) ×6 IMPLANT
GOWN STRL REUS W/ TWL LRG LVL3 (GOWN DISPOSABLE) ×1 IMPLANT
GOWN STRL REUS W/ TWL XL LVL3 (GOWN DISPOSABLE) ×1 IMPLANT
GOWN STRL REUS W/TWL LRG LVL3 (GOWN DISPOSABLE) ×2
GOWN STRL REUS W/TWL XL LVL3 (GOWN DISPOSABLE) ×2
GRAFT EXCLUDER LEG 14.5X12 (Endovascular Graft) ×3 IMPLANT
LIQUID BAND (GAUZE/BANDAGES/DRESSINGS) ×3 IMPLANT
LOOP RED MAXI  1X406MM (MISCELLANEOUS) ×2
LOOP VESSEL MAXI 1X406 RED (MISCELLANEOUS) ×1 IMPLANT
LOOP VESSEL MINI 0.8X406 BLUE (MISCELLANEOUS) ×1 IMPLANT
LOOPS BLUE MINI 0.8X406MM (MISCELLANEOUS) ×2
PACK ANGIOGRAPHY (CUSTOM PROCEDURE TRAY) ×3 IMPLANT
PACK BASIN MAJOR ARMC (MISCELLANEOUS) ×3 IMPLANT
PAD GROUND ADULT SPLIT (MISCELLANEOUS) ×3 IMPLANT
PERCLOSE PROGLIDE 6F (VASCULAR PRODUCTS) ×24
SET INTRO CAPELLA COAXIAL (SET/KITS/TRAYS/PACK) ×3 IMPLANT
SHEATH BRITE TIP 6FRX11 (SHEATH) ×6 IMPLANT
SHEATH BRITE TIP 8FRX11 (SHEATH) ×6 IMPLANT
SHEATH DRYSEAL FLEX 12FR 33CM (SHEATH) ×1 IMPLANT
SHEATH DRYSEAL FLEX 18FR 33CM (SHEATH) ×1 IMPLANT
SPONGE XRAY 4X4 16PLY STRL (MISCELLANEOUS) ×15 IMPLANT
SUT MNCRL 4-0 (SUTURE) ×2
SUT MNCRL 4-0 27XMFL (SUTURE) ×1
SUTURE MNCRL 4-0 27XMF (SUTURE) ×1 IMPLANT
SYR 20CC LL (SYRINGE) ×3 IMPLANT
SYR MEDRAD MARK V 150ML (SYRINGE) ×3 IMPLANT
TOWEL OR 17X26 4PK STRL BLUE (TOWEL DISPOSABLE) ×6 IMPLANT
TUBING CONTRAST HIGH PRESS 72 (TUBING) ×3 IMPLANT
WIRE AMPLATZ SSTIFF .035X260CM (WIRE) ×6 IMPLANT
WIRE J 3MM .035X145CM (WIRE) ×9 IMPLANT

## 2015-05-21 NOTE — Op Note (Signed)
    OPERATIVE NOTE   PROCEDURE: 1. US guidance for vascular access, bilateral femoral arteries 2. Catheter placement into aorta from bilateral femoral approaches 3. Placement of a C3 Gore Excluder Endoprosthesis main body [id number] with a 14x12 contralateral limb 4. ProGlide closure devices bilateral femoral arteries  PRE-OPERATIVE DIAGNOSIS: AAA  POST-OPERATIVE DIAGNOSIS: same  SURGEON: Hortencia Pilar, MD and Leotis Pain, MD - Co-surgeons  ANESTHESIA: general  ESTIMATED BLOOD LOSS: 125 cc  FINDING(S): 1.  AAA  SPECIMEN(S):  none  INDICATIONS:   Howard Anderson is a 69 y.o. male who presents with an AAA that is now >5 cm.  He has elected to undergo endovascular repair to prevent lethal rupture  DESCRIPTION: After obtaining full informed written consent, the patient was brought back to the operating room and placed supine upon the operating table.  The patient received IV antibiotics prior to induction.  After obtaining adequate anesthesia, the patient was prepped and draped in the standard fashion for endovascular AAA repair.  We then began by gaining access to both femoral arteries with US guidance with me working on the right and Dr. Lucky Cowboy working on the left.  The femoral arteries were found to be patent and accessed without difficulty with a needle under ultrasound guidance without difficulty on each side and permanent images were recorded.  We then placed 2 proglide devices on each side in a pre-close fashion and placed 8 French sheaths. The patient was then given  6000 units of intravenous heparin. The Pigtail catheter was placed into the aorta from the  Right side. Using this image, we selected a 28x12x16 Main body device.  Over a stiff wire, an 38 French sheath was placed on the right. The main body was then placed through the 18 French sheath. A Kumpe catheter was placed up the left side and a magnified image at the renal arteries was performed. The main body was then deployed  just below the lowest renal artery. The Kumpe catheter was used to cannulate the contralateral gate without difficulty and successful cannulation was confirmed by twirling the pigtail catheter in the main body. We then placed a stiff wire and a retrograde arteriogram was performed through the  Left femoral sheath. We upsized to the 12 French sheath for the contralateral limb and a 14x12 limb was selected and deployed. The main body deployment was then completed. Based off the angiographic findings, extension limbs were not necessary.  All junction points and seals zones were treated with the compliant balloon. The pigtail catheter was then replaced and a completion angiogram was performed.   A small type two endoleak was detected on completion angiography. The renal arteries were found to be widely patent.  At this point we elected to terminate the procedure. We secured the pro glide devices for hemostasis on the femoral arteries. The skin incision was closed with a 4-0 Monocryl. Dermabond and pressure dressing were placed. The patient was taken to the recovery room in stable condition having tolerated the procedure well.  COMPLICATIONS: none  CONDITION: stable  [name]  12/07/2014, 3:51 PM

## 2015-05-21 NOTE — Transfer of Care (Signed)
Immediate Anesthesia Transfer of Care Note  Patient: Howard Anderson  Procedure(s) Performed: Procedure(s): Endovascular Repair/Stent Graft (N/A)  Patient Location: PACU  Anesthesia Type:General  Level of Consciousness: awake  Airway & Oxygen Therapy: Patient Spontanous Breathing and Patient connected to face mask oxygen  Post-op Assessment: Report given to RN  Post vital signs: Reviewed  Last Vitals:  Filed Vitals:   05/21/15 1042  BP: 112/74  Pulse: 88  Temp: 36.6 C  Resp: 17    Complications: No apparent anesthesia complications

## 2015-05-21 NOTE — H&P (Signed)
[facility] History & Physical Update  The patient was interviewed and re-examined.  The patient's previous History and Physical has been reviewed and is unchanged.  There is no change in the plan of care. We plan to proceed with the scheduled procedure.  Mizraim Harmening, [name], MD  05/21/2015, 8:21 AM

## 2015-05-21 NOTE — Progress Notes (Addendum)
MD/N of pt with level 2 hematoma to L groin, pt without orders for PAD communicated, pt VSS, pt resting and calm, nursing will cont to monitor  1654 MD at [facility], at approx 1600 1L NS bags placed on bilateral groins, Hematoma level2/3 L groin and 1 Right groin, pt to stay on BR until the am, pt VSS, pt without any complaints, verbal order given by MD/ Schnier to leave PAD in place until am, nursing will cont to monitor

## 2015-05-21 NOTE — Progress Notes (Signed)
Per Dr. [name] apply 1L NS over each femoral area and do not deflate PAD.

## 2015-05-21 NOTE — Anesthesia Procedure Notes (Signed)
Procedure Name: Intubation Performed by: Rolla Plate Pre-anesthesia Checklist: Patient identified, Patient being monitored, Timeout performed, Emergency Drugs available and Suction available Patient Re-evaluated:Patient Re-evaluated prior to inductionOxygen Delivery Method: Circle system utilized Preoxygenation: Pre-oxygenation with 100% oxygen Intubation Type: IV induction Ventilation: Mask ventilation without difficulty and Oral airway inserted - appropriate to patient size Laryngoscope Size: Miller and 2 Grade View: Grade I Tube type: Oral Tube size: 7.5 mm Number of attempts: 1 Airway Equipment and Method: Stylet Placement Confirmation: ETT inserted through vocal cords under direct vision,  positive ETCO2 and breath sounds checked- equal and bilateral Secured at: 22 cm Tube secured with: Tape Dental Injury: Teeth and Oropharynx as per pre-operative assessment

## 2015-05-21 NOTE — Op Note (Signed)
    OPERATIVE NOTE   PROCEDURE: 1. US guidance for vascular access, bilateral femoral arteries 2. Catheter placement into aorta from bilateral femoral approaches 3. Placement of a 28 mm diameter 16 cm length Gore Excluder Endoprosthesis main body right with a 14 mm diameter x 12 cm length contralateral limb 4. ProGlide closure devices bilateral femoral arteries  PRE-OPERATIVE DIAGNOSIS: AAA  POST-OPERATIVE DIAGNOSIS: same  SURGEON: Leotis Pain, MD and Hortencia Pilar, MD - Co-surgeons  ANESTHESIA: general  ESTIMATED BLOOD LOSS: 100 cc  FINDING(S): 1.  AAA  SPECIMEN(S):  none  INDICATIONS:   ODAI WIMMER is a 69 y.o. male who presents with a >5 cm AAA.  He has adequate anatomy for endovascular repair.  Risks and benefits were discussed and informed consent was obtained.  DESCRIPTION: After obtaining full informed written consent, the patient was brought back to the operating room and placed supine upon the operating table.  The patient received IV antibiotics prior to induction.  After obtaining adequate anesthesia, the patient was prepped and draped in the standard fashion for endovascular AAA repair.  We then began by gaining access to both femoral arteries with US guidance with me working on the left and Dr. [name] working on the right.  The femoral arteries were found to be patent and accessed without difficulty with a needle under ultrasound guidance without difficulty on each side and permanent images were recorded.  We then placed 2 proglide devices on each side in a pre-close fashion and placed 8 French sheaths. The patient was then given 6000 units of intravenous heparin. The Pigtail catheter was placed into the aorta from the right side. Using this image, we selected a 28 mm diameter, 16 cm length Main body device.  Over a stiff wire, an 27 French sheath was placed up the right side. The main body was then placed through the 18 French sheath. A Kumpe catheter was placed up the  left side and a magnified image at the renal arteries was performed. The main body was then deployed just below the lowest renal artery. The Kumpe catheter was used to cannulate the contralateral gate without difficulty and successful cannulation was confirmed by twirling the pigtail catheter in the main body. We then placed a stiff wire and a retrograde arteriogram was performed through the left femoral sheath. We upsized to the 12 [place] sheath for the contralateral limb and a 14 mm diameter 12 cm length limb was selected and deployed. The main body deployment was then completed. Based off the angiographic findings, extension limbs were not necessary. All junction points and seals zones were treated with the compliant balloon. The pigtail catheter was then replaced and a completion angiogram was performed.  A small type II Endoleak was detected on completion angiography. The renal arteries were found to be widely patent. The hypogastric arteries were patent. At this point we elected to terminate the procedure. We secured the pro glide devices for hemostasis on the femoral arteries. The skin incision was closed with a 4-0 Monocryl. Dermabond and pressure dressing were placed. The patient was taken to the recovery room in stable condition having tolerated the procedure well.  COMPLICATIONS: none  CONDITION: stable  Susie Pousson  05/21/2015, 10:23 AM

## 2015-05-21 NOTE — Progress Notes (Signed)
[facility]  Daily Progress Note   Subjective  - Day of Surgery; night of surgery  Denies pain specifically no left or right groin pain  Objective Filed Vitals:   05/21/15 1213 05/21/15 1300 05/21/15 1400 05/21/15 1500  BP:  108/84 112/101 105/71  Pulse:  71 73 76  Temp: 98.1 F (36.7 C) 98 F (36.7 C) 98.1 F (36.7 C) 98 F (36.7 C)  TempSrc: Oral Oral Oral Oral  Resp:  16 18 20   Height:      Weight:      SpO2:  96% 98% 97%    Intake/Output Summary (Last 24 hours) at 05/21/15 1805 Last data filed at 05/21/15 1500  Gross per 24 hour  Intake   1870 ml  Output    405 ml  Net   1465 ml    PULM  Normal effort , no use of accessory muscles CV  No JVD, RRR Abd      No distended, nontender VASC  hematoma left groin feet pink and warm palpable DP pulses  Laboratory CBC    Component Value Date/Time   WBC 7.8 05/08/2015 1252   WBC 7.9 08/01/2014 0606   HGB 14.1 05/08/2015 1252   HGB 12.7* 08/01/2014 0606   HCT 42.8 05/08/2015 1252   HCT 38.0* 08/01/2014 0606   PLT 259 05/08/2015 1252   PLT 210 08/01/2014 0606    BMET    Component Value Date/Time   NA 141 05/21/2015 0704   NA 139 08/02/2014 1128   K 4.5 05/21/2015 0704   K 4.2 08/02/2014 1128   CL 105 05/21/2015 0704   CL 106 08/02/2014 1128   CO2 28 05/21/2015 0704   CO2 25 08/02/2014 1128   GLUCOSE 120* 05/21/2015 0704   GLUCOSE 175* 08/02/2014 1128   BUN 20 05/21/2015 0704   BUN 16 08/02/2014 1128   CREATININE 1.18 05/21/2015 0704   CREATININE 0.74 08/03/2014 0830   CALCIUM 9.6 05/21/2015 0704   CALCIUM 7.9* 08/02/2014 1128   GFRNONAA >60 05/21/2015 0704   GFRNONAA >60 08/03/2014 0830   GFRNONAA >60 10/18/2013 0502   GFRAA >60 05/21/2015 0704   GFRAA >60 08/03/2014 0830   GFRAA >60 10/18/2013 0502    Assessment/Planning: POD #night of surgery s/p endovascular AAA   Patient is doing well vital signs are stable hemodynamics are all good will continue to follow the left  groin but no need for immediate intervention at this time we'll hope for discharge tomorrow as is typical    [name]  05/21/2015, 6:05 PM

## 2015-05-21 NOTE — Anesthesia Preprocedure Evaluation (Addendum)
Anesthesia Evaluation  Patient identified by MRN, date of birth, ID band Patient awake    Reviewed: Allergy & Precautions, NPO status , Patient's Chart, lab work & pertinent test results, reviewed documented beta blocker date and time   Airway Mallampati: II  TM Distance: >3 FB     Dental  (+) Chipped   Pulmonary shortness of breath, COPD, former smoker,           Cardiovascular hypertension, Pt. on medications + Peripheral Vascular Disease       Neuro/Psych CVA, No Residual Symptoms    GI/Hepatic   Endo/Other    Renal/GU      Musculoskeletal  (+) Arthritis ,   Abdominal   Peds  Hematology   Anesthesia Other Findings Pt probably had a TIA. No residuals.  Reproductive/Obstetrics                            Anesthesia Physical Anesthesia Plan  ASA: III  Anesthesia Plan: General   Post-op Pain Management:    Induction: Intravenous  Airway Management Planned: Oral ETT  Additional Equipment:   Intra-op Plan:   Post-operative Plan:   Informed Consent: I have reviewed the patients History and Physical, chart, labs and discussed the procedure including the risks, benefits and alternatives for the proposed anesthesia with the patient or authorized representative who has indicated his/her understanding and acceptance.     Plan Discussed with: CRNA  Anesthesia Plan Comments:         Anesthesia Quick Evaluation

## 2015-05-22 LAB — BASIC METABOLIC PANEL
Anion gap: 5 (ref 5–15)
BUN: 15 mg/dL (ref 6–20)
CO2: 27 mmol/L (ref 22–32)
Calcium: 8.5 mg/dL — ABNORMAL LOW (ref 8.9–10.3)
Chloride: 107 mmol/L (ref 101–111)
Creatinine, Ser: 0.82 mg/dL (ref 0.61–1.24)
GFR calc Af Amer: >60 mL/min (ref >60)
GFR calc non Af Amer: >60 mL/min (ref >60)
Glucose, Bld: 136 mg/dL — ABNORMAL HIGH (ref 65–99)
Potassium: 4.8 mmol/L (ref 3.5–5.1)
Sodium: 139 mmol/L (ref 135–145)

## 2015-05-22 LAB — CBC
HCT: 32.3 % — ABNORMAL LOW (ref 40.0–52.0)
Hemoglobin: 10.8 g/dL — ABNORMAL LOW (ref 13.0–18.0)
MCH: 29.2 pg (ref 26.0–34.0)
MCHC: 33.3 g/dL (ref 32.0–36.0)
MCV: 87.7 fL (ref 80.0–100.0)
Platelets: 201 10*3/uL (ref 150–440)
RBC: 3.68 MIL/uL — ABNORMAL LOW (ref 4.40–5.90)
RDW: 14.2 % (ref 11.5–14.5)
WBC: 10.5 10*3/uL (ref 3.8–10.6)

## 2015-05-22 LAB — GLUCOSE, CAPILLARY: Glucose-Capillary: 102 mg/dL — ABNORMAL HIGH (ref 65–99)

## 2015-05-22 NOTE — Progress Notes (Addendum)
Pt up in chair, PAD off per MD, PAD d/c following protocol of [facility] PAD protocol draft, pt tol well, Pt vss, Pt to eat lunch and plan to d/c home

## 2015-05-22 NOTE — Discharge Summary (Signed)
Physician Discharge Summary  Patient ID: Howard Anderson MRN: [id number] DOB/AGE: Dec 14, 1945 69 y.o.  Admit date: 05/21/2015 Discharge date: 05/22/2015  Admission Diagnoses:Abdominal aortic aneurysm Discharge Diagnoses:  Active Problems:   AAA (abdominal aortic aneurysm) [facility])   Discharged Condition: Good  Hospital Course: Pt had endovascular repair of AAA with Gore C3 Excluder device.  Did well post op.  Hematoma of left groin noted  OK for DC post op day one Consults:  None  Significant Diagnostic Studies: none Treatments: none  Discharge Exam: Blood pressure 106/73, pulse 89, temperature 98.1 F (36.7 C), temperature source Oral, resp. rate 13, height 5\' 7"  (1.702 m), weight 92.534 kg (204 lb), SpO2 99 %. Abd soft nontender Right groin CD&I  Left Groin with ecchymosis soft no bruit Feet pink warm well perfused 1+ DP bilaterally  Disposition:   Discharge Instructions    Call MD for:  redness, tenderness, or signs of infection (pain, swelling, bleeding, redness, odor or green/yellow discharge around incision site)    Complete by:  As directed      Call MD for:  severe or increased pain, loss or decreased feeling  in affected limb(s)    Complete by:  As directed      Call MD for:  temperature >100.5    Complete by:  As directed      Discharge instructions    Complete by:  As directed   OK to ride in a car  OK to shower Elevate legs frequently to minimize swelling     Driving Restrictions    Complete by:  As directed   No driving for 1 weeks     Lifting restrictions    Complete by:  As directed   No lifting > 10 lbs for 2 weeks     Resume previous diet    Complete by:  As directed      may wash over wound with mild soap and water    Complete by:  As directed   There is Dermabond covering and protecting the puncture sites Bruising is expected and will track into the thigh and scrotum with time            Medication List    TAKE these medications        aspirin 81 MG tablet  Take 81 mg by mouth daily.     atorvastatin 10 MG tablet  Commonly known as:  LIPITOR  TAKE 1 TABLET BY MOUTH EVERY DAY     doxycycline 20 MG tablet  Commonly known as:  PERIOSTAT  Take 20 mg by mouth 2 (two) times daily.     Fluticasone-Salmeterol 250-50 MCG/DOSE Aepb  Commonly known as:  ADVAIR  Inhale 1 puff into the lungs 2 (two) times daily.     latanoprost 0.005 % ophthalmic solution  Commonly known as:  XALATAN  1 drop at bedtime.     omega-3 acid ethyl esters 1 G capsule  Commonly known as:  LOVAZA  TAKE ONE CAPSULE BY MOUTH TWICE A DAY     PRESERVISION AREDS PO  Take 1 tablet by mouth.     ranitidine 150 MG tablet  Commonly known as:  ZANTAC  TAKE 1/2 TABLET EVERY DAY     SYSTANE ULTRA PF 0.4-0.3 % Soln  Generic drug:  Polyethyl Glycol-Propyl Glycol  Apply 1 drop to eye 2 (two) times daily. prn     tiotropium 18 MCG inhalation capsule  Commonly known as:  SPIRIVA  Place 18 mcg  into inhaler and inhale daily.     verapamil 180 MG 24 hr capsule  Commonly known as:  VERELAN PM  Take 180 mg by mouth 2 (two) times daily.     Vitamin D3 1000 UNITS Caps  Take 1 capsule by mouth 2 (two) times daily.      ASK your doctor about these medications        traMADol 50 MG tablet  Commonly known as:  ULTRAM  Take 1 tablet (50 mg total) by mouth every 8 (eight) hours as needed.           Follow-up Information    Follow up with Howard Anderson, [name], MD In 2 weeks.   Specialties:  Vascular Surgery, Cardiology, Radiology, Vascular Surgery   Why:  follow up after procedure, OK to see Sherren Mocha information:   [address] [place] [address] [phone]       Signed: [name] 05/22/2015, 11:45 AM

## 2015-05-22 NOTE — Progress Notes (Signed)
Pt d/c instructions given, pt VSS, pt up in chair, tol well, pt groins sites without change from am assessment and stable, pt verbalized understanding of d/c, all questions answered

## 2015-05-22 NOTE — Anesthesia Postprocedure Evaluation (Signed)
  Anesthesia Post-op Note  Patient: Howard Anderson  Procedure(s) Performed: Procedure(s): Endovascular Repair/Stent Graft (N/A)  Anesthesia type:General  Patient location: IC13  Post pain: Pain level controlled  Post assessment: Post-op Vital signs reviewed, Patient's Cardiovascular Status Stable, Respiratory Function Stable, Patent Airway and No signs of Nausea or vomiting  Post vital signs: Reviewed and stable  Last Vitals:  Filed Vitals:   05/22/15 0300  BP: 99/65  Pulse: 86  Temp:   Resp: 20    Level of consciousness: awake, alert  and patient cooperative  Complications: No apparent anesthesia complications

## 2015-05-26 DIAGNOSIS — R972 Elevated prostate specific antigen [PSA]: Secondary | ICD-10-CM | POA: Diagnosis not present

## 2015-05-26 DIAGNOSIS — N401 Enlarged prostate with lower urinary tract symptoms: Secondary | ICD-10-CM | POA: Diagnosis not present

## 2015-05-26 DIAGNOSIS — N390 Urinary tract infection, site not specified: Secondary | ICD-10-CM | POA: Diagnosis not present

## 2015-05-26 DIAGNOSIS — R339 Retention of urine, unspecified: Secondary | ICD-10-CM | POA: Diagnosis not present

## 2015-06-05 DIAGNOSIS — E785 Hyperlipidemia, unspecified: Secondary | ICD-10-CM | POA: Diagnosis not present

## 2015-06-05 DIAGNOSIS — I714 Abdominal aortic aneurysm, without rupture: Secondary | ICD-10-CM | POA: Diagnosis not present

## 2015-06-05 DIAGNOSIS — I10 Essential (primary) hypertension: Secondary | ICD-10-CM | POA: Diagnosis not present

## 2015-06-05 DIAGNOSIS — R609 Edema, unspecified: Secondary | ICD-10-CM | POA: Diagnosis not present

## 2015-06-06 DIAGNOSIS — R609 Edema, unspecified: Secondary | ICD-10-CM | POA: Diagnosis not present

## 2015-06-06 DIAGNOSIS — M7989 Other specified soft tissue disorders: Secondary | ICD-10-CM | POA: Diagnosis not present

## 2015-06-06 DIAGNOSIS — I10 Essential (primary) hypertension: Secondary | ICD-10-CM | POA: Diagnosis not present

## 2015-06-06 DIAGNOSIS — I714 Abdominal aortic aneurysm, without rupture: Secondary | ICD-10-CM | POA: Diagnosis not present

## 2015-06-06 DIAGNOSIS — E785 Hyperlipidemia, unspecified: Secondary | ICD-10-CM | POA: Diagnosis not present

## 2015-06-16 ENCOUNTER — Other Ambulatory Visit: Payer: Self-pay

## 2015-06-16 DIAGNOSIS — J0110 Acute frontal sinusitis, unspecified: Secondary | ICD-10-CM

## 2015-06-16 MED ORDER — AMOXICILLIN 500 MG PO CAPS: 500.0000 mg | ORAL_CAPSULE | Freq: Three times a day (TID) | ORAL | Status: DC

## 2015-06-18 ENCOUNTER — Other Ambulatory Visit: Payer: Self-pay | Admitting: Vascular Surgery

## 2015-06-18 DIAGNOSIS — I724 Aneurysm of artery of lower extremity: Secondary | ICD-10-CM | POA: Diagnosis not present

## 2015-06-18 DIAGNOSIS — I714 Abdominal aortic aneurysm, without rupture: Secondary | ICD-10-CM | POA: Diagnosis not present

## 2015-06-18 DIAGNOSIS — M79609 Pain in unspecified limb: Secondary | ICD-10-CM | POA: Diagnosis not present

## 2015-06-18 DIAGNOSIS — M7989 Other specified soft tissue disorders: Secondary | ICD-10-CM | POA: Diagnosis not present

## 2015-06-18 DIAGNOSIS — E785 Hyperlipidemia, unspecified: Secondary | ICD-10-CM | POA: Diagnosis not present

## 2015-06-18 DIAGNOSIS — I10 Essential (primary) hypertension: Secondary | ICD-10-CM | POA: Diagnosis not present

## 2015-06-18 DIAGNOSIS — R609 Edema, unspecified: Secondary | ICD-10-CM | POA: Diagnosis not present

## 2015-06-19 ENCOUNTER — Other Ambulatory Visit
Admission: RE | Admit: 2015-06-19 | Discharge: 2015-06-19 | Disposition: A | Discharge status: Home / Self Care | Payer: Medicare Other | Source: Ambulatory Visit | Attending: Vascular Surgery | Admitting: Vascular Surgery

## 2015-06-19 DIAGNOSIS — Z029 Encounter for administrative examinations, unspecified: Secondary | ICD-10-CM | POA: Diagnosis present

## 2015-06-19 LAB — CREATININE, SERUM
Creatinine, Ser: 0.81 mg/dL (ref 0.61–1.24)
GFR calc Af Amer: >60 mL/min (ref >60)
GFR calc non Af Amer: >60 mL/min (ref >60)

## 2015-06-19 LAB — BUN: BUN: 19 mg/dL (ref 6–20)

## 2015-06-20 ENCOUNTER — Ambulatory Visit: Payer: Medicare Other | Admitting: Anesthesiology

## 2015-06-20 ENCOUNTER — Encounter
Admission: AD | Disposition: A | Discharge status: Home Health | Payer: Self-pay | Source: Ambulatory Visit | Attending: Vascular Surgery

## 2015-06-20 ENCOUNTER — Inpatient Hospital Stay
Admission: AD | Admit: 2015-06-20 | Discharge: 2015-06-25 | DRG: 253 | Disposition: A | Discharge status: Home Health | Payer: Medicare Other | Source: Ambulatory Visit | Attending: Vascular Surgery | Admitting: Vascular Surgery

## 2015-06-20 ENCOUNTER — Encounter
Admission: AD | Disposition: A | Discharge status: Home Health | Payer: Medicare Other | Source: Ambulatory Visit | Attending: Vascular Surgery

## 2015-06-20 ENCOUNTER — Encounter: Payer: Self-pay | Admitting: *Deleted

## 2015-06-20 DIAGNOSIS — I742 Embolism and thrombosis of arteries of the upper extremities: Secondary | ICD-10-CM | POA: Diagnosis not present

## 2015-06-20 DIAGNOSIS — I10 Essential (primary) hypertension: Secondary | ICD-10-CM | POA: Diagnosis present

## 2015-06-20 DIAGNOSIS — J449 Chronic obstructive pulmonary disease, unspecified: Secondary | ICD-10-CM | POA: Diagnosis present

## 2015-06-20 DIAGNOSIS — I9789 Other postprocedural complications and disorders of the circulatory system, not elsewhere classified: Secondary | ICD-10-CM | POA: Diagnosis not present

## 2015-06-20 DIAGNOSIS — I714 Abdominal aortic aneurysm, without rupture: Secondary | ICD-10-CM | POA: Diagnosis present

## 2015-06-20 DIAGNOSIS — Y838 Other surgical procedures as the cause of abnormal reaction of the patient, or of later complication, without mention of misadventure at the time of the procedure: Secondary | ICD-10-CM | POA: Diagnosis not present

## 2015-06-20 DIAGNOSIS — I679 Cerebrovascular disease, unspecified: Secondary | ICD-10-CM | POA: Diagnosis present

## 2015-06-20 DIAGNOSIS — K219 Gastro-esophageal reflux disease without esophagitis: Secondary | ICD-10-CM | POA: Diagnosis present

## 2015-06-20 DIAGNOSIS — I724 Aneurysm of artery of lower extremity: Secondary | ICD-10-CM | POA: Diagnosis not present

## 2015-06-20 DIAGNOSIS — Z8673 Personal history of transient ischemic attack (TIA), and cerebral infarction without residual deficits: Secondary | ICD-10-CM

## 2015-06-20 DIAGNOSIS — I728 Aneurysm of other specified arteries: Secondary | ICD-10-CM | POA: Diagnosis not present

## 2015-06-20 DIAGNOSIS — I743 Embolism and thrombosis of arteries of the lower extremities: Secondary | ICD-10-CM | POA: Diagnosis not present

## 2015-06-20 DIAGNOSIS — Z6831 Body mass index (BMI) 31.0-31.9, adult: Secondary | ICD-10-CM

## 2015-06-20 DIAGNOSIS — E669 Obesity, unspecified: Secondary | ICD-10-CM | POA: Diagnosis present

## 2015-06-20 HISTORY — PX: PERIPHERAL VASCULAR CATHETERIZATION: SHX172C

## 2015-06-20 HISTORY — PX: ENDARTERECTOMY FEMORAL: SHX5804

## 2015-06-20 HISTORY — DX: Gastro-esophageal reflux disease without esophagitis: K21.9

## 2015-06-20 HISTORY — PX: EMBOLECTOMY: SHX44

## 2015-06-20 LAB — CBC
HCT: 32.3 % — ABNORMAL LOW (ref 40.0–52.0)
Hemoglobin: 10.5 g/dL — ABNORMAL LOW (ref 13.0–18.0)
MCH: 28.9 pg (ref 26.0–34.0)
MCHC: 32.6 g/dL (ref 32.0–36.0)
MCV: 88.7 fL (ref 80.0–100.0)
Platelets: 257 10*3/uL (ref 150–440)
RBC: 3.64 MIL/uL — ABNORMAL LOW (ref 4.40–5.90)
RDW: 15.9 % — ABNORMAL HIGH (ref 11.5–14.5)
WBC: 5.5 10*3/uL (ref 3.8–10.6)

## 2015-06-20 LAB — PREPARE RBC (CROSSMATCH)

## 2015-06-20 SURGERY — EMBOLECTOMY
Anesthesia: Choice | Site: Leg Upper | Laterality: Left | Wound class: Clean

## 2015-06-20 SURGERY — LOWER EXTREMITY ANGIOGRAPHY
Anesthesia: General | Wound class: Clean

## 2015-06-20 MED ORDER — DEXTROSE 5 % IV SOLN
1.5000 g | Freq: Two times a day (BID) | INTRAVENOUS | Status: AC
  Administered 2015-06-21 (×2): 1.5 g via INTRAVENOUS
  Filled 2015-06-20 (×2): qty 1.5

## 2015-06-20 MED ORDER — HEPARIN SODIUM (PORCINE) 1000 UNIT/ML IJ SOLN
INTRAMUSCULAR | Status: AC
  Filled 2015-06-20: qty 1

## 2015-06-20 MED ORDER — LIDOCAINE HCL (CARDIAC) 20 MG/ML IV SOLN
INTRAVENOUS | Status: DC | PRN
  Administered 2015-06-20: 100 mg via INTRAVENOUS

## 2015-06-20 MED ORDER — DOCUSATE SODIUM 100 MG PO CAPS
100.0000 mg | ORAL_CAPSULE | Freq: Every day | ORAL | Status: DC
  Administered 2015-06-21 – 2015-06-25 (×5): 100 mg via ORAL
  Filled 2015-06-20 (×5): qty 1

## 2015-06-20 MED ORDER — GUAIFENESIN-DM 100-10 MG/5ML PO SYRP
15.0000 mL | ORAL_SOLUTION | ORAL | Status: DC | PRN
Start: 2015-06-20 — End: 2015-06-25

## 2015-06-20 MED ORDER — OXYCODONE HCL 5 MG PO TABS: 5.0000 mg | ORAL_TABLET | ORAL | Status: DC | PRN

## 2015-06-20 MED ORDER — LABETALOL HCL 5 MG/ML IV SOLN
10.0000 mg | INTRAVENOUS | Status: DC | PRN
  Filled 2015-06-20: qty 4

## 2015-06-20 MED ORDER — FENTANYL CITRATE (PF) 100 MCG/2ML IJ SOLN
INTRAMUSCULAR | Status: DC | PRN
  Administered 2015-06-20: 50 ug via INTRAVENOUS

## 2015-06-20 MED ORDER — MAGNESIUM CITRATE PO SOLN: 1.0000 | Freq: Once | ORAL | Status: DC | PRN

## 2015-06-20 MED ORDER — IOHEXOL 300 MG/ML  SOLN
INTRAMUSCULAR | Status: DC | PRN
Start: 2015-06-20 — End: 2015-06-20
  Administered 2015-06-20: 20 mL via INTRA_ARTERIAL

## 2015-06-20 MED ORDER — DEXAMETHASONE SODIUM PHOSPHATE 4 MG/ML IJ SOLN
INTRAMUSCULAR | Status: DC | PRN
  Administered 2015-06-20: 5 mg via INTRAVENOUS

## 2015-06-20 MED ORDER — LIDOCAINE HCL (PF) 1 % IJ SOLN
INTRAMUSCULAR | Status: AC
  Filled 2015-06-20: qty 10

## 2015-06-20 MED ORDER — VITAMIN D 1000 UNITS PO TABS
1000.0000 [IU] | ORAL_TABLET | Freq: Two times a day (BID) | ORAL | Status: DC
  Administered 2015-06-21 – 2015-06-25 (×9): 1000 [IU] via ORAL
  Filled 2015-06-20 (×11): qty 1

## 2015-06-20 MED ORDER — FENTANYL CITRATE (PF) 100 MCG/2ML IJ SOLN: 25.0000 ug | INTRAMUSCULAR | Status: DC | PRN

## 2015-06-20 MED ORDER — VERAPAMIL HCL ER 180 MG PO TBCR
180.0000 mg | EXTENDED_RELEASE_TABLET | Freq: Two times a day (BID) | ORAL | Status: DC
  Administered 2015-06-21 – 2015-06-25 (×5): 180 mg via ORAL
  Filled 2015-06-20 (×10): qty 1

## 2015-06-20 MED ORDER — METOPROLOL TARTRATE 1 MG/ML IV SOLN: 2.0000 mg | INTRAVENOUS | Status: DC | PRN

## 2015-06-20 MED ORDER — ACETAMINOPHEN 325 MG PO TABS: 325.0000 mg | ORAL_TABLET | ORAL | Status: DC | PRN

## 2015-06-20 MED ORDER — MAGNESIUM SULFATE 2 GM/50ML IV SOLN: 2.0000 g | Freq: Every day | INTRAVENOUS | Status: DC | PRN

## 2015-06-20 MED ORDER — ALUM & MAG HYDROXIDE-SIMETH 200-200-20 MG/5ML PO SUSP: 15.0000 mL | ORAL | Status: DC | PRN

## 2015-06-20 MED ORDER — MIDAZOLAM HCL 2 MG/2ML IJ SOLN
INTRAMUSCULAR | Status: DC | PRN
  Administered 2015-06-20 (×3): 1 mg via INTRAVENOUS

## 2015-06-20 MED ORDER — GLYCOPYRROLATE 0.2 MG/ML IJ SOLN
INTRAMUSCULAR | Status: DC | PRN
  Administered 2015-06-20: 0.2 mg via INTRAVENOUS

## 2015-06-20 MED ORDER — HEPARIN SODIUM (PORCINE) 1000 UNIT/ML IJ SOLN
INTRAMUSCULAR | Status: DC | PRN
  Administered 2015-06-20: 2000 [IU] via INTRAVENOUS
  Administered 2015-06-20: 3000 [IU] via INTRAVENOUS

## 2015-06-20 MED ORDER — SODIUM CHLORIDE 0.9 % IJ SOLN
INTRAMUSCULAR | Status: AC
  Filled 2015-06-20: qty 50

## 2015-06-20 MED ORDER — ACETAMINOPHEN 325 MG RE SUPP: 325.0000 mg | RECTAL | Status: DC | PRN

## 2015-06-20 MED ORDER — FENTANYL CITRATE (PF) 100 MCG/2ML IJ SOLN
INTRAMUSCULAR | Status: AC
  Filled 2015-06-20: qty 2

## 2015-06-20 MED ORDER — SODIUM CHLORIDE 0.9 % IV SOLN
INTRAVENOUS | Status: DC | PRN
  Administered 2015-06-20 (×3): via INTRAVENOUS

## 2015-06-20 MED ORDER — HEPARIN (PORCINE) IN NACL 2-0.9 UNIT/ML-% IJ SOLN
INTRAMUSCULAR | Status: AC
  Filled 2015-06-20: qty 1000

## 2015-06-20 MED ORDER — FENTANYL CITRATE (PF) 100 MCG/2ML IJ SOLN
INTRAMUSCULAR | Status: DC | PRN
  Administered 2015-06-20 (×3): 50 ug via INTRAVENOUS

## 2015-06-20 MED ORDER — MIDAZOLAM HCL 5 MG/5ML IJ SOLN
INTRAMUSCULAR | Status: AC
  Filled 2015-06-20: qty 5

## 2015-06-20 MED ORDER — POTASSIUM CHLORIDE CRYS ER 20 MEQ PO TBCR: 20.0000 meq | EXTENDED_RELEASE_TABLET | Freq: Every day | ORAL | Status: DC | PRN

## 2015-06-20 MED ORDER — DOXYCYCLINE HYCLATE 20 MG PO TABS
20.0000 mg | ORAL_TABLET | Freq: Two times a day (BID) | ORAL | Status: DC
  Administered 2015-06-21 – 2015-06-25 (×8): 20 mg via ORAL
  Filled 2015-06-20 (×10): qty 1

## 2015-06-20 MED ORDER — SORBITOL 70 % SOLN: 30.0000 mL | Freq: Every day | Status: DC | PRN

## 2015-06-20 MED ORDER — IPRATROPIUM-ALBUTEROL 0.5-2.5 (3) MG/3ML IN SOLN
3.0000 mL | Freq: Once | RESPIRATORY_TRACT | Status: AC
  Administered 2015-06-20: 3 mL via RESPIRATORY_TRACT

## 2015-06-20 MED ORDER — HYDROMORPHONE HCL 1 MG/ML IJ SOLN: 0.5000 mg | INTRAMUSCULAR | Status: DC | PRN

## 2015-06-20 MED ORDER — TIOTROPIUM BROMIDE MONOHYDRATE 18 MCG IN CAPS
18.0000 ug | ORAL_CAPSULE | Freq: Every day | RESPIRATORY_TRACT | Status: DC
  Administered 2015-06-21 – 2015-06-25 (×5): 18 ug via RESPIRATORY_TRACT
  Filled 2015-06-20: qty 5

## 2015-06-20 MED ORDER — HYDRALAZINE HCL 20 MG/ML IJ SOLN: 5.0000 mg | INTRAMUSCULAR | Status: DC | PRN

## 2015-06-20 MED ORDER — HYDROCOD POLST-CPM POLST ER 10-8 MG/5ML PO SUER
ORAL | Status: DC | PRN
  Administered 2015-06-20: 5 mL via ORAL

## 2015-06-20 MED ORDER — ATORVASTATIN CALCIUM 10 MG PO TABS
10.0000 mg | ORAL_TABLET | Freq: Every day | ORAL | Status: DC
  Administered 2015-06-21 – 2015-06-25 (×5): 10 mg via ORAL
  Filled 2015-06-20 (×6): qty 1

## 2015-06-20 MED ORDER — ONDANSETRON HCL 4 MG/2ML IJ SOLN: 4.0000 mg | Freq: Four times a day (QID) | INTRAMUSCULAR | Status: DC | PRN

## 2015-06-20 MED ORDER — ASPIRIN 81 MG PO CHEW
81.0000 mg | CHEWABLE_TABLET | Freq: Every day | ORAL | Status: DC
  Administered 2015-06-21 – 2015-06-25 (×5): 81 mg via ORAL
  Filled 2015-06-20 (×5): qty 1

## 2015-06-20 MED ORDER — MOMETASONE FURO-FORMOTEROL FUM 100-5 MCG/ACT IN AERO
2.0000 | INHALATION_SPRAY | Freq: Two times a day (BID) | RESPIRATORY_TRACT | Status: DC
  Administered 2015-06-21 – 2015-06-25 (×9): 2 via RESPIRATORY_TRACT
  Filled 2015-06-20 (×2): qty 8.8

## 2015-06-20 MED ORDER — DEXTROSE 5 % IV SOLN
1.5000 g | INTRAVENOUS | Status: AC
  Administered 2015-06-20: 1.5 g via INTRAVENOUS
  Filled 2015-06-20: qty 1.5

## 2015-06-20 MED ORDER — PHENOL 1.4 % MT LIQD
1.0000 | OROMUCOSAL | Status: DC | PRN
Start: 2015-06-20 — End: 2015-06-25

## 2015-06-20 MED ORDER — PHENYLEPHRINE HCL 10 MG/ML IJ SOLN
INTRAMUSCULAR | Status: DC | PRN
  Administered 2015-06-20: 200 ug via INTRAVENOUS
  Administered 2015-06-20: 100 ug via INTRAVENOUS
  Administered 2015-06-20: 200 ug via INTRAVENOUS
  Administered 2015-06-20 (×2): 100 ug via INTRAVENOUS
  Administered 2015-06-20: 200 ug via INTRAVENOUS
  Administered 2015-06-20 (×2): 100 ug via INTRAVENOUS
  Administered 2015-06-20 (×4): 200 ug via INTRAVENOUS

## 2015-06-20 MED ORDER — SODIUM CHLORIDE 0.9 % IJ SOLN
INTRAMUSCULAR | Status: AC
  Filled 2015-06-20: qty 3

## 2015-06-20 MED ORDER — CEFAZOLIN SODIUM-DEXTROSE 2-3 GM-% IV SOLR
2.0000 g | INTRAVENOUS | Status: AC
  Administered 2015-06-20: 2 g via INTRAVENOUS
  Filled 2015-06-20 (×2): qty 50

## 2015-06-20 MED ORDER — ENOXAPARIN SODIUM 40 MG/0.4ML ~~LOC~~ SOLN
40.0000 mg | SUBCUTANEOUS | Status: DC
  Administered 2015-06-21 – 2015-06-25 (×5): 40 mg via SUBCUTANEOUS
  Filled 2015-06-20 (×5): qty 0.4

## 2015-06-20 MED ORDER — EVICEL 5 ML EX KIT
PACK | CUTANEOUS | Status: DC | PRN
  Administered 2015-06-20: 1

## 2015-06-20 MED ORDER — SODIUM CHLORIDE 0.9 % IV SOLN: 500.0000 mL | Freq: Once | INTRAVENOUS | Status: DC | PRN

## 2015-06-20 MED ORDER — LIDOCAINE HCL 2 % EX GEL
CUTANEOUS | Status: DC | PRN
  Administered 2015-06-20: 1 via TOPICAL

## 2015-06-20 MED ORDER — PROPOFOL 10 MG/ML IV BOLUS
INTRAVENOUS | Status: DC | PRN
  Administered 2015-06-20: 100 mg via INTRAVENOUS

## 2015-06-20 MED ORDER — MIDAZOLAM HCL 2 MG/2ML IJ SOLN
INTRAMUSCULAR | Status: DC | PRN
  Administered 2015-06-20: 2 mg via INTRAVENOUS

## 2015-06-20 MED ORDER — SODIUM CHLORIDE 0.9 % IV SOLN
INTRAVENOUS | Status: DC | PRN
  Administered 2015-06-20: 200 mL via INTRAMUSCULAR

## 2015-06-20 MED ORDER — SODIUM CHLORIDE 0.9 % IV SOLN
10000.0000 ug | INTRAVENOUS | Status: DC | PRN
  Administered 2015-06-20: 40 ug/min via INTRAVENOUS

## 2015-06-20 MED ORDER — ONDANSETRON HCL 4 MG/2ML IJ SOLN: 4.0000 mg | Freq: Once | INTRAMUSCULAR | Status: DC | PRN

## 2015-06-20 MED ORDER — LATANOPROST 0.005 % OP SOLN
1.0000 [drp] | Freq: Every day | OPHTHALMIC | Status: DC
  Administered 2015-06-21 – 2015-06-24 (×5): 1 [drp] via OPHTHALMIC
  Filled 2015-06-20 (×2): qty 2.5

## 2015-06-20 MED ORDER — POLYETHYLENE GLYCOL 3350 17 G PO PACK: 17.0000 g | PACK | Freq: Every day | ORAL | Status: DC | PRN

## 2015-06-20 MED ORDER — SODIUM CHLORIDE 0.9 % IV SOLN
INTRAVENOUS | Status: DC
  Administered 2015-06-20: 09:00:00 via INTRAVENOUS

## 2015-06-20 MED ORDER — FAMOTIDINE 20 MG PO TABS
20.0000 mg | ORAL_TABLET | Freq: Two times a day (BID) | ORAL | Status: DC
  Administered 2015-06-21 – 2015-06-25 (×9): 20 mg via ORAL
  Filled 2015-06-20 (×9): qty 1

## 2015-06-20 MED ORDER — PANTOPRAZOLE SODIUM 40 MG PO TBEC
40.0000 mg | DELAYED_RELEASE_TABLET | Freq: Every day | ORAL | Status: DC
  Administered 2015-06-21 – 2015-06-25 (×5): 40 mg via ORAL
  Filled 2015-06-20 (×5): qty 1

## 2015-06-20 SURGICAL SUPPLY — 84 items
APPLIER CLIP 11 MED OPEN (CLIP) ×4
APPLIER CLIP 9.375 SM OPEN (CLIP) ×4
BAG COUNTER SPONGE EZ (MISCELLANEOUS) ×3 IMPLANT
BAG DECANTER FOR FLEXI CONT (MISCELLANEOUS) ×4 IMPLANT
BLADE SURG 15 STRL LF DISP TIS (BLADE) ×2 IMPLANT
BLADE SURG 15 STRL SS (BLADE) ×2
BLADE SURG SZ11 CARB STEEL (BLADE) ×4 IMPLANT
BOOT SUTURE AID YELLOW STND (SUTURE) ×12 IMPLANT
BRUSH SCRUB 4% CHG (MISCELLANEOUS) ×4 IMPLANT
CANISTER SUCT 1200ML W/VALVE (MISCELLANEOUS) ×4 IMPLANT
CATH EMB LATEX FREE 2FRX60CM (CATHETERS) ×2
CATH EMB LATEX FREE 3FRX80CM (CATHETERS) ×2
CATH EMB LF 2FRX60 (CATHETERS) ×2 IMPLANT
CATH EMB LF 3FRX80 (CATHETERS) ×2 IMPLANT
CATH TRAY 16F METER LATEX (MISCELLANEOUS) ×4 IMPLANT
CLIP APPLIE 11 MED OPEN (CLIP) ×2 IMPLANT
CLIP APPLIE 9.375 SM OPEN (CLIP) ×2 IMPLANT
CLOSURE UNIT VAC ATS (WOUND CARE) ×4 IMPLANT
CORMATRIX IMPLANT
COUNTER SPONGE BAG EZ (MISCELLANEOUS) ×1
DRAPE INCISE IOBAN 66X45 STRL (DRAPES) ×8 IMPLANT
DRESSING SURGICEL FIBRLLR 1X2 (HEMOSTASIS) ×2 IMPLANT
DRSG OPSITE POSTOP 3X4 (GAUZE/BANDAGES/DRESSINGS) ×4 IMPLANT
DRSG SURGICEL FIBRILLAR 1X2 (HEMOSTASIS) ×4
DRSG VAC ATS MED SENSATRAC (GAUZE/BANDAGES/DRESSINGS) ×4 IMPLANT
DURAPREP 26ML APPLICATOR (WOUND CARE) ×4 IMPLANT
ELECT CAUTERY BLADE 6.4 (BLADE) ×4 IMPLANT
GLOVE SURG SYN 8.0 (GLOVE) ×4 IMPLANT
GOWN STRL REUS W/ TWL LRG LVL3 (GOWN DISPOSABLE) ×2 IMPLANT
GOWN STRL REUS W/ TWL XL LVL3 (GOWN DISPOSABLE) ×2 IMPLANT
GOWN STRL REUS W/TWL LRG LVL3 (GOWN DISPOSABLE) ×2
GOWN STRL REUS W/TWL XL LVL3 (GOWN DISPOSABLE) ×2
GRADUATE 1200CC STRL 31836 (MISCELLANEOUS) ×4 IMPLANT
HANDLE YANKAUER SUCT BULB TIP (MISCELLANEOUS) ×4 IMPLANT
HEMOSTAT SURGICEL 2X3 (HEMOSTASIS) ×8 IMPLANT
IV NS 500ML (IV SOLUTION) ×2
IV NS 500ML BAXH (IV SOLUTION) ×2 IMPLANT
JACKSON PRATT 7MM (INSTRUMENTS) ×4 IMPLANT
KIT RM TURNOVER STRD PROC AR (KITS) ×4 IMPLANT
LABEL OR SOLS (LABEL) ×4 IMPLANT
LIQUID BAND (GAUZE/BANDAGES/DRESSINGS) ×4 IMPLANT
LOOP RED MAXI  1X406MM (MISCELLANEOUS) ×4
LOOP VESSEL MAXI 1X406 RED (MISCELLANEOUS) ×4 IMPLANT
LOOP VESSEL MINI 0.8X406 BLUE (MISCELLANEOUS) ×4 IMPLANT
LOOPS BLUE MINI 0.8X406MM (MISCELLANEOUS) ×4
NEEDLE HYPO 18GX1.5 BLUNT FILL (NEEDLE) ×4 IMPLANT
NEEDLE HYPO 25X1 1.5 SAFETY (NEEDLE) ×4 IMPLANT
NS IRRIG 1000ML POUR BTL (IV SOLUTION) ×4 IMPLANT
NS IRRIG 500ML POUR BTL (IV SOLUTION) ×4 IMPLANT
PACK BASIN MAJOR ARMC (MISCELLANEOUS) ×4 IMPLANT
PACK EXTREMITY ARMC (MISCELLANEOUS) ×4 IMPLANT
PACK UNIVERSAL (MISCELLANEOUS) ×4 IMPLANT
PAD GROUND ADULT SPLIT (MISCELLANEOUS) ×4 IMPLANT
PATCH CAROTID ECM VASC 1X10 (Prosthesis & Implant Heart) ×4 IMPLANT
SPONGE LAP 18X18 5 PK (GAUZE/BANDAGES/DRESSINGS) ×4 IMPLANT
STOCKINETTE STRL 4IN 9604848 (GAUZE/BANDAGES/DRESSINGS) ×4 IMPLANT
SUT ETHILON 3 0 PS 1 (SUTURE) ×4 IMPLANT
SUT ETHILON 4-0 (SUTURE) ×4
SUT ETHILON 4-0 FS2 18XMFL BLK (SUTURE) ×4
SUT MNCRL 4-0 (SUTURE) ×2
SUT MNCRL 4-0 27XMFL (SUTURE) ×2
SUT MNCRL+ 5-0 UNDYED PC-3 (SUTURE) ×2 IMPLANT
SUT MONOCRYL 5-0 (SUTURE) ×2
SUT PROLENE 5 0 RB 1 DA (SUTURE) ×32 IMPLANT
SUT PROLENE 6 0 BV (SUTURE) ×52 IMPLANT
SUT PROLENE 7 0 BV 1 (SUTURE) ×4 IMPLANT
SUT SILK 2 0 (SUTURE) ×2
SUT SILK 2-0 18XBRD TIE 12 (SUTURE) ×2 IMPLANT
SUT SILK 3 0 (SUTURE) ×2
SUT SILK 3-0 18XBRD TIE 12 (SUTURE) ×2 IMPLANT
SUT SILK 4 0 (SUTURE) ×2
SUT SILK 4-0 18XBRD TIE 12 (SUTURE) ×2 IMPLANT
SUT VIC AB 0 CT1 36 (SUTURE) ×4 IMPLANT
SUT VIC AB 2-0 CT1 27 (SUTURE) ×8
SUT VIC AB 2-0 CT1 TAPERPNT 27 (SUTURE) ×8 IMPLANT
SUT VIC AB 3-0 SH 27 (SUTURE) ×2
SUT VIC AB 3-0 SH 27X BRD (SUTURE) ×2 IMPLANT
SUT VICRYL+ 3-0 36IN CT-1 (SUTURE) ×8 IMPLANT
SUTURE ETHLN 4-0 FS2 18XMF BLK (SUTURE) ×4 IMPLANT
SUTURE MNCRL 4-0 27XMF (SUTURE) ×2 IMPLANT
SYR 20CC LL (SYRINGE) ×4 IMPLANT
SYR 5ML LL (SYRINGE) ×4 IMPLANT
TOWEL OR 17X26 4PK STRL BLUE (TOWEL DISPOSABLE) ×8 IMPLANT
WND VAC CANISTER 500ML (MISCELLANEOUS) ×4 IMPLANT

## 2015-06-20 SURGICAL SUPPLY — 13 items
CATH ANGIO 5F 100CM .035 PIG (CATHETERS) ×3 IMPLANT
DEVICE CLOSURE MYNXGRIP 5F (Vascular Products) ×3 IMPLANT
DEVICE TORQUE (MISCELLANEOUS) ×3 IMPLANT
DRAPE BRACHIAL (DRAPES) ×6 IMPLANT
GLIDEWIRE ANGLED SS 035X260CM (WIRE) ×3 IMPLANT
NEEDLE ENTRY 21GA 7CM ECHOTIP (NEEDLE) ×3 IMPLANT
PACK ANGIOGRAPHY (CUSTOM PROCEDURE TRAY) ×3 IMPLANT
SET INTRO CAPELLA COAXIAL (SET/KITS/TRAYS/PACK) ×3 IMPLANT
SHEATH BRITE TIP 5FRX11 (SHEATH) ×3 IMPLANT
SYR MEDRAD MARK V 150ML (SYRINGE) ×3 IMPLANT
TOWEL OR 17X26 4PK STRL BLUE (TOWEL DISPOSABLE) ×9 IMPLANT
TUBING CONTRAST HIGH PRESS 72 (TUBING) ×6 IMPLANT
WIRE J 3MM .035X145CM (WIRE) ×3 IMPLANT

## 2015-06-20 NOTE — Anesthesia Postprocedure Evaluation (Signed)
  Anesthesia Post-op Note  Patient: Howard Anderson  Procedure(s) Performed: Procedure(s): Lower Extremity Angiography Lower Extremity Intervention  Anesthesia type:General  Patient location: PACU  Post pain: Pain level controlled  Post assessment: Post-op Vital signs reviewed, Patient's Cardiovascular Status Stable, Respiratory Function Stable, Patent Airway and No signs of Nausea or vomiting  Post vital signs: Reviewed and stable  Last Vitals:  Filed Vitals:   06/20/15 2156  BP: 114/77  Pulse: 87  Temp: 36.7 C  Resp: 16    Level of consciousness: awake, alert  and patient cooperative  Complications: No apparent anesthesia complications

## 2015-06-20 NOTE — Progress Notes (Signed)
eLink Physician-Brief Progress Note Patient Name: CHARISTOPHER SHAUT DOB: 06-22-1946 MRN: [id number]   Date of Service  06/20/2015  HPI/Events of Note  69 yo male with PMH of obesity, PVD, COPD and HTN. Admitted to ICU now s/p repair of L CFA pseudoaneurysm and L brachial artery thrombectomy.  BP = 115/77 and HR = 98. Sat = 94% and RR = 20. Management per vascular surgery.   eICU Interventions  Continue present management.     Intervention Category Evaluation Type: New Patient Evaluation  Lysle Dingwall 06/20/2015, 11:39 PM

## 2015-06-20 NOTE — OR Nursing (Signed)
Pt etco2 d/c has reached base line post sedation. Will send with pt. To OR.Awaiting call from OR, , NPO MAINTAINED.

## 2015-06-20 NOTE — OR Nursing (Signed)
MD in to talk to family & pt. PT to be maintained NPO, will be going to OR later today. Unsuccessful attempt to fix left "anuerysm area via LBA approach Post LBA sheath removal found to have large thrombus at site and pt without left radial pulse, color warm, denies pain.  Area will be addressed in OR today per MD..

## 2015-06-20 NOTE — Op Note (Signed)
[facility]  Percutaneous Study/Intervention Procedural Note   Date of Surgery: 06/20/2015  Surgeon:Eveline Sauve, [name]   Pre-operative Diagnosis: Left groin pseudoaneurysm Post-operative diagnosis:  Left common femoral pseudoaneurysm; thrombosis left brachial artery  Procedure(s) Performed:  1.  Left lower extremity angiography             2.  Attempted thrombin injection of left femoral pseudoaneurysm unsuccessful   Anesthesia: Conscious sedation with IV Versed plus fentanyl  Sheath: 5 French left brachial artery  Contrast: 30 cc  Fluoroscopy Time: Approximately 3 minutes  Indications:  Patient presented to the office several weeks after endograft repair of his abdominal aortic aneurysm. He is complaining of intense pain in his left groin. He notes that approximately 3-4 days after the surgery he felt a pop and then swelling began and the pain developed. On examination he has a large firm mass which is nonpulsatile but has a bruit was auscultated. Ultrasound demonstrated pseudoaneurysm and he is undergoing angiography with the hope for repair via interventional techniques  Procedure:  Howard Stensrud Masseyis a 69 y.o. male who was identified and appropriate procedural time out was performed.  The patient was then placed supine on the table and prepped and draped in the usual sterile fashion.  Ultrasound was used to evaluate the left brachial femoral artery.  It was patent .  A digital ultrasound image was acquired.  A micropuncture needle was used to access the left brachial femoral artery under direct ultrasound guidance and a permanent image was performed.  A 0.035 J wire was advanced without resistance and a 5Fr sheath was placed.    Pigtail catheter and stiff angle Glidewire were then negotiated into the aortic arch and then down the descending aorta through the left limb of the stent graft which appears to be in excellent position and into the external iliac on the  left. Multiple views of the left femoral artery with an obtained with a steep RAO projection demonstrating the neck of the pseudoaneurysm the past. The external iliac common femoral proximal profunda femoris and proximal SFA are all widely patent without any abnormality. Pseudoaneurysm is noted to be quite large. The neck of the pseudoaneurysm is centered on the common femoral in its midportion.  The catheter was then removed over wire and a 5 [place] minx device was deployed without difficulty pressure was held for 5 minutes over the brachial artery puncture site.  Attention was then turned to the groin which was prepped and draped in a sterile fashion. A 2 cc of aliquot of thrombin was prepared the pseudoaneurysm was interrogated with duplex ultrasound a micropuncture needle was introduced into the pseudoaneurysm under direct visualization and the thrombin was injected. Unfortunately this did not result in thrombosis of the pseudoaneurysm which still persists. The aneurysm will then require surgical repair. At the conclusion of the procedure was also noted that he did not have a radial pulse and interrogation of the brachial artery demonstrated its that there is appears to be thrombus at the puncture site. This will also require surgical intervention.  Findings:   Left Lower Extremity:  Pseudoaneurysm identified with its neck in the midportion of the common femoral no other abnormalities are identified. This is not amenable to interventional techniques as a stent would need to be placed across the entire common femoral. This is not a high stick as I suspected. Thrombin injection was attempted but was unsuccessful. He will require surgical repair    Disposition: Patient was  taken to the recovery room in stable condition having tolerated the procedure well.  Howard Anderson, [name] 06/20/2015,11:25 AM

## 2015-06-20 NOTE — Transfer of Care (Signed)
Immediate Anesthesia Transfer of Care Note  Patient: Howard Anderson  Procedure(s) Performed: Procedure(s): Lower Extremity Angiography Lower Extremity Intervention  Patient Location: PACU  Anesthesia Type:General  Level of Consciousness: sedated  Airway & Oxygen Therapy: Patient Spontanous Breathing and Patient connected to face mask oxygen  Post-op Assessment: Report given to RN and Post -op Vital signs reviewed and stable  Post vital signs: Reviewed and stable  Last Vitals:  Filed Vitals:   06/20/15 2156  BP: 114/77  Pulse: 87  Temp: 36.7 C  Resp: 16    Complications: No apparent anesthesia complications

## 2015-06-20 NOTE — H&P (Signed)
[facility] History & Physical Update  The patient was interviewed and re-examined.  The patient's previous History and Physical has been reviewed and is unchanged.  There is no change in the plan of care. We plan to proceed with the scheduled procedure.  The patient underwent angiography earlier today which clearly defined his pseudoaneurysm in the left groin however it clearly showed that the puncture site is in the mid common femoral and this is not an acceptable location for stent placement. His pseudoaneurysm is now 7-8 cm and extremely painful having attempted both intervention as well as thrombin injection neither of which resulted in adequate treatment I will proceed to surgical intervention. Given the change in his brachial artery I will also explore his brachial artery and performed thrombectomy while he is under anesthesia.  The risks and benefits been reviewed with the patient his wife all questions answered patient wishes to proceed with repair of the pseudoaneurysm and brachial thrombectomy     Schnier, [name], MD  06/20/2015, 4:44 PM

## 2015-06-20 NOTE — Anesthesia Preprocedure Evaluation (Addendum)
Anesthesia Evaluation  Patient identified by MRN, date of birth, ID band Patient awake    Reviewed: Allergy & Precautions, NPO status , Patient's Chart, lab work & pertinent test results  Airway Mallampati: III       Dental  (+) Teeth Intact   Pulmonary COPD, former smoker,     + decreased breath sounds      Cardiovascular hypertension, Pt. on medications  Rhythm:Regular Rate:Normal     Neuro/Psych    GI/Hepatic Neg liver ROS, GERD  ,  Endo/Other  negative endocrine ROS  Renal/GU negative Renal ROS     Musculoskeletal   Abdominal (+) + obese,   Peds  Hematology negative hematology ROS (+)   Anesthesia Other Findings   Reproductive/Obstetrics                            Anesthesia Physical Anesthesia Plan  ASA: III  Anesthesia Plan: General   Post-op Pain Management:    Induction: Intravenous  Airway Management Planned: LMA  Additional Equipment:   Intra-op Plan:   Post-operative Plan: Extubation in OR  Informed Consent: I have reviewed the patients History and Physical, chart, labs and discussed the procedure including the risks, benefits and alternatives for the proposed anesthesia with the patient or authorized representative who has indicated his/her understanding and acceptance.     Plan Discussed with: CRNA  Anesthesia Plan Comments:         Anesthesia Quick Evaluation

## 2015-06-20 NOTE — Op Note (Signed)
OPERATIVE NOTE   PROCEDURE: 1. Repair of left common femoral pseudo-aneurysm 2. Left brachial artery thrombectomy  PRE-OPERATIVE DIAGNOSIS: Left common femoral pseudoaneurysm; thrombosis left brachial artery status post angiogram; abdominal aortic aneurysm status post endovascular stent graft placement  POST-OPERATIVE DIAGNOSIS: Same  SURGEON: [name], M.D. ASSISTANT(S): Ms. Hezzie Bump  ANESTHESIA: general  ESTIMATED BLOOD LOSS: 600 cc  FINDING(S): 1.  Pseudoaneurysm of the left common femoral artery; thrombus noted within brachial artery  SPECIMEN(S):  1. Pseudoaneurysm contents; 2. Thrombus from brachial artery  INDICATIONS:   Howard Anderson is a 69 y.o. male who presents with increasing pain and a large mass in his left groin. He has approximately 4 weeks status post endovascular repair of abdominal aortic aneurysm. Duplex ultrasound as well as physical examination were consistent with pseudoaneurysm. Earlier today he one underwent angiography with the hope for possible intervention for resolution unfortunately the anatomical configuration of the left femoral pseudoaneurysm was not amenable to intervention thrombin injection was attempted but this did not work either and therefore he is undergoing surgery. After removal of the left arm sheath and pressure was held for approximately 5 minutes subsequently was noted the patient no longer had a radial pulse repeat ultrasound was consistent with thrombotic material within the brachial artery and since he is already undergoing operative intervention for treatment of his left femoral pseudoaneurysm he will also undergo thrombectomy of the left brachial artery.  DESCRIPTION: After obtaining full informed written consent, the patient was brought back to the operating room and placed supine upon the operating table.  The patient received IV antibiotics prior to induction.  After obtaining adequate anesthesia, the patient was  prepped and draped in the standard fashion appropriate time out is called.    A linear incision is then created in the left groin favoring a more cranial extension and the dissection is carried down through the skin and soft tissues to expose the abdominal wall fascia. The dissection is then moved more caudally until the actual ilioinguinal ligament is identified. The ilioinguinal ligament is then reflected superiorly and the distal external iliac artery is identified. It is then looped with a Silastic vessel loop at the level of the circumflex vessels. 3000 units of heparin was given and allowed to circulate for several minutes.  Vascular clamp was then placed across the external iliac and the actual pseudoaneurysm capsule is entered using a combination of 15 blade scalpel and Bovie cautery. Large amount of thrombus is then removed should be noted that the ultrasound measured the pseudoaneurysm at approximately 8 cm. The neck of the pseudoaneurysm is identified and this is then contained with digital pressure. Having controlled the hemorrhage the superficial femoral artery is then localized below the level of the arteriotomy and it is looped with a Silastic vessel loop. Having identified both a proximal and a distal point of orientation the dissect section is then carried along the artery freeing it entirely from the surrounding inflammatory tissue ultimately the profunda femoris is also identified and this is dissected circumferentially and controlled with a profunda femoris clamp. The more proximal common femoral artery is identified and dissected circumferentially and the clamp was repositioned from the external iliac to the common femoral and at this point having controlled the proximal common femoral profunda as well as superficial femoral we now have hemostasis. Debridement of the inflammatory tissue off the artery is then completed and Potts scissors used to extend the arteriotomy in a transverse fashion  so that the inside  of the artery is well visualized ensuring there is no flap and that the intima will be reapproximated. Subsequently a total of 7 5-0 Prolene sutures are used in an interrupted fashion to repair the femoral artery. Flushing maneuvers were performed back bleeding is noted from both the SFA as well as the profunda femoris flow was excellent and subsequently the suture line is secured.  The wound is then irrigated with 1000 cc and fibrillar with AdvaSeal is placed along the artery proximally and distally. The femoral sheath is then reapproximated with interrupted figure-of-eight 2-0 Vicryl's. A JP drain is brought in through a inferior stab wound incision and laid on top of the femoral sheath and subsequently 2 more layers are used to close the subcutaneous tissues using interrupted 2-0 Vicryl followed by interrupted 3-0 Vicryl. Loosely approximated skin is then closed with 4-0 nylon interrupted vertical mattress and a wound VAC is applied. Excellent seal is noted.  Attention is then turned to the arm which is again prepped and draped in a sterile fashion and a second timeout is called.  Linear incision is then created just above the antecubital fossa and the dissection is carried down through the soft tissues to expose the biceps. Once the bicep muscles been localized the neurovascular sheath is opened and the brachial artery is identified. Small crossing veins are controlled with 6-0 Prolene and 3-0 silk ties. The brachial artery is looped proximally and distally. The site of the arteriotomy is localized. It should be noted that the collagen plug from the minx device was completely outside the artery in fact it was completely located within the subcutaneous tissue.  Once the puncture site on the brachial artery had been identified it was extended laterally and medially with Potts scissors and then a #3 Fogarty was used to clear the thrombus proximally there were 2 Fogarty was used to clear  the thrombus distally excellent forward flow was reestablished and the artery was irrigated with heparinized saline distally there was excellent backbleeding. A total of 4 passes were made proximally 2 passes distally. The brachial artery was then repaired with interrupted 6-0 Prolene total of 5 sutures was used to reconstruct the artery. Flushing maneuvers were performed in the last several sutures were secured and then flow was reestablished to the hand. An easily palpable 2+ radial pulses now appreciated. The wound was then irrigated Surgicel was placed along the suture line and was closed in layers using 3-0 Vicryl followed by 4-0 Monocryl subcuticular and Dermabond.  The patient tolerated procedure well there were no intraoperative complications. He is taken to the recovery area in stable condition.  COMPLICATIONS: None  CONDITION: Stable  [name], M.D. [facility] Office: [phone]   06/20/2015, 10:25 PM

## 2015-06-20 NOTE — Anesthesia Procedure Notes (Signed)
Procedure Name: LMA Insertion Date/Time: 06/20/2015 5:31 PM Performed by: Doreen Salvage Pre-anesthesia Checklist: Patient identified, Patient being monitored, Timeout performed, Emergency Drugs available and Suction available Patient Re-evaluated:Patient Re-evaluated prior to inductionOxygen Delivery Method: Circle system utilized Preoxygenation: Pre-oxygenation with 100% oxygen Intubation Type: IV induction Ventilation: Mask ventilation without difficulty LMA: LMA inserted LMA Size: 5.0 Tube type: Oral Number of attempts: 1 Placement Confirmation: positive ETCO2 and breath sounds checked- equal and bilateral Tube secured with: Tape Dental Injury: Teeth and Oropharynx as per pre-operative assessment

## 2015-06-21 DIAGNOSIS — I679 Cerebrovascular disease, unspecified: Secondary | ICD-10-CM | POA: Diagnosis present

## 2015-06-21 LAB — BASIC METABOLIC PANEL
Anion gap: 5 (ref 5–15)
BUN: 15 mg/dL (ref 6–20)
CO2: 28 mmol/L (ref 22–32)
Calcium: 8 mg/dL — ABNORMAL LOW (ref 8.9–10.3)
Chloride: 109 mmol/L (ref 101–111)
Creatinine, Ser: 0.73 mg/dL (ref 0.61–1.24)
GFR calc Af Amer: >60 mL/min (ref >60)
GFR calc non Af Amer: >60 mL/min (ref >60)
Glucose, Bld: 136 mg/dL — ABNORMAL HIGH (ref 65–99)
Potassium: 4.8 mmol/L (ref 3.5–5.1)
Sodium: 142 mmol/L (ref 135–145)

## 2015-06-21 LAB — CBC
HCT: 31.6 % — ABNORMAL LOW (ref 40.0–52.0)
Hemoglobin: 9.9 g/dL — ABNORMAL LOW (ref 13.0–18.0)
MCH: 27.8 pg (ref 26.0–34.0)
MCHC: 31.3 g/dL — ABNORMAL LOW (ref 32.0–36.0)
MCV: 88.9 fL (ref 80.0–100.0)
Platelets: 282 10*3/uL (ref 150–440)
RBC: 3.55 MIL/uL — ABNORMAL LOW (ref 4.40–5.90)
RDW: 15.6 % — ABNORMAL HIGH (ref 11.5–14.5)
WBC: 6.5 10*3/uL (ref 3.8–10.6)

## 2015-06-21 NOTE — Progress Notes (Signed)
1 Day Post-Op  Subjective: Doing OK. Minimal left groin incisional pain. Otherwise without complaints.  Objective: Vital signs in last 24 hours: Temp:  [97.4 F (36.3 C)-98 F (36.7 C)] 97.6 F (36.4 C) (11/18 2300) Pulse Rate:  [35-111] 81 (11/19 0700) Resp:  [7-20] 18 (11/19 0700) BP: (89-119)/(58-87) 111/81 mmHg (11/19 0700) SpO2:  [83 %-100 %] 98 % (11/19 0700)    Intake/Output from previous day: 11/18 0701 - 11/19 0700 In: 2700 [I.V.:2700] Out: 1365 [Urine:725; Drains:60; Blood:500] Intake/Output this shift:    Gen: Alert, NAD CV: RR Lungs: CTA ABD: soft, NT/ND Ext: left groin vac in place, Scant serosang in JP, Palpable DP        Left arm- warm, incision- C/D/I, palpable radial  Lab Results:   Recent Labs  06/20/15 1421 06/21/15 0811  WBC 5.5 6.5  HGB 10.5* 9.9*  HCT 32.3* 31.6*  PLT 257 282   BMET  Recent Labs  06/19/15 1218 06/21/15 0811  NA  --  142  K  --  4.8  CL  --  109  CO2  --  28  GLUCOSE  --  136*  BUN 19 15  CREATININE 0.81 0.73  CALCIUM  --  8.0*   PT/INR No results for input(s): LABPROT, INR in the last 72 hours. ABG No results for input(s): PHART, HCO3 in the last 72 hours.  Invalid input(s): PCO2, PO2  Studies/Results: No results found.  Anti-infectives: Anti-infectives    Start     Dose/Rate Route Frequency Ordered Stop   06/20/15 2330  cefUROXime (ZINACEF) 1.5 g in dextrose 5 % 50 mL IVPB     1.5 g 100 mL/hr over 30 Minutes Intravenous Every 12 hours 06/20/15 2316 06/21/15 2329   06/20/15 2230  doxycycline (PERIOSTAT) tablet 20 mg     20 mg Oral 2 times daily 06/20/15 2225     06/20/15 1130  ceFAZolin (ANCEF) IVPB 2 g/50 mL premix    Comments:  Send with pt to OR   2 g 100 mL/hr over 30 Minutes Intravenous On call 06/20/15 1125 06/20/15 1738   06/20/15 0520  cefUROXime (ZINACEF) 1.5 g in dextrose 5 % 50 mL IVPB     1.5 g 100 mL/hr over 30 Minutes Intravenous 30 min pre-op 06/20/15 0520 06/20/15 0954       Assessment/Plan: s/p Procedure(s) with comments: EMBOLECTOMY (Left) - brachial thrombectomy ENDARTERECTOMY FEMORAL/FEMORAL PSEUDOANEURYSM repair (Left) - femerol  Doing well. D/C JP D/C Foley Diet as tolerated  Will reassess later likely transfer to floor later today or in am   LOS: 1 day    Jamesetta So A 06/21/2015

## 2015-06-22 DIAGNOSIS — I724 Aneurysm of artery of lower extremity: Secondary | ICD-10-CM | POA: Diagnosis present

## 2015-06-22 LAB — TYPE AND SCREEN
ABO/RH(D): O POS
Antibody Screen: NEGATIVE
Unit division: 0
Unit division: 0

## 2015-06-22 NOTE — Progress Notes (Signed)
2 Days Post-Op  Subjective: Doing Well. Without complaints. Denies pain.  Objective: Vital signs in last 24 hours: Temp:  [97.7 F (36.5 C)-98.9 F (37.2 C)] 97.7 F (36.5 C) (11/20 0400) Pulse Rate:  [72-88] 73 (11/20 0600) Resp:  [12-29] 14 (11/20 0600) BP: (89-123)/(50-78) 118/65 mmHg (11/20 0600) SpO2:  [93 %-100 %] 100 % (11/20 0600)    Intake/Output from previous day: 11/19 0701 - 11/20 0700 In: -  Out: 200 [Urine:200] Intake/Output this shift:    Gen: Alert, NAD CV: RR Lungs: CTA ABD: soft, NT/ND Ext: left groin vac in place, serous drainage from JP site, Palpable DP  Left arm- warm, incision- C/D/I, palpable radial  Lab Results:   Recent Labs  06/20/15 1421 06/21/15 0811  WBC 5.5 6.5  HGB 10.5* 9.9*  HCT 32.3* 31.6*  PLT 257 282   BMET  Recent Labs  06/19/15 1218 06/21/15 0811  NA  --  142  K  --  4.8  CL  --  109  CO2  --  28  GLUCOSE  --  136*  BUN 19 15  CREATININE 0.81 0.73  CALCIUM  --  8.0*   PT/INR No results for input(s): LABPROT, INR in the last 72 hours. ABG No results for input(s): PHART, HCO3 in the last 72 hours.  Invalid input(s): PCO2, PO2  Studies/Results: No results found.  Anti-infectives: Anti-infectives    Start     Dose/Rate Route Frequency Ordered Stop   06/20/15 2330  cefUROXime (ZINACEF) 1.5 g in dextrose 5 % 50 mL IVPB     1.5 g 100 mL/hr over 30 Minutes Intravenous Every 12 hours 06/20/15 2316 06/21/15 1152   06/20/15 2230  doxycycline (PERIOSTAT) tablet 20 mg     20 mg Oral 2 times daily 06/20/15 2225     06/20/15 1130  ceFAZolin (ANCEF) IVPB 2 g/50 mL premix    Comments:  Send with pt to OR   2 g 100 mL/hr over 30 Minutes Intravenous On call 06/20/15 1125 06/20/15 1738   06/20/15 0520  cefUROXime (ZINACEF) 1.5 g in dextrose 5 % 50 mL IVPB     1.5 g 100 mL/hr over 30 Minutes Intravenous 30 min pre-op 06/20/15 0520 06/20/15 0954      Assessment/Plan: s/p Procedure(s) with  comments: EMBOLECTOMY (Left) - brachial thrombectomy ENDARTERECTOMY FEMORAL/FEMORAL PSEUDOANEURYSM repair (Left) - femerol  Doing well Transfer to floor OOB with PT Wound Vac change on Monday   LOS: 2 days    Jamesetta So A 06/22/2015

## 2015-06-23 ENCOUNTER — Encounter: Payer: Self-pay | Admitting: Vascular Surgery

## 2015-06-23 MED FILL — Doxycycline Hyclate Tab 20 MG: ORAL | Qty: 1 | Status: AC

## 2015-06-23 NOTE — Care Management Important Message (Signed)
Important Message  Patient Details  Name: Howard Anderson MRN: [id number] Date of Birth: [birth year]   Medicare Important Message Given:  Yes    Juliann Pulse A Yamilee Harmes 06/23/2015, 10:24 AM

## 2015-06-23 NOTE — Progress Notes (Signed)
[facility]  Daily Progress Note   Subjective  - 4 Days Post-Op  The patient is concerned about his left arm and some redness around the incision he notes that he has not been getting up and walking around the otherwise denies pain  Objective Filed Vitals:   06/22/15 1400 06/22/15 1531 06/22/15 2057 06/23/15 0453  BP: 88/63 103/59 104/63 121/65  Pulse: 75 73 79 79  Temp:  97.9 F (36.6 C) 98.4 F (36.9 C) 98.1 F (36.7 C)  TempSrc:  Oral Oral Oral  Resp: 16 12 19 14   Height:      Weight:      SpO2: 100% 98% 99% 100%    Intake/Output Summary (Last 24 hours) at 06/23/15 1726 Last data filed at 06/23/15 1100  Gross per 24 hour  Intake    360 ml  Output   1400 ml  Net  -1040 ml    PULM  Normal effort , no use of accessory muscles CV  No JVD, RRR Abd      No distended, nontender VASC  the arm incision is well approximated clean dry and intact I believe the volar change surrounding area is secondary to hematoma is not warm it does not blanch and it is not tender. The left groin VAC dressing is clean dry and intact there is markedly less edema in the left leg  Laboratory CBC    Component Value Date/Time   WBC 6.5 06/21/2015 0811   WBC 7.9 08/01/2014 0606   HGB 9.9* 06/21/2015 0811   HGB 12.7* 08/01/2014 0606   HCT 31.6* 06/21/2015 0811   HCT 38.0* 08/01/2014 0606   PLT 282 06/21/2015 0811   PLT 210 08/01/2014 0606    BMET    Component Value Date/Time   NA 142 06/21/2015 0811   NA 139 08/02/2014 1128   K 4.8 06/21/2015 0811   K 4.2 08/02/2014 1128   CL 109 06/21/2015 0811   CL 106 08/02/2014 1128   CO2 28 06/21/2015 0811   CO2 25 08/02/2014 1128   GLUCOSE 136* 06/21/2015 0811   GLUCOSE 175* 08/02/2014 1128   BUN 15 06/21/2015 0811   BUN 16 08/02/2014 1128   CREATININE 0.73 06/21/2015 0811   CREATININE 0.74 08/03/2014 0830   CALCIUM 8.0* 06/21/2015 0811   CALCIUM 7.9* 08/02/2014 1128   GFRNONAA >60 06/21/2015 0811   GFRNONAA >60  08/03/2014 0830   GFRNONAA >60 10/18/2013 0502   GFRAA >60 06/21/2015 0811   GFRAA >60 08/03/2014 0830   GFRAA >60 10/18/2013 0502    Assessment/Planning: POD #3 s/p repair left femoral pseudoaneurysm and left brachial thrombosis   We'll plan for VAC dressing change tomorrow I will at this point likely discharge him with a VAC given the swelling of his leg if the VAC would be removed I suspect the wound would rapidly deteriorate with drainage and become quite macerated. I do not believe the left arm is a problem. He should increase his activities there is no reason he is not getting up out of bed. Hopefully he will be discharged tomorrow    [name]  06/23/2015, 5:26 PM

## 2015-06-23 NOTE — Care Management (Signed)
Spoke patient who is alert and oriented and stated that he is independent from home and drives. Spouse is present in the room. No DME or O2 . Patient has a wound VAC. Will contact surgical to see if he will be going home with device if so will need home health as well.  More to follow.

## 2015-06-24 LAB — SURGICAL PATHOLOGY

## 2015-06-24 MED FILL — Doxycycline Hyclate Tab 20 MG: ORAL | Qty: 1 | Status: AC

## 2015-06-24 NOTE — Progress Notes (Signed)
Doc. Hower notified regarding verapmil dose. Verapmil held due to BP 111/76 pulse 82. Will continue to monitor pt.  Angus Seller

## 2015-06-24 NOTE — Progress Notes (Signed)
[facility]  Daily Progress Note   Subjective  - 4 Days Post-Op  The patient is concerned about his left arm and some redness around the incision he notes that he has not been getting up and walking around the otherwise denies pain  Objective Filed Vitals:   06/23/15 0453 06/23/15 2120 06/24/15 0447 06/24/15 1353  BP: 121/65 131/80 115/71 104/60  Pulse: 79 83 82 88  Temp: 98.1 F (36.7 C) 97.9 F (36.6 C) 98 F (36.7 C) 98.2 F (36.8 C)  TempSrc: Oral Oral Oral Oral  Resp: 14 18 22 18   Height:      Weight:      SpO2: 100% 99% 99% 97%    Intake/Output Summary (Last 24 hours) at 06/24/15 1744 Last data filed at 06/24/15 1300  Gross per 24 hour  Intake    240 ml  Output   1000 ml  Net   -760 ml    PULM  Normal effort , no use of accessory muscles CV  No JVD, RRR Abd      No distended, nontender VASC  the arm incision is well approximated clean dry and intact I believe the volar change surrounding area is secondary to hematoma is not warm it does not blanch and it is not tender. The left groin VAC dressing is changed the wound is clean it readily drains clear fluid.  There is markedly less edema in the left leg  Laboratory CBC    Component Value Date/Time   WBC 6.5 06/21/2015 0811   WBC 7.9 08/01/2014 0606   HGB 9.9* 06/21/2015 0811   HGB 12.7* 08/01/2014 0606   HCT 31.6* 06/21/2015 0811   HCT 38.0* 08/01/2014 0606   PLT 282 06/21/2015 0811   PLT 210 08/01/2014 0606    BMET    Component Value Date/Time   NA 142 06/21/2015 0811   NA 139 08/02/2014 1128   K 4.8 06/21/2015 0811   K 4.2 08/02/2014 1128   CL 109 06/21/2015 0811   CL 106 08/02/2014 1128   CO2 28 06/21/2015 0811   CO2 25 08/02/2014 1128   GLUCOSE 136* 06/21/2015 0811   GLUCOSE 175* 08/02/2014 1128   BUN 15 06/21/2015 0811   BUN 16 08/02/2014 1128   CREATININE 0.73 06/21/2015 0811   CREATININE 0.74 08/03/2014 0830   CALCIUM 8.0* 06/21/2015 0811   CALCIUM 7.9*  08/02/2014 1128   GFRNONAA >60 06/21/2015 0811   GFRNONAA >60 08/03/2014 0830   GFRNONAA >60 10/18/2013 0502   GFRAA >60 06/21/2015 0811   GFRAA >60 08/03/2014 0830   GFRAA >60 10/18/2013 0502    Assessment/Planning: POD #4 s/p repair left femoral pseudoaneurysm and left brachial thrombosis   VAC dressing was changed tomorrow I will at this point likely discharge him with a VAC given the swelling of his leg if the VAC would be removed I suspect the wound would rapidly deteriorate with drainage and become quite macerated.  This is especially true given the fluid that drained out while just changing the dressing. I do not believe the left arm is a problem. He should increase his activities there is no reason he is not getting up out of bed. Hopefully he will be discharged tomorrow    [name]  06/24/2015, 5:44 PM

## 2015-06-24 NOTE — Care Management (Signed)
Spoke with patient and spouse for continued discharge planning. Offered choice of [facility] providers and patient chose to go with [facility] for wound vac changes. Referral placed with Floydene Flock at [facility]. Spouse asked when patient would be able to go back to work and I explained that that would be up to the attending physician. Patient stated that he had been ambulating well without use of any DME and driving self prior to admission.

## 2015-06-24 NOTE — Progress Notes (Signed)
Patient up to the chair with 1 person assistance. Patient able to stand and pivot well to the chair. Dr. [name] in this morning and changed wound vac dressing. Site clean and dry and intact. Wife at the bedside and updated on patient condition.

## 2015-06-24 NOTE — Care Management (Signed)
Spoke with [employer] at [employer].  Portable wound vac will be delivered today around 5 PM. Anticipate discharge soon.

## 2015-06-25 LAB — CBC WITH DIFFERENTIAL/PLATELET
Basophils Absolute: 0 10*3/uL (ref 0–0.1)
Basophils Relative: 1 %
Eosinophils Absolute: 0.1 10*3/uL (ref 0–0.7)
Eosinophils Relative: 1 %
HCT: 29.4 % — ABNORMAL LOW (ref 40.0–52.0)
Hemoglobin: 9.7 g/dL — ABNORMAL LOW (ref 13.0–18.0)
Lymphocytes Relative: 21 %
Lymphs Abs: 1.3 10*3/uL (ref 1.0–3.6)
MCH: 29.1 pg (ref 26.0–34.0)
MCHC: 33 g/dL (ref 32.0–36.0)
MCV: 88 fL (ref 80.0–100.0)
Monocytes Absolute: 0.7 10*3/uL (ref 0.2–1.0)
Monocytes Relative: 10 %
Neutro Abs: 4.4 10*3/uL (ref 1.4–6.5)
Neutrophils Relative %: 67 %
Platelets: 322 10*3/uL (ref 150–440)
RBC: 3.34 MIL/uL — ABNORMAL LOW (ref 4.40–5.90)
RDW: 15.2 % — ABNORMAL HIGH (ref 11.5–14.5)
WBC: 6.5 10*3/uL (ref 3.8–10.6)

## 2015-06-25 LAB — CREATININE, SERUM
Creatinine, Ser: 0.64 mg/dL (ref 0.61–1.24)
GFR calc Af Amer: >60 mL/min (ref >60)
GFR calc non Af Amer: >60 mL/min (ref >60)

## 2015-06-25 NOTE — Care Management Important Message (Signed)
Important Message  Patient Details  Name: Howard Anderson MRN: [id number] Date of Birth: 10-04-1945   Medicare Important Message Given:  Yes    [name] 06/25/2015, 10:03 AM

## 2015-06-25 NOTE — Progress Notes (Signed)
Patient discharged home with home health as ordered,discharge instructions explained and well understood,patient to call on Friday for follow up appointment,iv's removed intact,wound vac changed,supplies given,vital signs within normal limits upon discharge,wife is transporting patient via private vehicle.Left the floor via wheel chair escorted by staff member.

## 2015-06-25 NOTE — Care Management (Signed)
Wound vac is in room, patient sitting in the bedside chair with spouse present.

## 2015-06-25 NOTE — Discharge Summary (Signed)
[facility]    Discharge Summary    Patient ID:  Howard Anderson MRN: [id number] DOB/AGE: 69-Jan-1947 69 y.o.  Admit date: 06/20/2015 Discharge date: 06/25/2015 Date of Surgery: 06/20/2015 Surgeon: Surgeon(s): [name], MD  Admission Diagnosis: LT lower angio   Pseudoaneurym anuerysm  Discharge Diagnoses:  LT lower angio   Pseudoaneurym anuerysm  Secondary Diagnoses: Past Medical History  Diagnosis Date  . Hypertension   . Heart disease   . AAA (abdominal aortic aneurysm) without rupture ([place])   . Chest pain   . Shoulder fracture, right   . COPD (chronic obstructive pulmonary disease) ([place])   . Dizziness and giddiness   . Stroke ([place])     tia  . Shortness of breath dyspnea   . AAA (abdominal aortic aneurysm) without rupture ([place])   . GERD (gastroesophageal reflux disease)     Procedure(s): EMBOLECTOMY ENDARTERECTOMY FEMORAL/FEMORAL PSEUDOANEURYSM repair  Discharged Condition: good  HPI:  The patient is status post endovascular repair of his abdominal aortic aneurysm. He will return to the office last week noting increasing pain and swelling of the left groin. Duplex ultrasound demonstrated a large 7 cm pseudoaneurysm.  On Friday, November 18 he was brought to the hospital where angiography was performed with the hope of intervention for sealing of the pseudoaneurysm. Left brachial artery was accessed. Unfortunately imaging demonstrated the pseudoaneurysm was not secondary to a high stick in the external iliac but was clearly in the midportion of the common femoral artery on the left. This is not a positioned acceptable for stent placement attempts at thrombin injection were unsuccessful and it was determined that he should proceed to the operating room for open repair. As a complication of his angiography was noted that he had lost the pulse on his left hand and therefore at the time surgery brachial thrombectomy was planned as  well.  Hospital course: As noted above following angiography scheduled as an outpatient he was admitted on the night of admission he underwent repair of his left pseudoaneurysm as well as brachial thrombectomy. Both procedures went well and were without intraoperative complication. Following surgery he had restoration of a easily palpable pulse at the level of the left wrist and resolution of his pseudoaneurysm. JP drain was placed at the time surgery just above the femoral sheath and this was removed on postoperative day #1. Over the course of the next 4 days he continued to improve he has been ambulating with minimal assistance a VAC dressing was applied and his skin incision was loosely reapproximated. The edema of his left leg which was rather pronounced preoperatively has now decreased dramatically. First dressing change was performed which demonstrated a healthy-appearing wound although even during the dressing change there was still a moderate amount of clear lymphatic drainage. This suggested that discontinuing the VAC would be unwise as a plain dry gauze dressing would clearly become saturated quickly and macerate the skin in the groin area. Therefore home health was arranged with VAC dressing changes every 3-4 days.  Hospital Course:  Howard Anderson is a 69 y.o. male is S/P Left femoral pseudoaneurysm repair as well as thrombo-embolectomy of the left brachial Procedure(s): EMBOLECTOMY ENDARTERECTOMY FEMORAL/FEMORAL PSEUDOANEURYSM repair Extubated: POD # Night of surgery Physical exam: Left femoral VAC intact wound is clean left brachial artery incision clean dry and intact there is a 2+ radial pulse edema of the left leg is down to 1+ from the preoperative 4+ Post-op wounds healing well Pt. Ambulating,  voiding and taking PO diet without difficulty. Pt pain controlled with PO pain meds. Labs as below Complications:none  Consults:     Significant Diagnostic Studies: CBC Lab Results   Component Value Date   WBC 6.5 06/25/2015   HGB 9.7* 06/25/2015   HCT 29.4* 06/25/2015   MCV 88.0 06/25/2015   PLT 322 06/25/2015    BMET    Component Value Date/Time   NA 142 06/21/2015 0811   NA 139 08/02/2014 1128   K 4.8 06/21/2015 0811   K 4.2 08/02/2014 1128   CL 109 06/21/2015 0811   CL 106 08/02/2014 1128   CO2 28 06/21/2015 0811   CO2 25 08/02/2014 1128   GLUCOSE 136* 06/21/2015 0811   GLUCOSE 175* 08/02/2014 1128   BUN 15 06/21/2015 0811   BUN 16 08/02/2014 1128   CREATININE 0.64 06/25/2015 0419   CREATININE 0.74 08/03/2014 0830   CALCIUM 8.0* 06/21/2015 0811   CALCIUM 7.9* 08/02/2014 1128   GFRNONAA >60 06/25/2015 0419   GFRNONAA >60 08/03/2014 0830   GFRNONAA >60 10/18/2013 0502   GFRAA >60 06/25/2015 0419   GFRAA >60 08/03/2014 0830   GFRAA >60 10/18/2013 0502   COAG Lab Results  Component Value Date   INR 0.98 05/08/2015     Disposition:  Discharge to :Home Discharge Instructions    Call MD for:  redness, tenderness, or signs of infection (pain, swelling, bleeding, redness, odor or green/yellow discharge around incision site)    Complete by:  As directed      Call MD for:  severe or increased pain, loss or decreased feeling  in affected limb(s)    Complete by:  As directed      Call MD for:  temperature >100.5    Complete by:  As directed      Driving Restrictions    Complete by:  As directed   No driving for 2 weeks     Face-to-face encounter (required for Medicare/Medicaid patients)    Complete by:  As directed   I Schnier, [name] certify that this patient is under my care and that I, or a nurse practitioner or physician's assistant working with me, had a face-to-face encounter that meets the physician face-to-face encounter requirements with this patient on 06/25/2015. The encounter with the patient was in whole, or in part for the following medical condition(s) which is the primary reason for home health care (List medical condition):    Wound VAC dressing change to left groin every three days  The encounter with the patient was in whole, or in part, for the following medical condition, which is the primary reason for home health care:  open left groin wound s/p pseudoaneurysm  I certify that, based on my findings, the following services are medically necessary home health services:  Nursing  Reason for Medically Necessary Home Health Services:  Skilled Nursing- Complex Wound Care  My clinical findings support the need for the above services:  OTHER SEE COMMENTS  Further, I certify that my clinical findings support that this patient is homebound due to:  Open/draining pressure/stasis ulcer     Home Health    Complete by:  As directed   To provide the following care/treatments:  RN  Wound VAC dressing change to the left groin every three days  OK to use black sponge     Lifting restrictions    Complete by:  As directed   No lifting for 2 weeks     Resume previous  diet    Complete by:  As directed             Medication List    TAKE these medications        aspirin 81 MG tablet  Take 81 mg by mouth daily.     atorvastatin 10 MG tablet  Commonly known as:  LIPITOR  TAKE 1 TABLET BY MOUTH EVERY DAY     doxycycline 20 MG tablet  Commonly known as:  PERIOSTAT  Take 20 mg by mouth 2 (two) times daily.     Fluticasone-Salmeterol 250-50 MCG/DOSE Aepb  Commonly known as:  ADVAIR  Inhale 1 puff into the lungs 2 (two) times daily.     latanoprost 0.005 % ophthalmic solution  Commonly known as:  XALATAN  1 drop at bedtime.     ranitidine 150 MG tablet  Commonly known as:  ZANTAC  TAKE 1/2 TABLET EVERY DAY     tiotropium 18 MCG inhalation capsule  Commonly known as:  SPIRIVA  Place 18 mcg into inhaler and inhale daily.     verapamil 180 MG 24 hr capsule  Commonly known as:  VERELAN PM  Take 180 mg by mouth 2 (two) times daily.     Vitamin D3 1000 UNITS Caps  Take 1 capsule by mouth 2 (two) times daily.        Verbal and written Discharge instructions given to the patient. Wound care per Discharge AVS   Signed: [name], MD  06/25/2015, 1:42 PM

## 2015-06-27 DIAGNOSIS — Z48812 Encounter for surgical aftercare following surgery on the circulatory system: Secondary | ICD-10-CM | POA: Diagnosis not present

## 2015-06-27 DIAGNOSIS — Z4800 Encounter for change or removal of nonsurgical wound dressing: Secondary | ICD-10-CM | POA: Diagnosis not present

## 2015-06-27 DIAGNOSIS — I724 Aneurysm of artery of lower extremity: Secondary | ICD-10-CM | POA: Diagnosis not present

## 2015-06-27 DIAGNOSIS — I714 Abdominal aortic aneurysm, without rupture: Secondary | ICD-10-CM | POA: Diagnosis not present

## 2015-06-27 DIAGNOSIS — I742 Embolism and thrombosis of arteries of the upper extremities: Secondary | ICD-10-CM | POA: Diagnosis not present

## 2015-06-28 ENCOUNTER — Other Ambulatory Visit: Payer: Self-pay | Admitting: Family Medicine

## 2015-06-30 DIAGNOSIS — I10 Essential (primary) hypertension: Secondary | ICD-10-CM | POA: Diagnosis not present

## 2015-06-30 DIAGNOSIS — J449 Chronic obstructive pulmonary disease, unspecified: Secondary | ICD-10-CM | POA: Diagnosis not present

## 2015-06-30 DIAGNOSIS — R3129 Other microscopic hematuria: Secondary | ICD-10-CM | POA: Diagnosis not present

## 2015-06-30 DIAGNOSIS — Z79899 Other long term (current) drug therapy: Secondary | ICD-10-CM | POA: Diagnosis not present

## 2015-06-30 DIAGNOSIS — R972 Elevated prostate specific antigen [PSA]: Secondary | ICD-10-CM | POA: Diagnosis not present

## 2015-06-30 DIAGNOSIS — N401 Enlarged prostate with lower urinary tract symptoms: Secondary | ICD-10-CM | POA: Diagnosis not present

## 2015-06-30 DIAGNOSIS — Z7982 Long term (current) use of aspirin: Secondary | ICD-10-CM | POA: Diagnosis not present

## 2015-07-01 DIAGNOSIS — I714 Abdominal aortic aneurysm, without rupture: Secondary | ICD-10-CM | POA: Diagnosis not present

## 2015-07-01 DIAGNOSIS — Z4800 Encounter for change or removal of nonsurgical wound dressing: Secondary | ICD-10-CM | POA: Diagnosis not present

## 2015-07-01 DIAGNOSIS — I742 Embolism and thrombosis of arteries of the upper extremities: Secondary | ICD-10-CM | POA: Diagnosis not present

## 2015-07-01 DIAGNOSIS — Z48812 Encounter for surgical aftercare following surgery on the circulatory system: Secondary | ICD-10-CM | POA: Diagnosis not present

## 2015-07-01 DIAGNOSIS — I724 Aneurysm of artery of lower extremity: Secondary | ICD-10-CM | POA: Diagnosis not present

## 2015-07-02 DIAGNOSIS — I742 Embolism and thrombosis of arteries of the upper extremities: Secondary | ICD-10-CM | POA: Diagnosis not present

## 2015-07-02 DIAGNOSIS — Z4800 Encounter for change or removal of nonsurgical wound dressing: Secondary | ICD-10-CM | POA: Diagnosis not present

## 2015-07-02 DIAGNOSIS — I714 Abdominal aortic aneurysm, without rupture: Secondary | ICD-10-CM | POA: Diagnosis not present

## 2015-07-02 DIAGNOSIS — I724 Aneurysm of artery of lower extremity: Secondary | ICD-10-CM | POA: Diagnosis not present

## 2015-07-02 DIAGNOSIS — Z48812 Encounter for surgical aftercare following surgery on the circulatory system: Secondary | ICD-10-CM | POA: Diagnosis not present

## 2015-07-07 DIAGNOSIS — Z4800 Encounter for change or removal of nonsurgical wound dressing: Secondary | ICD-10-CM | POA: Diagnosis not present

## 2015-07-07 DIAGNOSIS — I724 Aneurysm of artery of lower extremity: Secondary | ICD-10-CM | POA: Diagnosis not present

## 2015-07-07 DIAGNOSIS — Z48812 Encounter for surgical aftercare following surgery on the circulatory system: Secondary | ICD-10-CM | POA: Diagnosis not present

## 2015-07-07 DIAGNOSIS — I714 Abdominal aortic aneurysm, without rupture: Secondary | ICD-10-CM | POA: Diagnosis not present

## 2015-07-07 DIAGNOSIS — I742 Embolism and thrombosis of arteries of the upper extremities: Secondary | ICD-10-CM | POA: Diagnosis not present

## 2015-07-08 DIAGNOSIS — N281 Cyst of kidney, acquired: Secondary | ICD-10-CM | POA: Diagnosis not present

## 2015-07-08 DIAGNOSIS — R319 Hematuria, unspecified: Secondary | ICD-10-CM | POA: Diagnosis not present

## 2015-07-08 DIAGNOSIS — N200 Calculus of kidney: Secondary | ICD-10-CM | POA: Diagnosis not present

## 2015-07-09 DIAGNOSIS — I742 Embolism and thrombosis of arteries of the upper extremities: Secondary | ICD-10-CM | POA: Diagnosis not present

## 2015-07-09 DIAGNOSIS — Z4800 Encounter for change or removal of nonsurgical wound dressing: Secondary | ICD-10-CM | POA: Diagnosis not present

## 2015-07-09 DIAGNOSIS — I714 Abdominal aortic aneurysm, without rupture: Secondary | ICD-10-CM | POA: Diagnosis not present

## 2015-07-09 DIAGNOSIS — Z48812 Encounter for surgical aftercare following surgery on the circulatory system: Secondary | ICD-10-CM | POA: Diagnosis not present

## 2015-07-09 DIAGNOSIS — I724 Aneurysm of artery of lower extremity: Secondary | ICD-10-CM | POA: Diagnosis not present

## 2015-07-10 DIAGNOSIS — I714 Abdominal aortic aneurysm, without rupture: Secondary | ICD-10-CM | POA: Diagnosis not present

## 2015-07-10 DIAGNOSIS — I742 Embolism and thrombosis of arteries of the upper extremities: Secondary | ICD-10-CM | POA: Diagnosis not present

## 2015-07-10 DIAGNOSIS — I724 Aneurysm of artery of lower extremity: Secondary | ICD-10-CM | POA: Diagnosis not present

## 2015-07-10 DIAGNOSIS — Z4800 Encounter for change or removal of nonsurgical wound dressing: Secondary | ICD-10-CM | POA: Diagnosis not present

## 2015-07-10 DIAGNOSIS — Z48812 Encounter for surgical aftercare following surgery on the circulatory system: Secondary | ICD-10-CM | POA: Diagnosis not present

## 2015-07-14 ENCOUNTER — Other Ambulatory Visit: Payer: Self-pay | Admitting: Family Medicine

## 2015-07-14 DIAGNOSIS — I742 Embolism and thrombosis of arteries of the upper extremities: Secondary | ICD-10-CM | POA: Diagnosis not present

## 2015-07-14 DIAGNOSIS — I714 Abdominal aortic aneurysm, without rupture: Secondary | ICD-10-CM | POA: Diagnosis not present

## 2015-07-14 DIAGNOSIS — I724 Aneurysm of artery of lower extremity: Secondary | ICD-10-CM | POA: Diagnosis not present

## 2015-07-14 DIAGNOSIS — Z48812 Encounter for surgical aftercare following surgery on the circulatory system: Secondary | ICD-10-CM | POA: Diagnosis not present

## 2015-07-14 DIAGNOSIS — Z4800 Encounter for change or removal of nonsurgical wound dressing: Secondary | ICD-10-CM | POA: Diagnosis not present

## 2015-07-18 DIAGNOSIS — Z4800 Encounter for change or removal of nonsurgical wound dressing: Secondary | ICD-10-CM | POA: Diagnosis not present

## 2015-07-18 DIAGNOSIS — Z48812 Encounter for surgical aftercare following surgery on the circulatory system: Secondary | ICD-10-CM | POA: Diagnosis not present

## 2015-07-18 DIAGNOSIS — I714 Abdominal aortic aneurysm, without rupture: Secondary | ICD-10-CM | POA: Diagnosis not present

## 2015-07-18 DIAGNOSIS — I724 Aneurysm of artery of lower extremity: Secondary | ICD-10-CM | POA: Diagnosis not present

## 2015-07-18 DIAGNOSIS — I742 Embolism and thrombosis of arteries of the upper extremities: Secondary | ICD-10-CM | POA: Diagnosis not present

## 2015-07-21 DIAGNOSIS — I724 Aneurysm of artery of lower extremity: Secondary | ICD-10-CM | POA: Diagnosis not present

## 2015-07-21 DIAGNOSIS — Z4800 Encounter for change or removal of nonsurgical wound dressing: Secondary | ICD-10-CM | POA: Diagnosis not present

## 2015-07-21 DIAGNOSIS — I742 Embolism and thrombosis of arteries of the upper extremities: Secondary | ICD-10-CM | POA: Diagnosis not present

## 2015-07-21 DIAGNOSIS — Z48812 Encounter for surgical aftercare following surgery on the circulatory system: Secondary | ICD-10-CM | POA: Diagnosis not present

## 2015-07-21 DIAGNOSIS — I714 Abdominal aortic aneurysm, without rupture: Secondary | ICD-10-CM | POA: Diagnosis not present

## 2015-07-23 DIAGNOSIS — Z4800 Encounter for change or removal of nonsurgical wound dressing: Secondary | ICD-10-CM | POA: Diagnosis not present

## 2015-07-23 DIAGNOSIS — I724 Aneurysm of artery of lower extremity: Secondary | ICD-10-CM | POA: Diagnosis not present

## 2015-07-23 DIAGNOSIS — Z48812 Encounter for surgical aftercare following surgery on the circulatory system: Secondary | ICD-10-CM | POA: Diagnosis not present

## 2015-07-23 DIAGNOSIS — I742 Embolism and thrombosis of arteries of the upper extremities: Secondary | ICD-10-CM | POA: Diagnosis not present

## 2015-07-23 DIAGNOSIS — I714 Abdominal aortic aneurysm, without rupture: Secondary | ICD-10-CM | POA: Diagnosis not present

## 2015-07-25 DIAGNOSIS — I714 Abdominal aortic aneurysm, without rupture: Secondary | ICD-10-CM | POA: Diagnosis not present

## 2015-07-25 DIAGNOSIS — I742 Embolism and thrombosis of arteries of the upper extremities: Secondary | ICD-10-CM | POA: Diagnosis not present

## 2015-07-25 DIAGNOSIS — Z48812 Encounter for surgical aftercare following surgery on the circulatory system: Secondary | ICD-10-CM | POA: Diagnosis not present

## 2015-07-25 DIAGNOSIS — I724 Aneurysm of artery of lower extremity: Secondary | ICD-10-CM | POA: Diagnosis not present

## 2015-07-25 DIAGNOSIS — Z4800 Encounter for change or removal of nonsurgical wound dressing: Secondary | ICD-10-CM | POA: Diagnosis not present

## 2015-07-28 ENCOUNTER — Other Ambulatory Visit: Payer: Self-pay | Admitting: Family Medicine

## 2015-07-30 ENCOUNTER — Other Ambulatory Visit: Payer: Self-pay

## 2015-07-31 ENCOUNTER — Other Ambulatory Visit: Payer: Self-pay

## 2015-08-07 ENCOUNTER — Ambulatory Visit: Payer: Medicare Other | Attending: Family Medicine

## 2015-08-09 ENCOUNTER — Other Ambulatory Visit: Payer: Self-pay | Admitting: Family Medicine

## 2015-08-21 ENCOUNTER — Other Ambulatory Visit: Payer: Self-pay | Admitting: Family Medicine

## 2015-08-25 ENCOUNTER — Other Ambulatory Visit: Payer: Self-pay

## 2015-08-31 ENCOUNTER — Other Ambulatory Visit: Payer: Self-pay | Admitting: Family Medicine

## 2015-09-10 DIAGNOSIS — I714 Abdominal aortic aneurysm, without rupture: Secondary | ICD-10-CM | POA: Diagnosis not present

## 2015-09-10 DIAGNOSIS — I10 Essential (primary) hypertension: Secondary | ICD-10-CM | POA: Diagnosis not present

## 2015-09-10 DIAGNOSIS — E782 Mixed hyperlipidemia: Secondary | ICD-10-CM | POA: Diagnosis not present

## 2015-09-20 ENCOUNTER — Other Ambulatory Visit: Payer: Self-pay | Admitting: Family Medicine

## 2015-09-22 DIAGNOSIS — Z0000 Encounter for general adult medical examination without abnormal findings: Secondary | ICD-10-CM | POA: Diagnosis not present

## 2015-09-22 DIAGNOSIS — I10 Essential (primary) hypertension: Secondary | ICD-10-CM | POA: Diagnosis not present

## 2015-09-22 DIAGNOSIS — H353132 Nonexudative age-related macular degeneration, bilateral, intermediate dry stage: Secondary | ICD-10-CM | POA: Diagnosis not present

## 2015-09-22 DIAGNOSIS — M79609 Pain in unspecified limb: Secondary | ICD-10-CM | POA: Diagnosis not present

## 2015-09-22 DIAGNOSIS — E785 Hyperlipidemia, unspecified: Secondary | ICD-10-CM | POA: Diagnosis not present

## 2015-09-22 DIAGNOSIS — I714 Abdominal aortic aneurysm, without rupture: Secondary | ICD-10-CM | POA: Diagnosis not present

## 2015-09-25 ENCOUNTER — Other Ambulatory Visit: Payer: Self-pay

## 2015-09-29 DIAGNOSIS — K219 Gastro-esophageal reflux disease without esophagitis: Secondary | ICD-10-CM | POA: Diagnosis not present

## 2015-09-29 DIAGNOSIS — I10 Essential (primary) hypertension: Secondary | ICD-10-CM | POA: Diagnosis not present

## 2015-09-29 DIAGNOSIS — J449 Chronic obstructive pulmonary disease, unspecified: Secondary | ICD-10-CM | POA: Diagnosis not present

## 2015-09-29 DIAGNOSIS — I714 Abdominal aortic aneurysm, without rupture: Secondary | ICD-10-CM | POA: Diagnosis not present

## 2015-09-29 DIAGNOSIS — R3129 Other microscopic hematuria: Secondary | ICD-10-CM | POA: Insufficient documentation

## 2015-09-29 DIAGNOSIS — R972 Elevated prostate specific antigen [PSA]: Secondary | ICD-10-CM | POA: Diagnosis not present

## 2015-09-29 DIAGNOSIS — Z01818 Encounter for other preprocedural examination: Secondary | ICD-10-CM | POA: Diagnosis not present

## 2015-09-30 ENCOUNTER — Other Ambulatory Visit: Payer: Self-pay | Admitting: Family Medicine

## 2015-10-04 ENCOUNTER — Other Ambulatory Visit: Payer: Self-pay | Admitting: Family Medicine

## 2015-10-08 DIAGNOSIS — I10 Essential (primary) hypertension: Secondary | ICD-10-CM | POA: Diagnosis not present

## 2015-10-08 DIAGNOSIS — J449 Chronic obstructive pulmonary disease, unspecified: Secondary | ICD-10-CM | POA: Diagnosis not present

## 2015-10-08 DIAGNOSIS — Z7982 Long term (current) use of aspirin: Secondary | ICD-10-CM | POA: Diagnosis not present

## 2015-10-08 DIAGNOSIS — Z8673 Personal history of transient ischemic attack (TIA), and cerebral infarction without residual deficits: Secondary | ICD-10-CM | POA: Diagnosis not present

## 2015-10-08 DIAGNOSIS — Z87891 Personal history of nicotine dependence: Secondary | ICD-10-CM | POA: Diagnosis not present

## 2015-10-08 DIAGNOSIS — C61 Malignant neoplasm of prostate: Secondary | ICD-10-CM | POA: Diagnosis not present

## 2015-10-08 DIAGNOSIS — R972 Elevated prostate specific antigen [PSA]: Secondary | ICD-10-CM | POA: Diagnosis not present

## 2015-10-08 DIAGNOSIS — Z79899 Other long term (current) drug therapy: Secondary | ICD-10-CM | POA: Diagnosis not present

## 2015-10-08 DIAGNOSIS — R3129 Other microscopic hematuria: Secondary | ICD-10-CM | POA: Diagnosis not present

## 2015-10-08 DIAGNOSIS — E785 Hyperlipidemia, unspecified: Secondary | ICD-10-CM | POA: Diagnosis not present

## 2015-10-10 ENCOUNTER — Encounter: Payer: Self-pay | Admitting: *Deleted

## 2015-10-21 ENCOUNTER — Other Ambulatory Visit: Payer: Self-pay | Admitting: Urology

## 2015-10-21 DIAGNOSIS — C61 Malignant neoplasm of prostate: Secondary | ICD-10-CM | POA: Diagnosis not present

## 2015-10-29 ENCOUNTER — Encounter: Payer: Self-pay | Admitting: Family Medicine

## 2015-10-29 ENCOUNTER — Ambulatory Visit (INDEPENDENT_AMBULATORY_CARE_PROVIDER_SITE_OTHER): Payer: Medicare Other | Admitting: Family Medicine

## 2015-10-29 VITALS — BP 120/80 | HR 78 | Ht 67.0 in | Wt 212.0 lb

## 2015-10-29 DIAGNOSIS — I10 Essential (primary) hypertension: Secondary | ICD-10-CM | POA: Diagnosis not present

## 2015-10-29 DIAGNOSIS — K219 Gastro-esophageal reflux disease without esophagitis: Secondary | ICD-10-CM

## 2015-10-29 DIAGNOSIS — H8309 Labyrinthitis, unspecified ear: Secondary | ICD-10-CM | POA: Insufficient documentation

## 2015-10-29 DIAGNOSIS — Z23 Encounter for immunization: Secondary | ICD-10-CM | POA: Diagnosis not present

## 2015-10-29 DIAGNOSIS — E785 Hyperlipidemia, unspecified: Secondary | ICD-10-CM

## 2015-10-29 DIAGNOSIS — J432 Centrilobular emphysema: Secondary | ICD-10-CM | POA: Diagnosis not present

## 2015-10-29 MED ORDER — VERAPAMIL HCL ER 180 MG PO TBCR: 180.0000 mg | EXTENDED_RELEASE_TABLET | Freq: Two times a day (BID) | ORAL | Status: DC

## 2015-10-29 MED ORDER — ATORVASTATIN CALCIUM 10 MG PO TABS: 10.0000 mg | ORAL_TABLET | Freq: Every day | ORAL | Status: DC

## 2015-10-29 MED ORDER — MECLIZINE HCL 25 MG PO TABS: 25.0000 mg | ORAL_TABLET | Freq: Three times a day (TID) | ORAL | Status: DC | PRN

## 2015-10-29 MED ORDER — RANITIDINE HCL 150 MG PO TABS: 75.0000 mg | ORAL_TABLET | Freq: Every day | ORAL | Status: DC

## 2015-10-29 MED ORDER — OMEGA-3-ACID ETHYL ESTERS 1 G PO CAPS: 1.0000 | ORAL_CAPSULE | Freq: Two times a day (BID) | ORAL | Status: DC

## 2015-10-29 MED ORDER — ASPIRIN 81 MG PO TABS: 81.0000 mg | ORAL_TABLET | Freq: Every day | ORAL | Status: DC

## 2015-10-29 MED ORDER — TIOTROPIUM BROMIDE MONOHYDRATE 2.5 MCG/ACT IN AERS: INHALATION_SPRAY | RESPIRATORY_TRACT | Status: DC

## 2015-10-29 MED ORDER — FLUTICASONE-SALMETEROL 250-50 MCG/DOSE IN AEPB: 1.0000 | INHALATION_SPRAY | Freq: Two times a day (BID) | RESPIRATORY_TRACT | Status: DC

## 2015-10-29 NOTE — Progress Notes (Signed)
Name: Howard Anderson   MRN: [id number]    DOB: Apr 10, 1946   Date:10/30/2015       Progress Note  Subjective  Chief Complaint  Chief Complaint  Patient presents with  . Hypertension  . Hyperlipidemia  . COPD  . Gastroesophageal Reflux    Hypertension This is a chronic problem. The current episode started more than 1 year ago. The problem has been gradually improving since onset. The problem is controlled. Pertinent negatives include no anxiety, blurred vision, chest pain, headaches, malaise/fatigue, neck pain, orthopnea, palpitations, peripheral edema, PND, shortness of breath or sweats. There are no associated agents to hypertension. There are no known risk factors for coronary artery disease. The current treatment provides no improvement. There are no compliance problems.  There is no history of angina, kidney disease, CAD/MI, CVA, heart failure, left ventricular hypertrophy, PVD, renovascular disease or retinopathy. There is no history of chronic renal disease or a hypertension causing med.  Hyperlipidemia This is a recurrent problem. The problem is controlled. He has no history of chronic renal disease, diabetes, hypothyroidism, liver disease, obesity or nephrotic syndrome. There are no known factors aggravating his hyperlipidemia. Pertinent negatives include no chest pain, focal sensory loss, focal weakness, leg pain, myalgias or shortness of breath. He is currently on no antihyperlipidemic treatment. The current treatment provides mild improvement of lipids. There are no compliance problems.   Gastroesophageal Reflux He reports no abdominal pain, no belching, no chest pain, no choking, no dysphagia, no early satiety, no nausea, no sore throat or no wheezing. This is a recurrent problem. The problem has been gradually improving. The symptoms are aggravated by certain foods. He has tried a PPI for the symptoms. The treatment provided mild relief. Past procedures do not include an abdominal  ultrasound or an EGD.    No problem-specific assessment & plan notes found for this encounter.   Past Medical History  Diagnosis Date  . Hypertension   . Heart disease   . AAA (abdominal aortic aneurysm) without rupture ([place])   . Chest pain   . Shoulder fracture, right   . COPD (chronic obstructive pulmonary disease) ([place])   . Dizziness and giddiness   . Stroke ([place])     tia  . Shortness of breath dyspnea   . AAA (abdominal aortic aneurysm) without rupture ([place])   . GERD (gastroesophageal reflux disease)   . Hyperlipidemia     Past Surgical History  Procedure Laterality Date  . Colonoscopy  2010  . Inner ear surgery    . Peripheral vascular catheterization N/A 05/21/2015    Procedure: Endovascular Repair/Stent Graft;  Surgeon: [name], MD;  Location: [facility];  Service: Cardiovascular;  Laterality: N/A;  . Peripheral vascular catheterization  06/20/2015    Procedure: Lower Extremity Angiography;  Surgeon: [name], MD;  Location: [facility];  Service: Cardiovascular;;  . Peripheral vascular catheterization  06/20/2015    Procedure: Lower Extremity Intervention;  Surgeon: [name], MD;  Location: [facility];  Service: Cardiovascular;;  . Embolectomy Left 06/20/2015    Procedure: EMBOLECTOMY;  Surgeon: [name], MD;  Location: [facility];  Service: Vascular;  Laterality: Left;  brachial thrombectomy  . Endarterectomy femoral Left 06/20/2015    Procedure: ENDARTERECTOMY FEMORAL/FEMORAL PSEUDOANEURYSM repair;  Surgeon: [name], MD;  Location: [facility];  Service: Vascular;  Laterality: Left;  femerol    History reviewed. No pertinent family history.  Social History   Social  History  . Marital Status: Married    Spouse Name: N/A  . Number of Children: N/A  . Years of Education: N/A   Occupational History  . Not on file.   Social History Main Topics  . Smoking status: Former Smoker -- 1.50  packs/day for 48 years    Quit date: 10/10/2013  . Smokeless tobacco: Never Used  . Alcohol Use: No  . Drug Use: No  . Sexual Activity: Not on file   Other Topics Concern  . Not on file   Social History Narrative    Allergies  Allergen Reactions  . No Known Allergies      Review of Systems  Constitutional: Negative for malaise/fatigue.  HENT: Negative for sore throat.   Eyes: Negative for blurred vision.  Respiratory: Negative for choking, shortness of breath and wheezing.   Cardiovascular: Negative for chest pain, palpitations, orthopnea and PND.  Gastrointestinal: Negative for dysphagia, nausea and abdominal pain.  Musculoskeletal: Negative for myalgias and neck pain.  Neurological: Negative for focal weakness and headaches.     Objective  Filed Vitals:   10/29/15 1041  BP: 120/80  Pulse: 78  Height: 5\' 7"  (1.702 m)  Weight: 212 lb (96.163 kg)    Physical Exam  Constitutional: He is oriented to person, place, and time and well-developed, well-nourished, and in no distress.  HENT:  Head: Normocephalic.  Right Ear: External ear normal.  Left Ear: External ear normal.  Nose: Nose normal.  Mouth/Throat: Oropharynx is clear and moist.  Eyes: Conjunctivae and EOM are normal. Pupils are equal, round, and reactive to light. Right eye exhibits no discharge. Left eye exhibits no discharge. No scleral icterus.  Neck: Normal range of motion. Neck supple. No JVD present. No tracheal deviation present. No thyromegaly present.  Cardiovascular: Normal rate, regular rhythm, normal heart sounds and intact distal pulses.  Exam reveals no gallop and no friction rub.   No murmur heard. Pulmonary/Chest: Breath sounds normal. No respiratory distress. He has no wheezes. He has no rales.  Abdominal: Soft. Bowel sounds are normal. He exhibits no mass. There is no hepatosplenomegaly. There is no tenderness. There is no rebound, no guarding and no CVA tenderness.  Musculoskeletal: Normal  range of motion. He exhibits no edema or tenderness.  Lymphadenopathy:    He has no cervical adenopathy.  Neurological: He is alert and oriented to person, place, and time. He has normal sensation, normal strength, normal reflexes and intact cranial nerves. No cranial nerve deficit.  Skin: Skin is warm. No rash noted.  Psychiatric: Mood and affect normal.  Nursing note and vitals reviewed.     Assessment & Plan  Problem List Items Addressed This Visit      Cardiovascular and Mediastinum   Essential hypertension - Primary   Relevant Medications   aspirin 81 MG tablet   atorvastatin (LIPITOR) 10 MG tablet   omega-3 acid ethyl esters (LOVAZA) 1 g capsule   verapamil (CALAN-SR) 180 MG CR tablet   Other Relevant Orders   Renal Function Panel (Completed)     Respiratory   Centrilobular emphysema (HCC)   Relevant Medications   Fluticasone-Salmeterol (ADVAIR) 250-50 MCG/DOSE AEPB   Tiotropium Bromide Monohydrate (SPIRIVA RESPIMAT) 2.5 MCG/ACT AERS     Digestive   Esophageal reflux   Relevant Medications   ranitidine (ZANTAC) 150 MG tablet   meclizine (ANTIVERT) 25 MG tablet     Nervous and Auditory   Labyrinthitis   Relevant Medications   meclizine (ANTIVERT) 25 MG  tablet     Other   Hyperlipidemia   Relevant Medications   aspirin 81 MG tablet   atorvastatin (LIPITOR) 10 MG tablet   omega-3 acid ethyl esters (LOVAZA) 1 g capsule   verapamil (CALAN-SR) 180 MG CR tablet   Other Relevant Orders   Lipid Profile (Completed)    Other Visit Diagnoses    Need for pneumococcal vaccination        Relevant Orders    Pneumococcal conjugate vaccine 13-valent (Completed)         Dr. Deanna Jones [facility]  10/30/2015

## 2015-10-30 ENCOUNTER — Other Ambulatory Visit: Payer: Self-pay

## 2015-10-30 LAB — LIPID PANEL
Chol/HDL Ratio: 1.9 ratio units (ref 0.0–5.0)
Cholesterol, Total: 109 mg/dL (ref 100–199)
HDL: 57 mg/dL (ref >39)
LDL Calculated: 37 mg/dL (ref 0–99)
Triglycerides: 76 mg/dL (ref 0–149)
VLDL Cholesterol Cal: 15 mg/dL (ref 5–40)

## 2015-10-30 LAB — RENAL FUNCTION PANEL
Albumin: 4.5 g/dL (ref 3.5–4.8)
BUN/Creatinine Ratio: 17 (ref 10–22)
BUN: 15 mg/dL (ref 8–27)
CO2: 26 mmol/L (ref 18–29)
Calcium: 9.3 mg/dL (ref 8.6–10.2)
Chloride: 99 mmol/L (ref 96–106)
Creatinine, Ser: 0.86 mg/dL (ref 0.76–1.27)
GFR calc Af Amer: 102 mL/min/{1.73_m2} (ref >59)
GFR calc non Af Amer: 88 mL/min/{1.73_m2} (ref >59)
Glucose: 103 mg/dL — ABNORMAL HIGH (ref 65–99)
Phosphorus: 3.4 mg/dL (ref 2.5–4.5)
Potassium: 4.6 mmol/L (ref 3.5–5.2)
Sodium: 141 mmol/L (ref 134–144)

## 2015-11-11 IMAGING — CR DG SHOULDER 3+V*R*
1 series · 4 of 4 positions shown · non-contrast
Comparison: Chest x-ray July 27, 2014

CLINICAL DATA: Status post fall 5 days ago with right shoulder
pain.

EXAM:
DG SHOULDER 3+ VIEWS RIGHT

[Series 1: dxr shoulder right complete · 0.14mm/px · 4 of 4 slices shown]
[im 1/4]
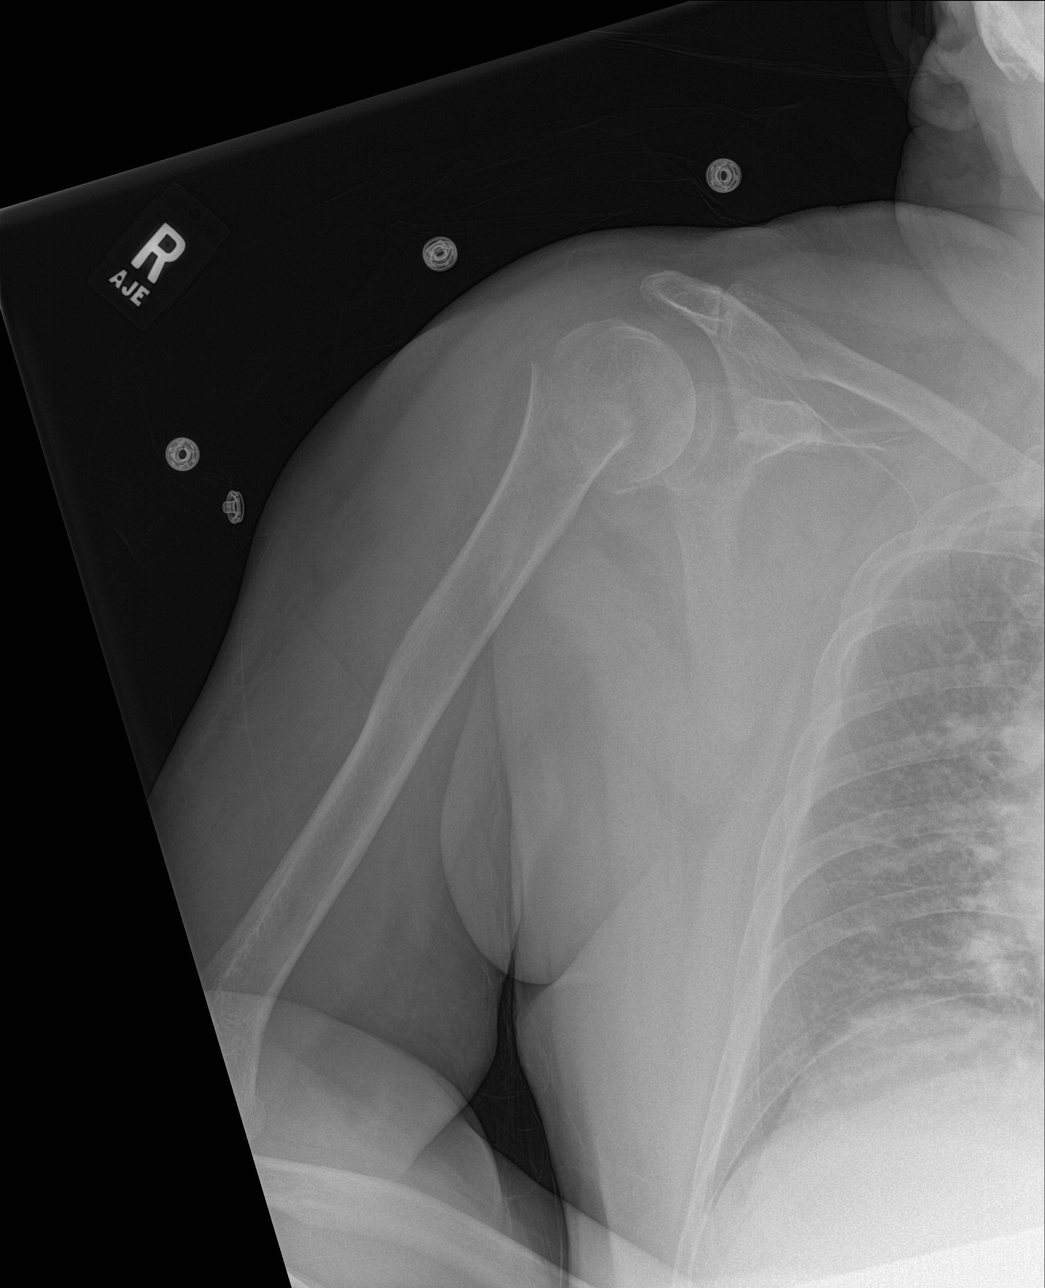
[im 2/4]
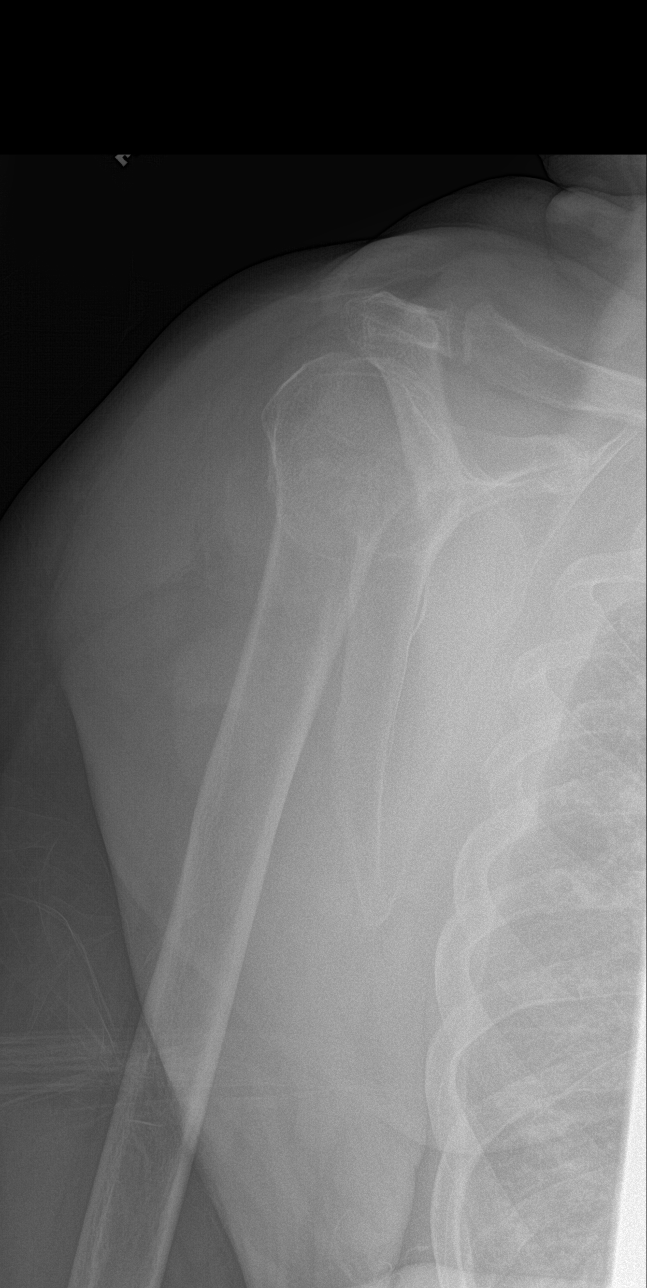
[im 3/4]
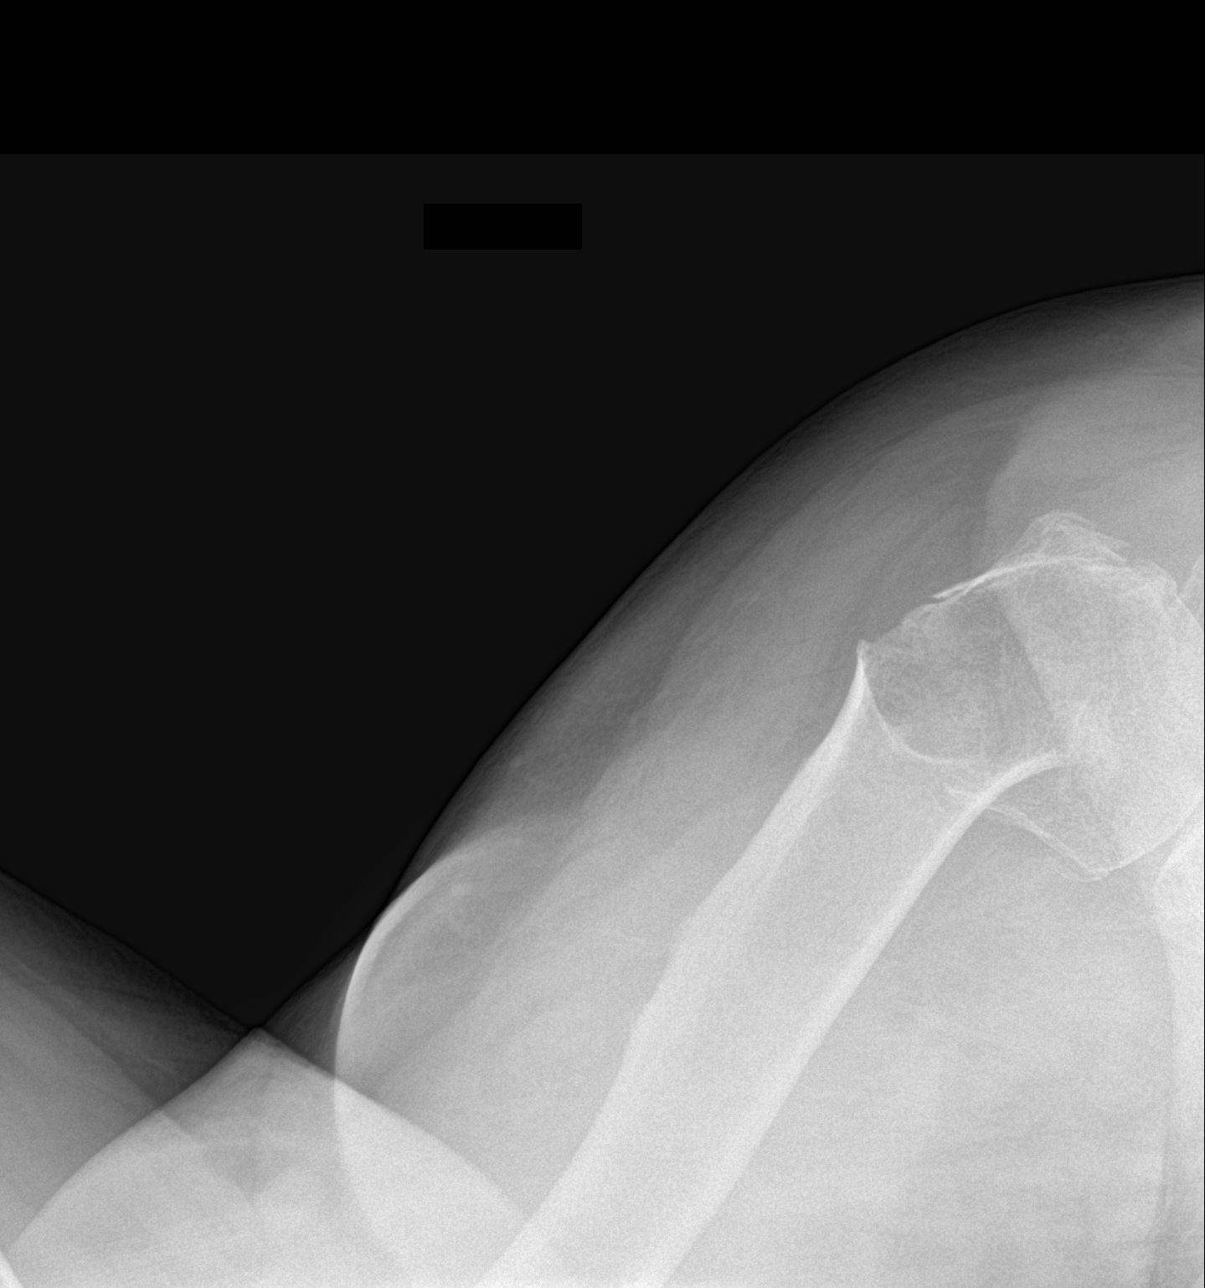
[im 4/4]
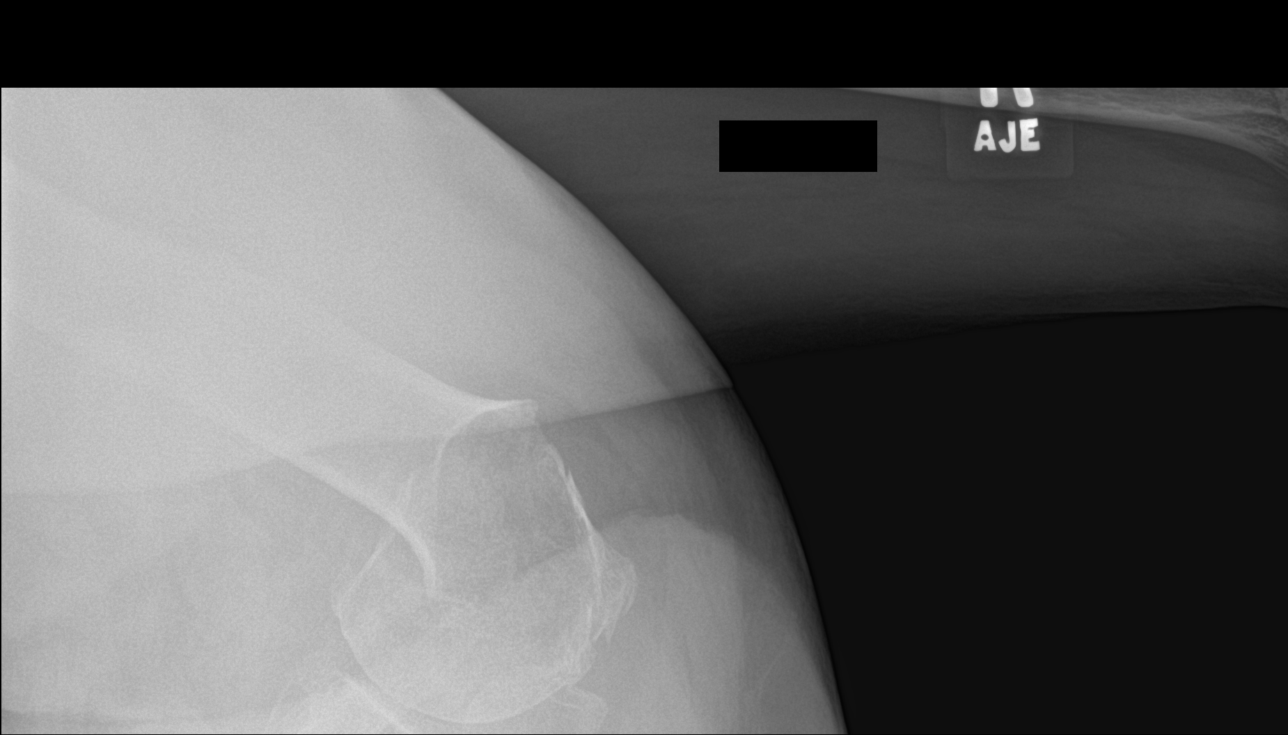

[4 of 4 positions shown; findings below may reference images not displayed]

FINDINGS: There is comminuted displaced fracture of the proximal right humerus
at the humeral neckwith the shaft displaced slightly superiorly and
laterally relative to the humeral head. There is mild displaced
fracture of the lateral right fifth rib.
IMPRESSION: Fractures of proximal right humerus and lateral right fifth rib.

## 2015-11-20 ENCOUNTER — Encounter
Admission: RE | Admit: 2015-11-20 | Discharge: 2015-11-20 | Disposition: A | Discharge status: Home / Self Care | Payer: Medicare Other | Source: Ambulatory Visit | Attending: Urology | Admitting: Urology

## 2015-11-20 DIAGNOSIS — C61 Malignant neoplasm of prostate: Secondary | ICD-10-CM | POA: Diagnosis not present

## 2015-11-20 DIAGNOSIS — Z8546 Personal history of malignant neoplasm of prostate: Secondary | ICD-10-CM | POA: Diagnosis not present

## 2015-11-20 HISTORY — DX: Malignant (primary) neoplasm, unspecified: C80.1

## 2015-11-20 MED ORDER — TECHNETIUM TC 99M MEDRONATE IV KIT
25.0000 | PACK | Freq: Once | INTRAVENOUS | Status: AC | PRN
  Administered 2015-11-20: 22.65 via INTRAVENOUS

## 2015-12-11 ENCOUNTER — Ambulatory Visit
Admission: RE | Admit: 2015-12-11 | Discharge: 2015-12-11 | Disposition: A | Discharge status: Home / Self Care | Payer: Medicare Other | Source: Ambulatory Visit | Attending: Radiation Oncology | Admitting: Radiation Oncology

## 2015-12-11 ENCOUNTER — Encounter: Payer: Self-pay | Admitting: Radiation Oncology

## 2015-12-11 VITALS — BP 119/80 | HR 79 | Temp 96.8°F | Resp 18

## 2015-12-11 DIAGNOSIS — Z510 Encounter for antineoplastic radiation therapy: Secondary | ICD-10-CM | POA: Insufficient documentation

## 2015-12-11 DIAGNOSIS — C61 Malignant neoplasm of prostate: Secondary | ICD-10-CM | POA: Diagnosis not present

## 2015-12-11 NOTE — Consult Note (Signed)
Except an outstanding is perfect of Radiation Oncology NEW PATIENT EVALUATION  Name: Howard Anderson  MRN: [id number]  Date:   12/11/2015     DOB: [birth year]   This 70 y.o. male patient presents to the clinic for initial evaluation of adenocarcinoma the prostate stage IIa (T1 CN 0 M0) presenting with a PSA slightly over 10 and a Gleason 7 (3+4)..  REFERRING PHYSICIAN: Juline Patch, MD  CHIEF COMPLAINT:  Chief Complaint  Patient presents with  . Prostate Cancer    Pt is here for initial consultation    DIAGNOSIS: The encounter diagnosis was Malignant neoplasm of prostate ([place]).   PREVIOUS INVESTIGATIONS:  Bone scan and CT scan have been requested for my review reports reviewed Pathology report reviewed Clinical notes reviewed  HPI: Patient is a 70 year old male presented was PSA with a PSA around 10 initially tried an empiric antibiotic therapy although elevated PSA persisted he was seen by urology underwent transrectal ultrasound-guided biopsy showing 6 cores all positive for adenocarcinoma Gleason score 7 (3+4). CT scan was performed as well as bone scan showing no evidence to suggest extracapsular extension pelvic adenopathy or bone metastasis. Treatment options were discussed with the patient he has multiple comorbidities including femoral artery aneurysm heart valve disease essential hypertension COPD transient cerebral ischemia. He continues to have nocturia 2-3 no urinary incontinence has not been tried on any Flomax or medication for that at this time. He is referred today to radiation oncology for opinion. His prostate is been measured at close to 60 cc.  PLANNED TREATMENT REGIMEN: Image guidedIMRT  PAST MEDICAL HISTORY:  has a past medical history of Hypertension; Heart disease; AAA (abdominal aortic aneurysm) without rupture ([place]); Chest pain; Shoulder fracture, right; COPD (chronic obstructive pulmonary disease) ([place]); Dizziness and giddiness; Stroke [facility]); Shortness of  breath dyspnea; AAA (abdominal aortic aneurysm) without rupture ([facility]); GERD (gastroesophageal reflux disease); Hyperlipidemia; and Cancer ([place]).    PAST SURGICAL HISTORY:  Past Surgical History  Procedure Laterality Date  . Colonoscopy  2010  . Inner ear surgery    . Peripheral vascular catheterization N/A 05/21/2015    Procedure: Endovascular Repair/Stent Graft;  Surgeon: [name], MD;  Location: [place] [facility];  Service: Cardiovascular;  Laterality: N/A;  . Peripheral vascular catheterization  06/20/2015    Procedure: Lower Extremity Angiography;  Surgeon: [name], MD;  Location: [place] [facility];  Service: Cardiovascular;;  . Peripheral vascular catheterization  06/20/2015    Procedure: Lower Extremity Intervention;  Surgeon: [name], MD;  Location: [place] [facility];  Service: Cardiovascular;;  . Embolectomy Left 06/20/2015    Procedure: EMBOLECTOMY;  Surgeon: [name], MD;  Location: [facility];  Service: Vascular;  Laterality: Left;  brachial thrombectomy  . Endarterectomy femoral Left 06/20/2015    Procedure: ENDARTERECTOMY FEMORAL/FEMORAL PSEUDOANEURYSM repair;  Surgeon: [name], MD;  Location: [facility];  Service: Vascular;  Laterality: Left;  femerol    FAMILY HISTORY: family history is not on file.  SOCIAL HISTORY:  reports that he quit smoking about 2 years ago. He has never used smokeless tobacco. He reports that he does not drink alcohol or use illicit drugs.  ALLERGIES: No known allergies  MEDICATIONS:  Current Outpatient Prescriptions  Medication Sig Dispense Refill  . aspirin 81 MG tablet Take 1 tablet (81 mg total) by mouth daily. 30 tablet 11  . atorvastatin (LIPITOR) 10 MG tablet Take 1 tablet (10 mg total) by mouth daily. 30 tablet 6  .  Cholecalciferol (VITAMIN D3) 1000 UNITS CAPS Take 1 capsule by mouth 2 (two) times daily.    [name] doxycycline (PERIOSTAT) 20 MG tablet Take 20 mg by mouth 2 (two) times daily.     . Fluticasone-Salmeterol (ADVAIR) 250-50 MCG/DOSE AEPB Inhale 1 puff into the lungs 2 (two) times daily. 60 each 6  . latanoprost (XALATAN) 0.005 % ophthalmic solution 1 drop at bedtime.     . meclizine (ANTIVERT) 25 MG tablet Take 1 tablet (25 mg total) by mouth 3 (three) times daily as needed for dizziness. 30 tablet 6  . Misc Natural Products (CYSTEX) LIQD Take 1 Dose by mouth.    . Multiple Vitamins-Minerals (PRESERVISION AREDS PO) Take 1 capsule by mouth 2 (two) times daily.    [name] omega-3 acid ethyl esters (LOVAZA) 1 g capsule Take 1 capsule (1 g total) by mouth 2 (two) times daily. 60 capsule 6  . Propylene Glycol (SYSTANE BALANCE) 0.6 % SOLN Apply 1 drop to eye 2 (two) times daily.    . ranitidine (ZANTAC) 150 MG tablet Take 0.5 tablets (75 mg total) by mouth daily. 30 tablet 6  . Tiotropium Bromide Monohydrate (SPIRIVA RESPIMAT) 2.5 MCG/ACT AERS INHALE 1 INHALATION DAILY 1 Inhaler 6  . verapamil (CALAN-SR) 180 MG CR tablet Take 1 tablet (180 mg total) by mouth 2 (two) times daily. 60 tablet 6   No current facility-administered medications for this encounter.    ECOG PERFORMANCE STATUS:  1 - Symptomatic but completely ambulatory  REVIEW OF SYSTEMS: Except for the lower urinary tract symptoms Patient denies any weight loss, fatigue, weakness, fever, chills or night sweats. Patient denies any loss of vision, blurred vision. Patient denies any ringing  of the ears or hearing loss. No irregular heartbeat. Patient denies heart murmur or history of fainting. Patient denies any chest pain or pain radiating to her upper extremities. Patient denies any shortness of breath, difficulty breathing at night, cough or hemoptysis. Patient denies any swelling in the lower legs. Patient denies any nausea vomiting, vomiting of blood, or coffee ground material in the vomitus. Patient denies any stomach pain. Patient states has had normal bowel movements no significant constipation or diarrhea. Patient denies  any dysuria, hematuria or significant nocturia. Patient denies any problems walking, swelling in the joints or loss of balance. Patient denies any skin changes, loss of hair or loss of weight. Patient denies any excessive worrying or anxiety or significant depression. Patient denies any problems with insomnia. Patient denies excessive thirst, polyuria, polydipsia. Patient denies any swollen glands, patient denies easy bruising or easy bleeding. Patient denies any recent infections, allergies or URI. Patient "s visual fields have not changed significantly in recent time.    PHYSICAL EXAM: BP 119/80 mmHg  Pulse 79  Temp(Src) 96.8 F (36 C)  Resp 18 On rectal exam rectal sphincter tone is good prostate is enlarged smooth somewhat firm throughout although no discrete nodularity is noted. Sulcus is preserved bilaterally. No other rectal abnormalities identified. Well-developed well-nourished patient in NAD. HEENT reveals PERLA, EOMI, discs not visualized.  Oral cavity is clear. No oral mucosal lesions are identified. Neck is clear without evidence of cervical or supraclavicular adenopathy. Lungs are clear to A&P. Cardiac examination is essentially unremarkable with regular rate and rhythm without murmur rub or thrill. Abdomen is benign with no organomegaly or masses noted. Motor sensory and DTR levels are equal and symmetric in the upper and lower extremities. Cranial nerves II through XII are grossly intact. Proprioception is intact. No peripheral  adenopathy or edema is identified. No motor or sensory levels are noted. Crude visual fields are within normal range.  LABORATORY DATA: Pathology reports reviewed    RADIOLOGY RESULTS: Bone scan and CT scan have been requested for my review   IMPRESSION: Stage IIa adenocarcinoma the prostate in 70 year old male  PLAN: At this time based on his borderline large size of his prostate gland which makes seed implantation difficult his multiple medical  comorbidities which would make radical prostatectomy difficult I would recommend is my first choice image guidedIMRT radiation therapy. I will plan on delivering 8000 cGy over 8 weeks to his prostate using image guided technique. I have asked urology to place gold fiduciary markers for daily image guided treatment. Risks and benefits of treatment including increased in lower urinary tract symptoms possible diarrhea, possible fatigue skin reaction all were described in detail to the patient. He seems to comprehend my treatment plan well. I have set him up and all personally ordered CT simulation shortly after his markers are placed. Both he and his wife seem to comprehend my treatment plan well.  I would like to take this opportunity for allowing me to participate in the care of your patient.Armstead Peaks., MD

## 2016-01-01 ENCOUNTER — Emergency Department: Payer: Medicare Other

## 2016-01-01 ENCOUNTER — Encounter: Payer: Self-pay | Admitting: Urgent Care

## 2016-01-01 ENCOUNTER — Emergency Department
Admission: EM | Admit: 2016-01-01 | Discharge: 2016-01-01 | Disposition: A | Discharge status: Home / Self Care | Payer: Medicare Other | Attending: Emergency Medicine | Admitting: Emergency Medicine

## 2016-01-01 DIAGNOSIS — I10 Essential (primary) hypertension: Secondary | ICD-10-CM | POA: Insufficient documentation

## 2016-01-01 DIAGNOSIS — Z87891 Personal history of nicotine dependence: Secondary | ICD-10-CM | POA: Diagnosis not present

## 2016-01-01 DIAGNOSIS — S299XXA Unspecified injury of thorax, initial encounter: Secondary | ICD-10-CM | POA: Diagnosis not present

## 2016-01-01 DIAGNOSIS — R42 Dizziness and giddiness: Secondary | ICD-10-CM | POA: Insufficient documentation

## 2016-01-01 DIAGNOSIS — Z7982 Long term (current) use of aspirin: Secondary | ICD-10-CM | POA: Insufficient documentation

## 2016-01-01 DIAGNOSIS — R102 Pelvic and perineal pain: Secondary | ICD-10-CM | POA: Diagnosis not present

## 2016-01-01 DIAGNOSIS — Z792 Long term (current) use of antibiotics: Secondary | ICD-10-CM | POA: Diagnosis not present

## 2016-01-01 DIAGNOSIS — Z8673 Personal history of transient ischemic attack (TIA), and cerebral infarction without residual deficits: Secondary | ICD-10-CM | POA: Insufficient documentation

## 2016-01-01 DIAGNOSIS — E785 Hyperlipidemia, unspecified: Secondary | ICD-10-CM | POA: Diagnosis not present

## 2016-01-01 DIAGNOSIS — Z79899 Other long term (current) drug therapy: Secondary | ICD-10-CM | POA: Diagnosis not present

## 2016-01-01 DIAGNOSIS — S0990XA Unspecified injury of head, initial encounter: Secondary | ICD-10-CM | POA: Diagnosis not present

## 2016-01-01 DIAGNOSIS — R531 Weakness: Secondary | ICD-10-CM | POA: Insufficient documentation

## 2016-01-01 DIAGNOSIS — R0781 Pleurodynia: Secondary | ICD-10-CM | POA: Diagnosis not present

## 2016-01-01 DIAGNOSIS — Z8546 Personal history of malignant neoplasm of prostate: Secondary | ICD-10-CM | POA: Diagnosis not present

## 2016-01-01 DIAGNOSIS — Z7401 Bed confinement status: Secondary | ICD-10-CM | POA: Diagnosis not present

## 2016-01-01 DIAGNOSIS — W19XXXA Unspecified fall, initial encounter: Secondary | ICD-10-CM | POA: Diagnosis not present

## 2016-01-01 DIAGNOSIS — J449 Chronic obstructive pulmonary disease, unspecified: Secondary | ICD-10-CM | POA: Insufficient documentation

## 2016-01-01 HISTORY — DX: Malignant neoplasm of prostate: C61

## 2016-01-01 LAB — URINALYSIS COMPLETE WITH MICROSCOPIC (ARMC ONLY)
Bacteria, UA: NONE SEEN
Bilirubin Urine: NEGATIVE
Glucose, UA: NEGATIVE mg/dL
Hgb urine dipstick: NEGATIVE
Nitrite: NEGATIVE
Protein, ur: NEGATIVE mg/dL
Specific Gravity, Urine: 1.025 (ref 1.005–1.030)
Squamous Epithelial / LPF: NONE SEEN
pH: 5 (ref 5.0–8.0)

## 2016-01-01 LAB — BASIC METABOLIC PANEL
Anion gap: 8 (ref 5–15)
BUN: 22 mg/dL — ABNORMAL HIGH (ref 6–20)
CO2: 23 mmol/L (ref 22–32)
Calcium: 8.6 mg/dL — ABNORMAL LOW (ref 8.9–10.3)
Chloride: 107 mmol/L (ref 101–111)
Creatinine, Ser: 0.86 mg/dL (ref 0.61–1.24)
GFR calc Af Amer: >60 mL/min (ref >60)
GFR calc non Af Amer: >60 mL/min (ref >60)
Glucose, Bld: 129 mg/dL — ABNORMAL HIGH (ref 65–99)
Potassium: 4.3 mmol/L (ref 3.5–5.1)
Sodium: 138 mmol/L (ref 135–145)

## 2016-01-01 LAB — TROPONIN I: Troponin I: <0.03 ng/mL (ref <0.031)

## 2016-01-01 LAB — CBC
HCT: 40.8 % (ref 40.0–52.0)
Hemoglobin: 13.6 g/dL (ref 13.0–18.0)
MCH: 29.2 pg (ref 26.0–34.0)
MCHC: 33.4 g/dL (ref 32.0–36.0)
MCV: 87.4 fL (ref 80.0–100.0)
Platelets: 227 10*3/uL (ref 150–440)
RBC: 4.67 MIL/uL (ref 4.40–5.90)
RDW: 16.1 % — ABNORMAL HIGH (ref 11.5–14.5)
WBC: 10.3 10*3/uL (ref 3.8–10.6)

## 2016-01-01 MED ORDER — DIATRIZOATE MEGLUMINE & SODIUM 66-10 % PO SOLN
15.0000 mL | Freq: Once | ORAL | Status: AC
  Administered 2016-01-01: 15 mL via ORAL

## 2016-01-01 MED ORDER — CEFTRIAXONE SODIUM 1 G IJ SOLR
1.0000 g | Freq: Once | INTRAMUSCULAR | Status: DC
  Filled 2016-01-01: qty 10

## 2016-01-01 MED ORDER — IOPAMIDOL (ISOVUE-300) INJECTION 61%
100.0000 mL | Freq: Once | INTRAVENOUS | Status: AC | PRN
  Administered 2016-01-01: 100 mL via INTRAVENOUS

## 2016-01-01 MED ORDER — SODIUM CHLORIDE 0.9 % IV BOLUS (SEPSIS)
500.0000 mL | Freq: Once | INTRAVENOUS | Status: AC
  Administered 2016-01-01: 500 mL via INTRAVENOUS

## 2016-01-01 MED ORDER — SODIUM CHLORIDE 0.9 % IV SOLN
Freq: Once | INTRAVENOUS | Status: AC
  Administered 2016-01-01: 09:00:00 via INTRAVENOUS

## 2016-01-01 NOTE — Discharge Instructions (Signed)

## 2016-01-01 NOTE — ED Provider Notes (Signed)
[facility] Emergency Department Provider Note  ____________________________________________  Time seen: 5:50 AM  I have reviewed the triage vital signs and the nursing notes.   HISTORY  Chief Complaint Weakness    HPI Howard Anderson is a 70 y.o. male with history of prostate cancer AAA COPD presents via EMS from home with generalize weakness chills, urinary frequency and urgency 1 day. Patient states on arriving home yesterday he felt generalized weakness and a such had difficulty getting out of the car. Patient stated then he says "had difficulty getting out of a chair this morning and a such slipped to the floor no head injury. Patient's spouse at bedside states that the patient's urine has been malodorous     Past Medical History  Diagnosis Date  . Hypertension   . Heart disease   . AAA (abdominal aortic aneurysm) without rupture ([place])   . Chest pain   . Shoulder fracture, right   . COPD (chronic obstructive pulmonary disease) ([place])   . Dizziness and giddiness   . Stroke ([place])     tia  . Shortness of breath dyspnea   . AAA (abdominal aortic aneurysm) without rupture ([place])   . GERD (gastroesophageal reflux disease)   . Hyperlipidemia   . Cancer [facility])     prostate cancer  . Prostate cancer [facility])     Patient Active Problem List   Diagnosis Date Noted  . Essential hypertension 10/29/2015  . Hyperlipidemia 10/29/2015  . Esophageal reflux 10/29/2015  . Centrilobular emphysema ([place]) 10/29/2015  . Labyrinthitis 10/29/2015  . Femoral artery aneurysm, left ([place]) 06/22/2015  . Femoral artery pseudo-aneurysm, left ([place]) 06/21/2015  . AAA (abdominal aortic aneurysm) ([place]) 05/21/2015  . Degenerative disc disease, lumbar 04/24/2015  . Urinary incontinence, male, stress 04/24/2015    Past Surgical History  Procedure Laterality Date  . Colonoscopy  2010  . Inner ear surgery    . Peripheral vascular catheterization N/A 05/21/2015    Procedure:  Endovascular Repair/Stent Graft;  Surgeon: [name], MD;  Location: [place] [facility];  Service: Cardiovascular;  Laterality: N/A;  . Peripheral vascular catheterization  06/20/2015    Procedure: Lower Extremity Angiography;  Surgeon: [name], MD;  Location: [place] [facility];  Service: Cardiovascular;;  . Peripheral vascular catheterization  06/20/2015    Procedure: Lower Extremity Intervention;  Surgeon: [name], MD;  Location: [place] [facility];  Service: Cardiovascular;;  . Embolectomy Left 06/20/2015    Procedure: EMBOLECTOMY;  Surgeon: [name], MD;  Location: [facility];  Service: Vascular;  Laterality: Left;  brachial thrombectomy  . Endarterectomy femoral Left 06/20/2015    Procedure: ENDARTERECTOMY FEMORAL/FEMORAL PSEUDOANEURYSM repair;  Surgeon: [name], MD;  Location: [facility];  Service: Vascular;  Laterality: Left;  femerol    Current Outpatient Rx  Name  Route  Sig  Dispense  Refill  . aspirin 81 MG tablet   Oral   Take 1 tablet (81 mg total) by mouth daily.   30 tablet   11   . atorvastatin (LIPITOR) 10 MG tablet   Oral   Take 1 tablet (10 mg total) by mouth daily.   30 tablet   6   . Cholecalciferol (VITAMIN D3) 1000 UNITS CAPS   Oral   Take 1 capsule by mouth 2 (two) times daily.         [name] doxycycline (PERIOSTAT) 20 MG tablet   Oral   Take 20 mg by mouth 2 (  two) times daily.         . Fluticasone-Salmeterol (ADVAIR) 250-50 MCG/DOSE AEPB   Inhalation   Inhale 1 puff into the lungs 2 (two) times daily.   60 each   6   . latanoprost (XALATAN) 0.005 % ophthalmic solution      1 drop at bedtime.          . meclizine (ANTIVERT) 25 MG tablet   Oral   Take 1 tablet (25 mg total) by mouth 3 (three) times daily as needed for dizziness.   30 tablet   6   . Misc Natural Products (CYSTEX) LIQD   Oral   Take 1 Dose by mouth.         . Multiple Vitamins-Minerals (PRESERVISION AREDS PO)   Oral    Take 1 capsule by mouth 2 (two) times daily.         [name] omega-3 acid ethyl esters (LOVAZA) 1 g capsule   Oral   Take 1 capsule (1 g total) by mouth 2 (two) times daily.   60 capsule   6   . Propylene Glycol (SYSTANE BALANCE) 0.6 % SOLN   Ophthalmic   Apply 1 drop to eye 2 (two) times daily.         . ranitidine (ZANTAC) 150 MG tablet   Oral   Take 0.5 tablets (75 mg total) by mouth daily.   30 tablet   6     Last time filling- needs fasting med refill appt   . Tiotropium Bromide Monohydrate (SPIRIVA RESPIMAT) 2.5 MCG/ACT AERS      INHALE 1 INHALATION DAILY   1 Inhaler   6   . verapamil (CALAN-SR) 180 MG CR tablet   Oral   Take 1 tablet (180 mg total) by mouth 2 (two) times daily.   60 tablet   6     Allergies No known drug allergies No family history on file.  Social History Social History  Substance Use Topics  . Smoking status: Former Smoker -- 1.50 packs/day for 48 years    Quit date: 10/10/2013  . Smokeless tobacco: Never Used  . Alcohol Use: No    Review of Systems  Constitutional: Negative for fever. Eyes: Negative for visual changes. ENT: Negative for sore throat. Cardiovascular: Negative for chest pain. Respiratory: Negative for shortness of breath. Gastrointestinal: Negative for abdominal pain, vomiting and diarrhea. Genitourinary: Negative for dysuria. Musculoskeletal: Negative for back pain. Skin: Negative for rash. Neurological: Negative for headaches, focal weakness or numbness.  10-point ROS otherwise negative.  ____________________________________________   PHYSICAL EXAM:  VITAL SIGNS: ED Triage Vitals  Enc Vitals Group     BP 01/01/16 0532 129/86 mmHg     Pulse Rate 01/01/16 0532 94     Resp 01/01/16 0532 20     Temp 01/01/16 0532 99.4 F (37.4 C)     Temp Source 01/01/16 0532 Oral     SpO2 01/01/16 0532 94 %     Weight 01/01/16 0532 210 lb (95.255 kg)     Height 01/01/16 0532 5\' 7"  (1.702 m)     Head Cir --      Peak  Flow --      Pain Score 01/01/16 0534 0     Pain Loc --      Pain Edu? --      Excl. in [place]? --     Constitutional: Alert and oriented. Well appearing and in no distress. Eyes: Conjunctivae are normal. PERRL. Normal  extraocular movements. ENT   Head: Normocephalic and atraumatic.   Nose: No congestion/rhinnorhea.   Mouth/Throat: Mucous membranes are moist.   Neck: No stridor. Hematological/Lymphatic/Immunilogical: No cervical lymphadenopathy. Cardiovascular: Normal rate, regular rhythm. Normal and symmetric distal pulses are present in all extremities. No murmurs, rubs, or gallops. Respiratory: Normal respiratory effort without tachypnea nor retractions. Breath sounds are clear and equal bilaterally. No wheezes/rales/rhonchi. Gastrointestinal: Soft and nontender. No distention. There is no CVA tenderness. Genitourinary: deferred Musculoskeletal: Nontender with normal range of motion in all extremities. No joint effusions.  No lower extremity tenderness nor edema. Neurologic:  Normal speech and language. No gross focal neurologic deficits are appreciated. Speech is normal.  Skin:  Skin is warm, dry and intact. No rash noted. Psychiatric: Mood and affect are normal. Speech and behavior are normal. Patient exhibits appropriate insight and judgment.  ____________________________________________    LABS (pertinent positives/negatives)  Labs Reviewed  URINE CULTURE  BASIC METABOLIC PANEL  CBC  URINALYSIS COMPLETEWITH MICROSCOPIC ([place] ONLY)  TROPONIN I     ____________________________________________   EKG    ____________________________________________    RADIOLOGY  CT Abdomen Pelvis W Contrast (Final result) Result time: 01/01/16 08:23:52   Final result by Rad Results In Interface (01/01/16 08:23:52)   Narrative:   CLINICAL DATA: Pelvic pain, fever, chills.  EXAM: CT ABDOMEN AND PELVIS WITH CONTRAST  TECHNIQUE: Multidetector CT imaging of the  abdomen and pelvis was performed using the standard protocol following bolus administration of intravenous contrast.  CONTRAST: 133mL ISOVUE-300 IOPAMIDOL (ISOVUE-300) INJECTION 61%  COMPARISON: CT scan of April 18, 2015.  FINDINGS: Minimal right pleural effusion is noted with adjacent subsegmental atelectasis. No significant osseous abnormality is noted.  No gallstones are noted. Small left hepatic cyst is noted. Otherwise, the liver, spleen and pancreas are unremarkable. Adrenal glands appear normal. Stable small bilateral renal cysts are noted. No hydronephrosis or renal obstruction is noted. No renal or ureteral calculi are noted. Status post endo graft repair of infrarenal abdominal aortic aneurysm. Aneurysmal sac has maximum measured diameter of 4.1 cm. Graft and limbs are widely patent without evidence of endoleak. The appendix appears normal. There is no evidence of bowel obstruction. No abnormal fluid collection is noted. Urinary bladder appears normal. Stool is noted throughout the colon. No significant adenopathy is noted. Left groin surgical wound is noted.  IMPRESSION: Minimal right pleural effusion with adjacent subsegmental atelectasis.  Status post endo graft repair of infrarenal abdominal aortic aneurysm.  No acute abnormality seen in the abdomen or pelvis.   Electronically Signed By: Marijo Conception, M.D. On: 01/01/2016 08:23          CT Head Wo Contrast (Final result) Result time: 01/01/16 06:51:53   Final result by Rad Results In Interface (01/01/16 06:51:53)   Narrative:   CLINICAL DATA: Bilateral lower extremity weakness. Recent fall.  EXAM: CT HEAD WITHOUT CONTRAST  TECHNIQUE: Contiguous axial images were obtained from the base of the skull through the vertex without intravenous contrast.  COMPARISON: Head CT 07/27/2014  FINDINGS: Brain: Unchanged generalized atrophy and chronic small vessel ischemia. Unchanged  ventricular dilatation likely secondary to atrophy. Small remote lacunar infarct in the right basal ganglia. No intracranial hemorrhage, mass effect, or midline shift. No hydrocephalus. The basilar cisterns are patent. No evidence of territorial infarct. No intracranial fluid collection.  Vascular: No hyperdense vessel. Unchanged tortuosity and atherosclerosis of skullbase vasculature. Unchanged calcified atherosclerosis of MCA branches on the right.  Skull: Calvarium is intact.  Sinuses/Orbits: Chronic opacification of right  mastoid air cells. Left mastoid air cells are clear. Paranasal sinuses are well-aerated.  Other: None.  IMPRESSION: 1. No acute intracranial abnormality. 2. Stable atrophy and chronic small vessel ischemia.   Electronically Signed By: Jeb Levering M.D. On: 01/01/2016 06:51          DG Chest Portable 1 View (Final result) Result time: 01/01/16 06:27:35   Final result by Rad Results In Interface (01/01/16 06:27:35)   Narrative:   CLINICAL DATA: Dyspnea. Right rib pain after fall.  EXAM: PORTABLE CHEST 1 VIEW  COMPARISON: Radiographs 05/08/2015  FINDINGS: Cardiomediastinal contours are unchanged with stable mild cardiomegaly. Mild elevation of right hemidiaphragm. Linear bibasilar opacities likely atelectasis or scarring. No definite pulmonary edema. No large pleural effusion. No pneumothorax. Fractures of the 4th and 5th right ribs, remote based on radiograph 05/08/2015. No acute rib fracture is seen.  IMPRESSION: Linear bibasilar opacities are likely atelectasis or scarring. No additional acute abnormality. No pneumothorax.   Electronically Signed By: Jeb Levering M.D. On: 01/01/2016 06:27      INITIAL IMPRESSION / ASSESSMENT AND PLAN / ED COURSE  Pertinent labs & imaging results that were available during my care of the patient were reviewed by me and considered in my medical decision making (see chart for  details).  Patient's urinalysis did not reveal a urinary tract infection however urine culture obtained even history of dysuria and malodorous urine. Patient's care transferred to Dr. Jimmye Norman  ____________________________________________   FINAL CLINICAL IMPRESSION(S) / ED DIAGNOSES  Final diagnoses:  Weakness      Gregor Hams, MD 01/07/16 [phone]

## 2016-01-01 NOTE — ED Notes (Signed)
MD with [facility] to hold ordered Rocephin dose at this time; no elevated WBC or UA findings consistent with infection.

## 2016-01-01 NOTE — ED Notes (Signed)
PT at bedside for evaluation.

## 2016-01-01 NOTE — Evaluation (Addendum)
Physical Therapy Evaluation Patient Details Name: Howard Anderson MRN: [id number] DOB: [birth year] Today's Date: 01/01/2016   History of Present Illness  70 yo M presented to ED with difficulty getting out of car and ambulating with increased weakness. He was recently diagnosed with prostate cancer. PMH includes TIA, SOB, AAA s/p stent graft in 2016.  Clinical Impression  Pt demonstrated generalized weakness and difficulty walking. He presents with a flat affect with monotone voice. For bed mobility he requires max A +1 to mod A +2 due to decreased trunk stability/strength. During transfers and ambulation up to 60 ft with FWW pt requires mod A to min A +2. Pt demonstrated significant abnormalities of gait including shuffling/almost festinating gait, forward flexed propulsion-like mobility as is he can't control or coordinate his movements well. Pt tended to freeze when faced with obstacles and is easily distracted. RN notified of findings and PT expressed concern for need of further evaluation. Pt would not be safe to return home in this functional condition. STR is recommended to address deficits of strength, balance, transfers, gait and coordination to improve functional independence. Pt will benefit from skilled PT services to increase functional I and mobility for safe discharge.     Follow Up Recommendations SNF    Equipment Recommendations  Rolling walker with 5" wheels    Recommendations for Other Services       Precautions / Restrictions Precautions Precautions: Fall Restrictions Weight Bearing Restrictions: No      Mobility  Bed Mobility Overal bed mobility: Needs Assistance Bed Mobility: Supine to Sit;Sit to Supine     Supine to sit: Max assist;HOB elevated Sit to supine: Mod assist;+2 for physical assistance   General bed mobility comments: very difficult to get trunk upright  Transfers Overall transfer level: Needs assistance Equipment used: Rolling walker (2  wheeled) Transfers: Sit to/from [facility] Sit to Stand: Mod assist Stand pivot transfers: Min assist       General transfer comment: pt with difficulty shifting weight anteriorly and initiating movement  Ambulation/Gait Ambulation/Gait assistance: Min assist;+2 safety/equipment Ambulation Distance (Feet): 60 Feet Assistive device: Rolling walker (2 wheeled) Gait Pattern/deviations: Step-to pattern;Shuffle;Drifts right/left;Trunk flexed Gait velocity: reduced Gait velocity interpretation: Below normal speed for age/gender General Gait Details: Pt slow and unsteady. Demonstrated shuffling gait and forward flexed posture as if he doesn't have control of his LEs. Easily distracted and freezes upon obstacles.  Stairs            Wheelchair Mobility    Modified Rankin (Stroke Patients Only)       Balance Overall balance assessment: Needs assistance;History of Falls Sitting-balance support: Bilateral upper extremity supported;Feet supported Sitting balance-Leahy Scale: Fair     Standing balance support: Bilateral upper extremity supported Standing balance-Leahy Scale: Fair Standing balance comment: unsteady, poor posture                             Pertinent Vitals/Pain Pain Assessment: No/denies pain    Home Living Family/patient expects to be discharged to:: Private residence Living Arrangements: Spouse/significant other Available Help at Discharge: Family Type of Home: House Home Access: Stairs to enter Entrance Stairs-Rails: Can reach both Entrance Stairs-Number of Steps: 3 Home Layout: One level Home Equipment: Cane - single point      Prior Function Level of Independence: Independent         Comments: Previously pt was I with basic ADLs, ambulation without AD, driving. Per further questioning,  pt has been having a decline in amout of walking and activity level for a while now, but it comes and goes.     Hand Dominance         Extremity/Trunk Assessment   Upper Extremity Assessment: Generalized weakness           Lower Extremity Assessment: Generalized weakness (grossly 4-/5)      Cervical / Trunk Assessment: Kyphotic  Communication   Communication: No difficulties  Cognition Arousal/Alertness: Awake/alert Behavior During Therapy: Flat affect (monotone, soft spoken) Overall Cognitive Status: Within Functional Limits for tasks assessed                      General Comments General comments (skin integrity, edema, etc.): B LE edema, difficulty initiating movements    Exercises Other Exercises Other Exercises: Pt ambulated 60 ft with FWW and min A +2. Demonstrated significant shuffling pattern, forward leaning propulsion as if he can't control his movements, and freezes when faced with an obstacle. Cues for staying inside FWW, lifint feet and increased step length with little ability to correct.      Assessment/Plan    PT Assessment Patient needs continued PT services  PT Diagnosis Difficulty walking;Generalized weakness   PT Problem List Decreased strength;Decreased activity tolerance;Decreased balance;Decreased mobility;Decreased coordination;Decreased knowledge of use of DME;Decreased safety awareness  PT Treatment Interventions DME instruction;Gait training;Stair training;Therapeutic activities;Therapeutic exercise;Balance training;Neuromuscular re-education;Patient/family education   PT Goals (Current goals can be found in the Care Plan section) Acute Rehab PT Goals Patient Stated Goal: to walk better PT Goal Formulation: With patient/family Time For Goal Achievement: 01/15/16 Potential to Achieve Goals: Fair    Frequency Min 2X/week   Barriers to discharge Inaccessible home environment;Decreased caregiver support steps to enter, wife wouldn't be able to care for him at this level of function    Co-evaluation               End of Session Equipment Utilized During  Treatment: Gait belt Activity Tolerance: Patient tolerated treatment well Patient left: in bed;with call bell/phone within reach;with family/visitor present Nurse Communication: Mobility status;Other (comment) (neurological signs)    Functional Assessment Tool Used: clinical judgement, gait speed Functional Limitation: Mobility: Walking and moving around Mobility: Walking and Moving Around Current Status [phone]): At least 40 percent but less than 60 percent impaired, limited or restricted Mobility: Walking and Moving Around Goal Status [phone]): At least 1 percent but less than 20 percent impaired, limited or restricted    Time: 1325-1350 PT Time Calculation (min) (ACUTE ONLY): 25 min   Charges:   PT Evaluation $PT Eval Moderate Complexity: 1 Procedure PT Treatments $Gait Training: 8-22 mins   PT G Codes:   PT G-Codes **NOT FOR INPATIENT CLASS** Functional Assessment Tool Used: clinical judgement, gait speed Functional Limitation: Mobility: Walking and moving around Mobility: Walking and Moving Around Current Status [id number]): At least 40 percent but less than 60 percent impaired, limited or restricted Mobility: Walking and Moving Around Goal Status [phone]): At least 1 percent but less than 20 percent impaired, limited or restricted    Neoma Laming, PT, DPT  01/01/2016, 2:26 PM [phone]

## 2016-01-01 NOTE — ED Notes (Signed)
MD with [facility] to discontinue Rocephin order; to be discontinued in [facility] by this RN.

## 2016-01-01 NOTE — ED Notes (Signed)
Patient presents to ED via EMS from home. Patient reported that he had difficulty getting out of the car last night. Patient was able to get into the house and into his recliner. Patient reported to have attempted to get out of [facility] this morning and "slid to the floor". EMS arrived and attempted to stand patient - he was able to bear weight, however could not ambulate citing the fact that he was "too weak".  Patient recently diagnosed with prostate cancer.

## 2016-01-01 NOTE — Care Management Note (Signed)
Case Management Note  Patient Details  Name: Howard Anderson MRN: [id number] Date of Birth: 1945-09-10  Subjective/Objective: Spoke to patient spouse at bedside. She has seen and spoken to the CSW. She is aware that Medicare will not cover for the patient to go to [facility] unless a 3 day Inpatient stay is needed for the patient. When asked about why she wants LC she states that the patient was there before, and they treated him good. I have re-iterated the information from the CSW about perhaps paying out of pocket, and the spouse has again said that she does not have the resources, and when I tried to  Give her [facility] and personal care resources she has declined saying that this would mean the patient was still at home. This is the same response when I tried to give her info about [facility].  She is adamant she wants the patient out of the home. So I have reinforced that PT is coming to assess her spouse, but that without the IP stay, it would be an out of pocket expense.                   Action/Plan:   Expected Discharge Date:                  Expected Discharge Plan:     In-House Referral:     Discharge planning Services     Post Acute Care Choice:    Choice offered to:     DME Arranged:    DME Agency:     HH Arranged:    [place] Agency:     Status of Service:     Medicare Important Message Given:    Date Medicare IM Given:    Medicare IM give by:    Date Additional Medicare IM Given:    Additional Medicare Important Message give by:     If discussed at [place] of Stay Meetings, dates discussed:    Additional Comments:  Beau Fanny, RN 01/01/2016, 11:59 AM

## 2016-01-01 NOTE — ED Notes (Signed)
Blood drawn and sent, lavender, light green, red, and light blue tops.

## 2016-01-01 NOTE — Clinical Social Work Note (Signed)
Clinical Social Work Assessment  Patient Details  Name: Howard Anderson MRN: [id number] Date of Birth: [birth year]  Date of referral:  01/01/16               Reason for consult:  Discharge Planning                Permission sought to share information with:  Family Supports Permission granted to share information::  Yes, Verbal Permission Granted  Name::        Agency::     Relationship::   (Wife)  Contact Information:     Housing/Transportation Living arrangements for the past 2 months:  [place] of Information:  Patient, Spouse Patient Interpreter Needed:  None Criminal Activity/Legal Involvement Pertinent to Current Situation/Hospitalization:  No - Comment as needed Significant Relationships:  Spouse Lives with:  Spouse Do you feel safe going back to the place where you live?  Yes Need for family participation in patient care:  Yes (Comment)  Care giving concerns:  Patient's wife reports that patient cannot walk and she cannot care for him at home.    Social Worker assessment / plan:  CSW received a consult stating patient cannot walk. CSW met with patient and his wife at bedside. Explained her role. Per patient's wife patient isn't able to walk. She reports that she feels he needs SNF placement. CSW explained Medicare requires patient's to have a 3 midnight qualifying stay as inpatient in order to be eligible for his SNF benefit. CSW explained that patient cannot go to SNF under his Medicare benefit and asked if they can pay privately. Explained that praivate pay for SNF ranges from $6,000- 8,000 per month. Patient and his wife reports they cannot afford that. CSW suggested [facility] services. Per patient's wife patient has had [facility] services in the past. Stated that they did not find the beneficial and would like patient to go to [facility]. Stating he's been there in the past. CSW explained to patient and his wife Medicare SNF benefit and discussed patient's options.  She stated she'd like SNF but would think about [facility] services. CSW informed RNCM of above. CSW is signing off due to patient not being eligible for SNF under his Medicare Benefits and not being able to pay privately for SNF placement. CSW is available if a CSW need were to arise.   Employment status:  Retired Forensic scientist:  Medicare PT Recommendations:  [place] / Referral to community resources:  [place]  Patient/Family's Response to care:  Patient and his family were not pleased with the explanation of Medicare benefits. Stated that patient needs assistance with walking and feels that he should be inpatient. CSW explained to patient that Medicare has certain requirements for Inpatient vs Outpatient and who's admitted into the hospital. Reported they did not agree with it.   Patient/Family's Understanding of and Emotional Response to Diagnosis, Current Treatment, and Prognosis:  Patient and his wife was not happy with information provided by CSW. Stated that more should be done to assist patient.   Emotional Assessment Appearance:  Appears stated age Attitude/Demeanor/Rapport:  Lethargic Affect (typically observed):  Calm Orientation:  Oriented to Self, Oriented to Place, Oriented to  Time, Oriented to Situation Alcohol / Substance use:  Not Applicable Psych involvement (Current and /or in the community):  No (Comment)  Discharge Needs  Concerns to be addressed:  Discharge Planning Concerns Readmission within the last 30 days:  No Current discharge risk:  None Barriers to Discharge:  No Barriers Identified   [place], LCSW 01/01/2016, 2:24 PM

## 2016-01-01 NOTE — ED Provider Notes (Signed)
IMPRESSION: Minimal right pleural effusion with adjacent subsegmental atelectasis.  Status post endo graft repair of infrarenal abdominal aortic aneurysm.  No acute abnormality seen in the abdomen or pelvis.   Patient remained weak throughout his ER stay. He was evaluated by physical therapy and we attempted to arrange home PT for the patient. The wife of the patient declined home PT. He does not meet criteria for admission. We have discussed having his primary care Dr. Venida Jarvis out an [place] to form for possible skilled rehabilitation placement. He is stable to return home for now. Weakness has been progressive for some time now.  Earleen Newport, MD 01/01/16 [phone]

## 2016-01-01 NOTE — ED Notes (Signed)
Patient to radiology at this time for MD ordered CT scan of his head.

## 2016-01-02 ENCOUNTER — Encounter: Payer: Self-pay | Admitting: Vascular Surgery

## 2016-01-02 ENCOUNTER — Other Ambulatory Visit: Payer: Self-pay

## 2016-01-03 LAB — URINE CULTURE: Culture: 20000 — AB

## 2016-01-05 ENCOUNTER — Ambulatory Visit: Payer: Medicare Other

## 2016-01-05 ENCOUNTER — Ambulatory Visit
Admission: RE | Admit: 2016-01-05 | Discharge: 2016-01-05 | Disposition: A | Discharge status: Home / Self Care | Payer: Medicare Other | Source: Ambulatory Visit | Attending: Radiation Oncology | Admitting: Radiation Oncology

## 2016-01-13 DIAGNOSIS — C61 Malignant neoplasm of prostate: Secondary | ICD-10-CM | POA: Diagnosis not present

## 2016-01-15 ENCOUNTER — Encounter: Payer: Self-pay | Admitting: *Deleted

## 2016-01-15 ENCOUNTER — Other Ambulatory Visit: Payer: Self-pay | Admitting: *Deleted

## 2016-01-15 ENCOUNTER — Ambulatory Visit
Admission: RE | Admit: 2016-01-15 | Discharge: 2016-01-15 | Disposition: A | Discharge status: Home / Self Care | Payer: Medicare Other | Source: Ambulatory Visit | Attending: Radiation Oncology | Admitting: Radiation Oncology

## 2016-01-15 DIAGNOSIS — C61 Malignant neoplasm of prostate: Secondary | ICD-10-CM | POA: Diagnosis not present

## 2016-01-15 DIAGNOSIS — Z510 Encounter for antineoplastic radiation therapy: Secondary | ICD-10-CM | POA: Diagnosis not present

## 2016-01-16 DIAGNOSIS — Z510 Encounter for antineoplastic radiation therapy: Secondary | ICD-10-CM | POA: Diagnosis not present

## 2016-01-16 DIAGNOSIS — C61 Malignant neoplasm of prostate: Secondary | ICD-10-CM | POA: Diagnosis not present

## 2016-01-21 DIAGNOSIS — H8111 Benign paroxysmal vertigo, right ear: Secondary | ICD-10-CM | POA: Diagnosis not present

## 2016-01-21 DIAGNOSIS — R42 Dizziness and giddiness: Secondary | ICD-10-CM | POA: Diagnosis not present

## 2016-01-23 ENCOUNTER — Other Ambulatory Visit: Payer: Self-pay | Admitting: *Deleted

## 2016-01-23 DIAGNOSIS — C61 Malignant neoplasm of prostate: Secondary | ICD-10-CM | POA: Diagnosis not present

## 2016-01-23 DIAGNOSIS — Z510 Encounter for antineoplastic radiation therapy: Secondary | ICD-10-CM | POA: Diagnosis not present

## 2016-01-26 ENCOUNTER — Ambulatory Visit
Admission: RE | Admit: 2016-01-26 | Discharge: 2016-01-26 | Disposition: A | Discharge status: Home / Self Care | Payer: Medicare Other | Source: Ambulatory Visit | Attending: Radiation Oncology | Admitting: Radiation Oncology

## 2016-01-27 ENCOUNTER — Ambulatory Visit
Admission: RE | Admit: 2016-01-27 | Discharge: 2016-01-27 | Disposition: A | Discharge status: Home / Self Care | Payer: Medicare Other | Source: Ambulatory Visit | Attending: Radiation Oncology | Admitting: Radiation Oncology

## 2016-01-27 DIAGNOSIS — C61 Malignant neoplasm of prostate: Secondary | ICD-10-CM | POA: Diagnosis not present

## 2016-01-27 DIAGNOSIS — Z510 Encounter for antineoplastic radiation therapy: Secondary | ICD-10-CM | POA: Diagnosis not present

## 2016-01-28 ENCOUNTER — Ambulatory Visit
Admission: RE | Admit: 2016-01-28 | Discharge: 2016-01-28 | Disposition: A | Discharge status: Home / Self Care | Payer: Medicare Other | Source: Ambulatory Visit | Attending: Radiation Oncology | Admitting: Radiation Oncology

## 2016-01-28 DIAGNOSIS — Z510 Encounter for antineoplastic radiation therapy: Secondary | ICD-10-CM | POA: Diagnosis not present

## 2016-01-28 DIAGNOSIS — C61 Malignant neoplasm of prostate: Secondary | ICD-10-CM | POA: Diagnosis not present

## 2016-01-29 ENCOUNTER — Ambulatory Visit
Admission: RE | Admit: 2016-01-29 | Discharge: 2016-01-29 | Disposition: A | Discharge status: Home / Self Care | Payer: Medicare Other | Source: Ambulatory Visit | Attending: Radiation Oncology | Admitting: Radiation Oncology

## 2016-01-29 DIAGNOSIS — Z510 Encounter for antineoplastic radiation therapy: Secondary | ICD-10-CM | POA: Diagnosis not present

## 2016-01-29 DIAGNOSIS — C61 Malignant neoplasm of prostate: Secondary | ICD-10-CM | POA: Diagnosis not present

## 2016-01-30 ENCOUNTER — Ambulatory Visit
Admission: RE | Admit: 2016-01-30 | Discharge: 2016-01-30 | Disposition: A | Discharge status: Home / Self Care | Payer: Medicare Other | Source: Ambulatory Visit | Attending: Radiation Oncology | Admitting: Radiation Oncology

## 2016-01-30 DIAGNOSIS — C61 Malignant neoplasm of prostate: Secondary | ICD-10-CM | POA: Diagnosis not present

## 2016-01-30 DIAGNOSIS — Z510 Encounter for antineoplastic radiation therapy: Secondary | ICD-10-CM | POA: Diagnosis not present

## 2016-02-02 ENCOUNTER — Ambulatory Visit
Admission: RE | Admit: 2016-02-02 | Discharge: 2016-02-02 | Disposition: A | Discharge status: Home / Self Care | Payer: Medicare Other | Source: Ambulatory Visit | Attending: Radiation Oncology | Admitting: Radiation Oncology

## 2016-02-02 DIAGNOSIS — Z510 Encounter for antineoplastic radiation therapy: Secondary | ICD-10-CM | POA: Diagnosis not present

## 2016-02-02 DIAGNOSIS — C61 Malignant neoplasm of prostate: Secondary | ICD-10-CM | POA: Diagnosis not present

## 2016-02-04 ENCOUNTER — Ambulatory Visit
Admission: RE | Admit: 2016-02-04 | Discharge: 2016-02-04 | Disposition: A | Discharge status: Home / Self Care | Payer: Medicare Other | Source: Ambulatory Visit | Attending: Radiation Oncology | Admitting: Radiation Oncology

## 2016-02-04 DIAGNOSIS — C61 Malignant neoplasm of prostate: Secondary | ICD-10-CM | POA: Diagnosis not present

## 2016-02-04 DIAGNOSIS — Z510 Encounter for antineoplastic radiation therapy: Secondary | ICD-10-CM | POA: Diagnosis not present

## 2016-02-05 ENCOUNTER — Ambulatory Visit
Admission: RE | Admit: 2016-02-05 | Discharge: 2016-02-05 | Disposition: A | Discharge status: Home / Self Care | Payer: Medicare Other | Source: Ambulatory Visit | Attending: Radiation Oncology | Admitting: Radiation Oncology

## 2016-02-05 DIAGNOSIS — C61 Malignant neoplasm of prostate: Secondary | ICD-10-CM | POA: Diagnosis not present

## 2016-02-05 DIAGNOSIS — Z510 Encounter for antineoplastic radiation therapy: Secondary | ICD-10-CM | POA: Diagnosis not present

## 2016-02-06 ENCOUNTER — Ambulatory Visit
Admission: RE | Admit: 2016-02-06 | Discharge: 2016-02-06 | Disposition: A | Discharge status: Home / Self Care | Payer: Medicare Other | Source: Ambulatory Visit | Attending: Radiation Oncology | Admitting: Radiation Oncology

## 2016-02-06 DIAGNOSIS — Z510 Encounter for antineoplastic radiation therapy: Secondary | ICD-10-CM | POA: Diagnosis not present

## 2016-02-06 DIAGNOSIS — C61 Malignant neoplasm of prostate: Secondary | ICD-10-CM | POA: Diagnosis not present

## 2016-02-09 ENCOUNTER — Ambulatory Visit
Admission: RE | Admit: 2016-02-09 | Discharge: 2016-02-09 | Disposition: A | Discharge status: Home / Self Care | Payer: Medicare Other | Source: Ambulatory Visit | Attending: Radiation Oncology | Admitting: Radiation Oncology

## 2016-02-09 DIAGNOSIS — C61 Malignant neoplasm of prostate: Secondary | ICD-10-CM | POA: Diagnosis not present

## 2016-02-09 DIAGNOSIS — Z510 Encounter for antineoplastic radiation therapy: Secondary | ICD-10-CM | POA: Diagnosis not present

## 2016-02-10 ENCOUNTER — Ambulatory Visit
Admission: RE | Admit: 2016-02-10 | Discharge: 2016-02-10 | Disposition: A | Discharge status: Home / Self Care | Payer: Medicare Other | Source: Ambulatory Visit | Attending: Radiation Oncology | Admitting: Radiation Oncology

## 2016-02-10 ENCOUNTER — Inpatient Hospital Stay: Payer: Medicare Other | Attending: Radiation Oncology

## 2016-02-10 DIAGNOSIS — C61 Malignant neoplasm of prostate: Secondary | ICD-10-CM | POA: Diagnosis not present

## 2016-02-10 DIAGNOSIS — Z510 Encounter for antineoplastic radiation therapy: Secondary | ICD-10-CM | POA: Diagnosis not present

## 2016-02-10 LAB — CBC
HCT: 44.1 % (ref 40.0–52.0)
Hemoglobin: 14.7 g/dL (ref 13.0–18.0)
MCH: 29.4 pg (ref 26.0–34.0)
MCHC: 33.4 g/dL (ref 32.0–36.0)
MCV: 88 fL (ref 80.0–100.0)
Platelets: 289 10*3/uL (ref 150–440)
RBC: 5.01 MIL/uL (ref 4.40–5.90)
RDW: 14.6 % — ABNORMAL HIGH (ref 11.5–14.5)
WBC: 6.3 10*3/uL (ref 3.8–10.6)

## 2016-02-11 ENCOUNTER — Ambulatory Visit
Admission: RE | Admit: 2016-02-11 | Discharge: 2016-02-11 | Disposition: A | Discharge status: Home / Self Care | Payer: Medicare Other | Source: Ambulatory Visit | Attending: Radiation Oncology | Admitting: Radiation Oncology

## 2016-02-11 DIAGNOSIS — C61 Malignant neoplasm of prostate: Secondary | ICD-10-CM | POA: Diagnosis not present

## 2016-02-11 DIAGNOSIS — Z510 Encounter for antineoplastic radiation therapy: Secondary | ICD-10-CM | POA: Diagnosis not present

## 2016-02-12 ENCOUNTER — Ambulatory Visit
Admission: RE | Admit: 2016-02-12 | Discharge: 2016-02-12 | Disposition: A | Discharge status: Home / Self Care | Payer: Medicare Other | Source: Ambulatory Visit | Attending: Radiation Oncology | Admitting: Radiation Oncology

## 2016-02-12 DIAGNOSIS — C61 Malignant neoplasm of prostate: Secondary | ICD-10-CM | POA: Diagnosis not present

## 2016-02-12 DIAGNOSIS — Z510 Encounter for antineoplastic radiation therapy: Secondary | ICD-10-CM | POA: Diagnosis not present

## 2016-02-13 ENCOUNTER — Ambulatory Visit
Admission: RE | Admit: 2016-02-13 | Discharge: 2016-02-13 | Disposition: A | Discharge status: Home / Self Care | Payer: Medicare Other | Source: Ambulatory Visit | Attending: Radiation Oncology | Admitting: Radiation Oncology

## 2016-02-13 ENCOUNTER — Other Ambulatory Visit: Payer: Self-pay | Admitting: *Deleted

## 2016-02-13 DIAGNOSIS — C61 Malignant neoplasm of prostate: Secondary | ICD-10-CM | POA: Diagnosis not present

## 2016-02-13 DIAGNOSIS — Z510 Encounter for antineoplastic radiation therapy: Secondary | ICD-10-CM | POA: Diagnosis not present

## 2016-02-16 ENCOUNTER — Ambulatory Visit
Admission: RE | Admit: 2016-02-16 | Discharge: 2016-02-16 | Disposition: A | Discharge status: Home / Self Care | Payer: Medicare Other | Source: Ambulatory Visit | Attending: Radiation Oncology | Admitting: Radiation Oncology

## 2016-02-16 DIAGNOSIS — C61 Malignant neoplasm of prostate: Secondary | ICD-10-CM | POA: Diagnosis not present

## 2016-02-16 DIAGNOSIS — Z510 Encounter for antineoplastic radiation therapy: Secondary | ICD-10-CM | POA: Diagnosis not present

## 2016-02-17 ENCOUNTER — Ambulatory Visit
Admission: RE | Admit: 2016-02-17 | Discharge: 2016-02-17 | Disposition: A | Discharge status: Home / Self Care | Payer: Medicare Other | Source: Ambulatory Visit | Attending: Radiation Oncology | Admitting: Radiation Oncology

## 2016-02-17 DIAGNOSIS — C61 Malignant neoplasm of prostate: Secondary | ICD-10-CM | POA: Diagnosis not present

## 2016-02-17 DIAGNOSIS — Z510 Encounter for antineoplastic radiation therapy: Secondary | ICD-10-CM | POA: Diagnosis not present

## 2016-02-18 ENCOUNTER — Ambulatory Visit
Admission: RE | Admit: 2016-02-18 | Discharge: 2016-02-18 | Disposition: A | Discharge status: Home / Self Care | Payer: Medicare Other | Source: Ambulatory Visit | Attending: Radiation Oncology | Admitting: Radiation Oncology

## 2016-02-18 DIAGNOSIS — C61 Malignant neoplasm of prostate: Secondary | ICD-10-CM | POA: Diagnosis not present

## 2016-02-18 DIAGNOSIS — Z510 Encounter for antineoplastic radiation therapy: Secondary | ICD-10-CM | POA: Diagnosis not present

## 2016-02-19 ENCOUNTER — Ambulatory Visit
Admission: RE | Admit: 2016-02-19 | Discharge: 2016-02-19 | Disposition: A | Discharge status: Home / Self Care | Payer: Medicare Other | Source: Ambulatory Visit | Attending: Radiation Oncology | Admitting: Radiation Oncology

## 2016-02-19 DIAGNOSIS — Z510 Encounter for antineoplastic radiation therapy: Secondary | ICD-10-CM | POA: Diagnosis not present

## 2016-02-19 DIAGNOSIS — C61 Malignant neoplasm of prostate: Secondary | ICD-10-CM | POA: Diagnosis not present

## 2016-02-20 ENCOUNTER — Ambulatory Visit
Admission: RE | Admit: 2016-02-20 | Discharge: 2016-02-20 | Disposition: A | Discharge status: Home / Self Care | Payer: Medicare Other | Source: Ambulatory Visit | Attending: Radiation Oncology | Admitting: Radiation Oncology

## 2016-02-20 DIAGNOSIS — C61 Malignant neoplasm of prostate: Secondary | ICD-10-CM | POA: Diagnosis not present

## 2016-02-20 DIAGNOSIS — Z510 Encounter for antineoplastic radiation therapy: Secondary | ICD-10-CM | POA: Diagnosis not present

## 2016-02-23 ENCOUNTER — Ambulatory Visit
Admission: RE | Admit: 2016-02-23 | Discharge: 2016-02-23 | Disposition: A | Discharge status: Home / Self Care | Payer: Medicare Other | Source: Ambulatory Visit | Attending: Radiation Oncology | Admitting: Radiation Oncology

## 2016-02-23 DIAGNOSIS — I10 Essential (primary) hypertension: Secondary | ICD-10-CM | POA: Diagnosis not present

## 2016-02-23 DIAGNOSIS — Z510 Encounter for antineoplastic radiation therapy: Secondary | ICD-10-CM | POA: Diagnosis not present

## 2016-02-23 DIAGNOSIS — E785 Hyperlipidemia, unspecified: Secondary | ICD-10-CM | POA: Diagnosis not present

## 2016-02-23 DIAGNOSIS — M79609 Pain in unspecified limb: Secondary | ICD-10-CM | POA: Diagnosis not present

## 2016-02-23 DIAGNOSIS — C61 Malignant neoplasm of prostate: Secondary | ICD-10-CM | POA: Diagnosis not present

## 2016-02-23 DIAGNOSIS — I714 Abdominal aortic aneurysm, without rupture: Secondary | ICD-10-CM | POA: Diagnosis not present

## 2016-02-23 DIAGNOSIS — M7989 Other specified soft tissue disorders: Secondary | ICD-10-CM | POA: Diagnosis not present

## 2016-02-23 DIAGNOSIS — Z0000 Encounter for general adult medical examination without abnormal findings: Secondary | ICD-10-CM | POA: Diagnosis not present

## 2016-02-23 DIAGNOSIS — I872 Venous insufficiency (chronic) (peripheral): Secondary | ICD-10-CM | POA: Diagnosis not present

## 2016-02-24 ENCOUNTER — Inpatient Hospital Stay: Payer: Medicare Other

## 2016-02-24 ENCOUNTER — Ambulatory Visit
Admission: RE | Admit: 2016-02-24 | Discharge: 2016-02-24 | Disposition: A | Discharge status: Home / Self Care | Payer: Medicare Other | Source: Ambulatory Visit | Attending: Radiation Oncology | Admitting: Radiation Oncology

## 2016-02-24 DIAGNOSIS — C61 Malignant neoplasm of prostate: Secondary | ICD-10-CM | POA: Diagnosis not present

## 2016-02-24 DIAGNOSIS — Z510 Encounter for antineoplastic radiation therapy: Secondary | ICD-10-CM | POA: Diagnosis not present

## 2016-02-24 LAB — CBC
HCT: 43 % (ref 40.0–52.0)
Hemoglobin: 14.6 g/dL (ref 13.0–18.0)
MCH: 29.5 pg (ref 26.0–34.0)
MCHC: 33.9 g/dL (ref 32.0–36.0)
MCV: 87 fL (ref 80.0–100.0)
Platelets: 240 10*3/uL (ref 150–440)
RBC: 4.94 MIL/uL (ref 4.40–5.90)
RDW: 14.7 % — ABNORMAL HIGH (ref 11.5–14.5)
WBC: 5.8 10*3/uL (ref 3.8–10.6)

## 2016-02-25 ENCOUNTER — Ambulatory Visit
Admission: RE | Admit: 2016-02-25 | Discharge: 2016-02-25 | Disposition: A | Discharge status: Home / Self Care | Payer: Medicare Other | Source: Ambulatory Visit | Attending: Radiation Oncology | Admitting: Radiation Oncology

## 2016-02-25 DIAGNOSIS — C61 Malignant neoplasm of prostate: Secondary | ICD-10-CM | POA: Diagnosis not present

## 2016-02-25 DIAGNOSIS — Z510 Encounter for antineoplastic radiation therapy: Secondary | ICD-10-CM | POA: Diagnosis not present

## 2016-02-26 ENCOUNTER — Ambulatory Visit
Admission: RE | Admit: 2016-02-26 | Discharge: 2016-02-26 | Disposition: A | Discharge status: Home / Self Care | Payer: Medicare Other | Source: Ambulatory Visit | Attending: Radiation Oncology | Admitting: Radiation Oncology

## 2016-02-26 DIAGNOSIS — Z510 Encounter for antineoplastic radiation therapy: Secondary | ICD-10-CM | POA: Diagnosis not present

## 2016-02-26 DIAGNOSIS — C61 Malignant neoplasm of prostate: Secondary | ICD-10-CM | POA: Diagnosis not present

## 2016-02-27 ENCOUNTER — Ambulatory Visit
Admission: RE | Admit: 2016-02-27 | Discharge: 2016-02-27 | Disposition: A | Discharge status: Home / Self Care | Payer: Medicare Other | Source: Ambulatory Visit | Attending: Radiation Oncology | Admitting: Radiation Oncology

## 2016-02-27 DIAGNOSIS — Z510 Encounter for antineoplastic radiation therapy: Secondary | ICD-10-CM | POA: Diagnosis not present

## 2016-02-27 DIAGNOSIS — M79609 Pain in unspecified limb: Secondary | ICD-10-CM | POA: Diagnosis not present

## 2016-02-27 DIAGNOSIS — E785 Hyperlipidemia, unspecified: Secondary | ICD-10-CM | POA: Diagnosis not present

## 2016-02-27 DIAGNOSIS — I714 Abdominal aortic aneurysm, without rupture: Secondary | ICD-10-CM | POA: Diagnosis not present

## 2016-02-27 DIAGNOSIS — M7989 Other specified soft tissue disorders: Secondary | ICD-10-CM | POA: Diagnosis not present

## 2016-02-27 DIAGNOSIS — I10 Essential (primary) hypertension: Secondary | ICD-10-CM | POA: Diagnosis not present

## 2016-02-27 DIAGNOSIS — Z0000 Encounter for general adult medical examination without abnormal findings: Secondary | ICD-10-CM | POA: Diagnosis not present

## 2016-02-27 DIAGNOSIS — I872 Venous insufficiency (chronic) (peripheral): Secondary | ICD-10-CM | POA: Diagnosis not present

## 2016-02-27 DIAGNOSIS — C61 Malignant neoplasm of prostate: Secondary | ICD-10-CM | POA: Diagnosis not present

## 2016-03-01 ENCOUNTER — Ambulatory Visit
Admission: RE | Admit: 2016-03-01 | Discharge: 2016-03-01 | Disposition: A | Discharge status: Home / Self Care | Payer: Medicare Other | Source: Ambulatory Visit | Attending: Radiation Oncology | Admitting: Radiation Oncology

## 2016-03-01 DIAGNOSIS — Z510 Encounter for antineoplastic radiation therapy: Secondary | ICD-10-CM | POA: Diagnosis not present

## 2016-03-01 DIAGNOSIS — C61 Malignant neoplasm of prostate: Secondary | ICD-10-CM | POA: Diagnosis not present

## 2016-03-02 ENCOUNTER — Ambulatory Visit
Admission: RE | Admit: 2016-03-02 | Discharge: 2016-03-02 | Disposition: A | Discharge status: Home / Self Care | Payer: Medicare Other | Source: Ambulatory Visit | Attending: Radiation Oncology | Admitting: Radiation Oncology

## 2016-03-02 DIAGNOSIS — C61 Malignant neoplasm of prostate: Secondary | ICD-10-CM | POA: Diagnosis not present

## 2016-03-02 DIAGNOSIS — Z510 Encounter for antineoplastic radiation therapy: Secondary | ICD-10-CM | POA: Diagnosis not present

## 2016-03-03 ENCOUNTER — Ambulatory Visit
Admission: RE | Admit: 2016-03-03 | Discharge: 2016-03-03 | Disposition: A | Discharge status: Home / Self Care | Payer: Medicare Other | Source: Ambulatory Visit | Attending: Radiation Oncology | Admitting: Radiation Oncology

## 2016-03-03 DIAGNOSIS — Z510 Encounter for antineoplastic radiation therapy: Secondary | ICD-10-CM | POA: Diagnosis not present

## 2016-03-03 DIAGNOSIS — E782 Mixed hyperlipidemia: Secondary | ICD-10-CM | POA: Diagnosis not present

## 2016-03-03 DIAGNOSIS — I10 Essential (primary) hypertension: Secondary | ICD-10-CM | POA: Diagnosis not present

## 2016-03-03 DIAGNOSIS — C61 Malignant neoplasm of prostate: Secondary | ICD-10-CM | POA: Diagnosis not present

## 2016-03-04 ENCOUNTER — Ambulatory Visit
Admission: RE | Admit: 2016-03-04 | Discharge: 2016-03-04 | Disposition: A | Discharge status: Home / Self Care | Payer: Medicare Other | Source: Ambulatory Visit | Attending: Radiation Oncology | Admitting: Radiation Oncology

## 2016-03-04 DIAGNOSIS — C61 Malignant neoplasm of prostate: Secondary | ICD-10-CM | POA: Diagnosis not present

## 2016-03-04 DIAGNOSIS — Z510 Encounter for antineoplastic radiation therapy: Secondary | ICD-10-CM | POA: Diagnosis not present

## 2016-03-05 ENCOUNTER — Ambulatory Visit
Admission: RE | Admit: 2016-03-05 | Discharge: 2016-03-05 | Disposition: A | Discharge status: Home / Self Care | Payer: Medicare Other | Source: Ambulatory Visit | Attending: Radiation Oncology | Admitting: Radiation Oncology

## 2016-03-05 DIAGNOSIS — Z510 Encounter for antineoplastic radiation therapy: Secondary | ICD-10-CM | POA: Diagnosis not present

## 2016-03-05 DIAGNOSIS — C61 Malignant neoplasm of prostate: Secondary | ICD-10-CM | POA: Diagnosis not present

## 2016-03-08 ENCOUNTER — Ambulatory Visit
Admission: RE | Admit: 2016-03-08 | Discharge: 2016-03-08 | Disposition: A | Discharge status: Home / Self Care | Payer: Medicare Other | Source: Ambulatory Visit | Attending: Radiation Oncology | Admitting: Radiation Oncology

## 2016-03-08 DIAGNOSIS — C61 Malignant neoplasm of prostate: Secondary | ICD-10-CM | POA: Diagnosis not present

## 2016-03-08 DIAGNOSIS — Z510 Encounter for antineoplastic radiation therapy: Secondary | ICD-10-CM | POA: Diagnosis not present

## 2016-03-09 ENCOUNTER — Ambulatory Visit
Admission: RE | Admit: 2016-03-09 | Discharge: 2016-03-09 | Disposition: A | Discharge status: Home / Self Care | Payer: Medicare Other | Source: Ambulatory Visit | Attending: Radiation Oncology | Admitting: Radiation Oncology

## 2016-03-09 ENCOUNTER — Inpatient Hospital Stay: Payer: Medicare Other | Attending: Radiation Oncology

## 2016-03-09 DIAGNOSIS — C61 Malignant neoplasm of prostate: Secondary | ICD-10-CM | POA: Insufficient documentation

## 2016-03-09 DIAGNOSIS — Z510 Encounter for antineoplastic radiation therapy: Secondary | ICD-10-CM | POA: Diagnosis not present

## 2016-03-09 LAB — CBC
HCT: 43.3 % (ref 40.0–52.0)
Hemoglobin: 14.6 g/dL (ref 13.0–18.0)
MCH: 29.7 pg (ref 26.0–34.0)
MCHC: 33.6 g/dL (ref 32.0–36.0)
MCV: 88.2 fL (ref 80.0–100.0)
Platelets: 263 10*3/uL (ref 150–440)
RBC: 4.91 MIL/uL (ref 4.40–5.90)
RDW: 15.2 % — ABNORMAL HIGH (ref 11.5–14.5)
WBC: 5.8 10*3/uL (ref 3.8–10.6)

## 2016-03-10 ENCOUNTER — Ambulatory Visit
Admission: RE | Admit: 2016-03-10 | Discharge: 2016-03-10 | Disposition: A | Discharge status: Home / Self Care | Payer: Medicare Other | Source: Ambulatory Visit | Attending: Radiation Oncology | Admitting: Radiation Oncology

## 2016-03-10 DIAGNOSIS — Z510 Encounter for antineoplastic radiation therapy: Secondary | ICD-10-CM | POA: Diagnosis not present

## 2016-03-10 DIAGNOSIS — C61 Malignant neoplasm of prostate: Secondary | ICD-10-CM | POA: Diagnosis not present

## 2016-03-11 ENCOUNTER — Ambulatory Visit
Admission: RE | Admit: 2016-03-11 | Discharge: 2016-03-11 | Disposition: A | Discharge status: Home / Self Care | Payer: Medicare Other | Source: Ambulatory Visit | Attending: Radiation Oncology | Admitting: Radiation Oncology

## 2016-03-11 DIAGNOSIS — C61 Malignant neoplasm of prostate: Secondary | ICD-10-CM | POA: Diagnosis not present

## 2016-03-11 DIAGNOSIS — Z510 Encounter for antineoplastic radiation therapy: Secondary | ICD-10-CM | POA: Diagnosis not present

## 2016-03-12 ENCOUNTER — Ambulatory Visit
Admission: RE | Admit: 2016-03-12 | Discharge: 2016-03-12 | Disposition: A | Discharge status: Home / Self Care | Payer: Medicare Other | Source: Ambulatory Visit | Attending: Radiation Oncology | Admitting: Radiation Oncology

## 2016-03-12 DIAGNOSIS — C61 Malignant neoplasm of prostate: Secondary | ICD-10-CM | POA: Diagnosis not present

## 2016-03-12 DIAGNOSIS — Z510 Encounter for antineoplastic radiation therapy: Secondary | ICD-10-CM | POA: Diagnosis not present

## 2016-03-15 ENCOUNTER — Ambulatory Visit
Admission: RE | Admit: 2016-03-15 | Discharge: 2016-03-15 | Disposition: A | Discharge status: Home / Self Care | Payer: Medicare Other | Source: Ambulatory Visit | Attending: Radiation Oncology | Admitting: Radiation Oncology

## 2016-03-15 DIAGNOSIS — Z510 Encounter for antineoplastic radiation therapy: Secondary | ICD-10-CM | POA: Diagnosis not present

## 2016-03-15 DIAGNOSIS — C61 Malignant neoplasm of prostate: Secondary | ICD-10-CM | POA: Diagnosis not present

## 2016-03-16 ENCOUNTER — Ambulatory Visit
Admission: RE | Admit: 2016-03-16 | Discharge: 2016-03-16 | Disposition: A | Discharge status: Home / Self Care | Payer: Medicare Other | Source: Ambulatory Visit | Attending: Radiation Oncology | Admitting: Radiation Oncology

## 2016-03-16 DIAGNOSIS — Z510 Encounter for antineoplastic radiation therapy: Secondary | ICD-10-CM | POA: Diagnosis not present

## 2016-03-16 DIAGNOSIS — C61 Malignant neoplasm of prostate: Secondary | ICD-10-CM | POA: Diagnosis not present

## 2016-03-17 ENCOUNTER — Ambulatory Visit
Admission: RE | Admit: 2016-03-17 | Discharge: 2016-03-17 | Disposition: A | Discharge status: Home / Self Care | Payer: Medicare Other | Source: Ambulatory Visit | Attending: Radiation Oncology | Admitting: Radiation Oncology

## 2016-03-17 DIAGNOSIS — C61 Malignant neoplasm of prostate: Secondary | ICD-10-CM | POA: Diagnosis not present

## 2016-03-17 DIAGNOSIS — Z510 Encounter for antineoplastic radiation therapy: Secondary | ICD-10-CM | POA: Diagnosis not present

## 2016-03-18 ENCOUNTER — Ambulatory Visit
Admission: RE | Admit: 2016-03-18 | Discharge: 2016-03-18 | Disposition: A | Discharge status: Home / Self Care | Payer: Medicare Other | Source: Ambulatory Visit | Attending: Radiation Oncology | Admitting: Radiation Oncology

## 2016-03-18 DIAGNOSIS — Z510 Encounter for antineoplastic radiation therapy: Secondary | ICD-10-CM | POA: Diagnosis not present

## 2016-03-18 DIAGNOSIS — C61 Malignant neoplasm of prostate: Secondary | ICD-10-CM | POA: Diagnosis not present

## 2016-03-19 ENCOUNTER — Ambulatory Visit
Admission: RE | Admit: 2016-03-19 | Discharge: 2016-03-19 | Disposition: A | Discharge status: Home / Self Care | Payer: Medicare Other | Source: Ambulatory Visit | Attending: Radiation Oncology | Admitting: Radiation Oncology

## 2016-03-19 DIAGNOSIS — Z510 Encounter for antineoplastic radiation therapy: Secondary | ICD-10-CM | POA: Diagnosis not present

## 2016-03-19 DIAGNOSIS — C61 Malignant neoplasm of prostate: Secondary | ICD-10-CM | POA: Diagnosis not present

## 2016-03-22 ENCOUNTER — Ambulatory Visit
Admission: RE | Admit: 2016-03-22 | Discharge: 2016-03-22 | Disposition: A | Discharge status: Home / Self Care | Payer: Medicare Other | Source: Ambulatory Visit | Attending: Radiation Oncology | Admitting: Radiation Oncology

## 2016-03-22 DIAGNOSIS — Z510 Encounter for antineoplastic radiation therapy: Secondary | ICD-10-CM | POA: Diagnosis not present

## 2016-03-22 DIAGNOSIS — I872 Venous insufficiency (chronic) (peripheral): Secondary | ICD-10-CM | POA: Diagnosis not present

## 2016-03-22 DIAGNOSIS — C61 Malignant neoplasm of prostate: Secondary | ICD-10-CM | POA: Diagnosis not present

## 2016-03-22 DIAGNOSIS — E785 Hyperlipidemia, unspecified: Secondary | ICD-10-CM | POA: Diagnosis not present

## 2016-03-22 DIAGNOSIS — M7989 Other specified soft tissue disorders: Secondary | ICD-10-CM | POA: Diagnosis not present

## 2016-03-22 DIAGNOSIS — I10 Essential (primary) hypertension: Secondary | ICD-10-CM | POA: Diagnosis not present

## 2016-03-22 DIAGNOSIS — M79609 Pain in unspecified limb: Secondary | ICD-10-CM | POA: Diagnosis not present

## 2016-03-22 DIAGNOSIS — I714 Abdominal aortic aneurysm, without rupture: Secondary | ICD-10-CM | POA: Diagnosis not present

## 2016-03-22 DIAGNOSIS — Z0000 Encounter for general adult medical examination without abnormal findings: Secondary | ICD-10-CM | POA: Diagnosis not present

## 2016-03-22 DIAGNOSIS — I70213 Atherosclerosis of native arteries of extremities with intermittent claudication, bilateral legs: Secondary | ICD-10-CM | POA: Diagnosis not present

## 2016-03-23 ENCOUNTER — Ambulatory Visit
Admission: RE | Admit: 2016-03-23 | Discharge: 2016-03-23 | Disposition: A | Discharge status: Home / Self Care | Payer: Medicare Other | Source: Ambulatory Visit | Attending: Radiation Oncology | Admitting: Radiation Oncology

## 2016-03-23 DIAGNOSIS — C61 Malignant neoplasm of prostate: Secondary | ICD-10-CM | POA: Diagnosis not present

## 2016-03-23 DIAGNOSIS — H401131 Primary open-angle glaucoma, bilateral, mild stage: Secondary | ICD-10-CM | POA: Diagnosis not present

## 2016-03-23 DIAGNOSIS — Z510 Encounter for antineoplastic radiation therapy: Secondary | ICD-10-CM | POA: Diagnosis not present

## 2016-03-30 DIAGNOSIS — H401131 Primary open-angle glaucoma, bilateral, mild stage: Secondary | ICD-10-CM | POA: Diagnosis not present

## 2016-04-28 ENCOUNTER — Encounter: Payer: Self-pay | Admitting: Radiation Oncology

## 2016-04-28 ENCOUNTER — Other Ambulatory Visit: Payer: Self-pay | Admitting: *Deleted

## 2016-04-28 ENCOUNTER — Ambulatory Visit
Admission: RE | Admit: 2016-04-28 | Discharge: 2016-04-28 | Disposition: A | Discharge status: Home / Self Care | Payer: Medicare Other | Source: Ambulatory Visit | Attending: Radiation Oncology | Admitting: Radiation Oncology

## 2016-04-28 VITALS — BP 114/85 | HR 74 | Temp 95.2°F | Resp 20 | Wt 222.2 lb

## 2016-04-28 DIAGNOSIS — C61 Malignant neoplasm of prostate: Secondary | ICD-10-CM

## 2016-04-28 DIAGNOSIS — R351 Nocturia: Secondary | ICD-10-CM | POA: Insufficient documentation

## 2016-04-28 DIAGNOSIS — I10 Essential (primary) hypertension: Secondary | ICD-10-CM | POA: Insufficient documentation

## 2016-04-28 DIAGNOSIS — J449 Chronic obstructive pulmonary disease, unspecified: Secondary | ICD-10-CM | POA: Insufficient documentation

## 2016-04-28 DIAGNOSIS — G459 Transient cerebral ischemic attack, unspecified: Secondary | ICD-10-CM | POA: Diagnosis not present

## 2016-04-28 DIAGNOSIS — Z923 Personal history of irradiation: Secondary | ICD-10-CM | POA: Diagnosis not present

## 2016-04-28 NOTE — Progress Notes (Signed)
Radiation Oncology Follow up Note  Name: Howard Anderson   Date:   04/28/2016 MRN:  [id number] DOB: [birth year]    This 70 y.o. male presents to the clinic today for one-month follow-up status post I MRT radiation therapy for stage IIa adenocarcinoma the prostate.  REFERRING PROVIDER: Juline Patch, MD  HPI: Patient is a 70 year old male now out 1 month having completed IM RT radiation therapy to his prostate. He initially presented with a PSA over 10 and had Gleason 7 (3+4) adenocarcinoma. He does have multiple medical comorbidities including COPD transient cerebral ischemia and hypertension. He is seen today in routine follow-up continues to have nocturia 3-4 although his wife says that has improved. He's having no significant diarrhea at this time.  COMPLICATIONS OF TREATMENT: none  FOLLOW UP COMPLIANCE: keeps appointments   PHYSICAL EXAM:  BP 114/85   Pulse 74   Temp (!) 95.2 F (35.1 C)   Resp 20   Wt 222 lb 3.6 oz (100.8 kg)   BMI 34.81 kg/m  On rectal exam rectal sphincter tone is good. Prostate is smooth contracted without evidence of nodularity or mass. Sulcus is preserved bilaterally. No discrete nodularity is identified. No other rectal abnormalities are noted. Well-developed well-nourished patient in NAD. HEENT reveals PERLA, EOMI, discs not visualized.  Oral cavity is clear. No oral mucosal lesions are identified. Neck is clear without evidence of cervical or supraclavicular adenopathy. Lungs are clear to A&P. Cardiac examination is essentially unremarkable with regular rate and rhythm without murmur rub or thrill. Abdomen is benign with no organomegaly or masses noted. Motor sensory and DTR levels are equal and symmetric in the upper and lower extremities. Cranial nerves II through XII are grossly intact. Proprioception is intact. No peripheral adenopathy or edema is identified. No motor or sensory levels are noted. Crude visual fields are within normal range.  RADIOLOGY  RESULTS: No current films for review  PLAN: Present time patient is recovering well from his radiation therapy treatments without significant side effect or complaint. I've asked to see him back in 4 months for follow-up and will have ordered a PSA prior to his visit. Patient and wife know to call sooner with any concerns.  I would like to take this opportunity to thank you for allowing me to participate in the care of your patient.Armstead Peaks., MD

## 2016-04-29 ENCOUNTER — Ambulatory Visit: Payer: Self-pay | Admitting: Radiation Oncology

## 2016-04-30 DIAGNOSIS — H401131 Primary open-angle glaucoma, bilateral, mild stage: Secondary | ICD-10-CM | POA: Diagnosis not present

## 2016-05-31 DIAGNOSIS — H401131 Primary open-angle glaucoma, bilateral, mild stage: Secondary | ICD-10-CM | POA: Diagnosis not present

## 2016-05-31 DIAGNOSIS — H43813 Vitreous degeneration, bilateral: Secondary | ICD-10-CM | POA: Diagnosis not present

## 2016-06-02 ENCOUNTER — Telehealth: Payer: Self-pay

## 2016-06-02 NOTE — Telephone Encounter (Signed)
Pt's wife called and said that in January his insurance will be changing and will not cover Spiriva any longer. He was given 3 options: Breo, Incruse or Anoro per insurance. Spoke with Dr Ronnald Ramp and she will change him to Incruse along with Advair inhalers. Howard Anderson was instructed to call back in January and we will make the change at that time.

## 2016-06-05 ENCOUNTER — Other Ambulatory Visit: Payer: Self-pay | Admitting: Family Medicine

## 2016-06-05 DIAGNOSIS — E785 Hyperlipidemia, unspecified: Secondary | ICD-10-CM

## 2016-06-15 ENCOUNTER — Other Ambulatory Visit: Payer: Self-pay | Admitting: Family Medicine

## 2016-06-15 DIAGNOSIS — I10 Essential (primary) hypertension: Secondary | ICD-10-CM

## 2016-06-26 DIAGNOSIS — Z23 Encounter for immunization: Secondary | ICD-10-CM | POA: Diagnosis not present

## 2016-07-06 DIAGNOSIS — H401131 Primary open-angle glaucoma, bilateral, mild stage: Secondary | ICD-10-CM | POA: Diagnosis not present

## 2016-07-19 ENCOUNTER — Other Ambulatory Visit: Payer: Self-pay | Admitting: Family Medicine

## 2016-07-19 DIAGNOSIS — E785 Hyperlipidemia, unspecified: Secondary | ICD-10-CM

## 2016-08-15 ENCOUNTER — Other Ambulatory Visit: Payer: Self-pay | Admitting: Family Medicine

## 2016-08-15 DIAGNOSIS — I10 Essential (primary) hypertension: Secondary | ICD-10-CM

## 2016-08-24 ENCOUNTER — Other Ambulatory Visit: Payer: Self-pay | Admitting: Family Medicine

## 2016-08-24 DIAGNOSIS — J432 Centrilobular emphysema: Secondary | ICD-10-CM

## 2016-08-27 ENCOUNTER — Other Ambulatory Visit: Payer: Self-pay | Admitting: Family Medicine

## 2016-08-27 DIAGNOSIS — E785 Hyperlipidemia, unspecified: Secondary | ICD-10-CM

## 2016-09-01 ENCOUNTER — Inpatient Hospital Stay: Payer: Medicare Other | Attending: Radiation Oncology

## 2016-09-01 DIAGNOSIS — C61 Malignant neoplasm of prostate: Secondary | ICD-10-CM | POA: Diagnosis not present

## 2016-09-01 DIAGNOSIS — Z923 Personal history of irradiation: Secondary | ICD-10-CM | POA: Diagnosis not present

## 2016-09-01 LAB — PSA: PSA: 1.32 ng/mL (ref 0.00–4.00)

## 2016-09-03 ENCOUNTER — Encounter: Payer: Self-pay | Admitting: *Deleted

## 2016-09-08 ENCOUNTER — Ambulatory Visit
Admission: RE | Admit: 2016-09-08 | Discharge: 2016-09-08 | Disposition: A | Discharge status: Home / Self Care | Payer: Medicare Other | Source: Ambulatory Visit | Attending: Radiation Oncology | Admitting: Radiation Oncology

## 2016-09-08 ENCOUNTER — Other Ambulatory Visit: Payer: Self-pay | Admitting: *Deleted

## 2016-09-08 ENCOUNTER — Encounter: Payer: Self-pay | Admitting: Radiation Oncology

## 2016-09-08 VITALS — BP 132/88 | HR 70 | Temp 95.3°F | Resp 18 | Wt 225.0 lb

## 2016-09-08 DIAGNOSIS — Z923 Personal history of irradiation: Secondary | ICD-10-CM | POA: Insufficient documentation

## 2016-09-08 DIAGNOSIS — C61 Malignant neoplasm of prostate: Secondary | ICD-10-CM

## 2016-09-08 NOTE — Progress Notes (Signed)
Radiation Oncology Follow up Note  Name: Howard Anderson   Date:   09/08/2016 MRN:  [id number] DOB: [birth year]    This 71 y.o. male presents to the clinic today for 5 month follow-up status post I MRT radiation therapy for prostate cancer.  REFERRING PROVIDER: Juline Patch, MD  HPI: Patient is a 71 year old male now out 5 months having completed IM RT radiation therapy for a Gleason 7 (3+4) presenting with a PSA over 10. He is seen today in routine follow up and is doing well. He specifically denies diarrhea dysuria or any other GI/GU complaints. His latest PSA was 1.3 on 09/01/2016.[name]  COMPLICATIONS OF TREATMENT: none  FOLLOW UP COMPLIANCE: keeps appointments   PHYSICAL EXAM:  BP 132/88   Pulse 70   Temp (!) 95.3 F (35.2 C)   Resp 18   Wt 224 lb 15.7 oz (102 kg)   BMI 35.24 kg/m  On rectal exam rectal sphincter tone is good. Prostate is smooth contracted without evidence of nodularity or mass. Sulcus is preserved bilaterally. No discrete nodularity is identified. No other rectal abnormalities are noted. Well-developed well-nourished patient in NAD. HEENT reveals PERLA, EOMI, discs not visualized.  Oral cavity is clear. No oral mucosal lesions are identified. Neck is clear without evidence of cervical or supraclavicular adenopathy. Lungs are clear to A&P. Cardiac examination is essentially unremarkable with regular rate and rhythm without murmur rub or thrill. Abdomen is benign with no organomegaly or masses noted. Motor sensory and DTR levels are equal and symmetric in the upper and lower extremities. Cranial nerves II through XII are grossly intact. Proprioception is intact. No peripheral adenopathy or edema is identified. No motor or sensory levels are noted. Crude visual fields are within normal range.  RADIOLOGY RESULTS: No current films for review  PLAN: At this time patient is doing well with good biochemical response to radiation therapy. I'm please was overall progress. I've  asked to see him back in 6 months for follow-up. Will obtain a PSA one week prior to his visit. Patient knows to call sooner with any concerns.  I would like to take this opportunity to thank you for allowing me to participate in the care of your patient.Armstead Peaks., MD

## 2016-09-12 ENCOUNTER — Other Ambulatory Visit: Payer: Self-pay | Admitting: Family Medicine

## 2016-09-12 DIAGNOSIS — I10 Essential (primary) hypertension: Secondary | ICD-10-CM

## 2016-09-27 ENCOUNTER — Ambulatory Visit (INDEPENDENT_AMBULATORY_CARE_PROVIDER_SITE_OTHER): Payer: Medicare Other | Admitting: Family Medicine

## 2016-09-27 ENCOUNTER — Encounter: Payer: Self-pay | Admitting: Family Medicine

## 2016-09-27 VITALS — BP 120/80 | HR 70 | Ht 67.0 in | Wt 223.0 lb

## 2016-09-27 DIAGNOSIS — I679 Cerebrovascular disease, unspecified: Secondary | ICD-10-CM | POA: Diagnosis not present

## 2016-09-27 DIAGNOSIS — C61 Malignant neoplasm of prostate: Secondary | ICD-10-CM

## 2016-09-27 DIAGNOSIS — I714 Abdominal aortic aneurysm, without rupture, unspecified: Secondary | ICD-10-CM

## 2016-09-27 DIAGNOSIS — I10 Essential (primary) hypertension: Secondary | ICD-10-CM | POA: Diagnosis not present

## 2016-09-27 DIAGNOSIS — J440 Chronic obstructive pulmonary disease with acute lower respiratory infection: Secondary | ICD-10-CM

## 2016-09-27 DIAGNOSIS — J432 Centrilobular emphysema: Secondary | ICD-10-CM

## 2016-09-27 DIAGNOSIS — K219 Gastro-esophageal reflux disease without esophagitis: Secondary | ICD-10-CM

## 2016-09-27 DIAGNOSIS — E782 Mixed hyperlipidemia: Secondary | ICD-10-CM

## 2016-09-27 DIAGNOSIS — J209 Acute bronchitis, unspecified: Secondary | ICD-10-CM | POA: Diagnosis not present

## 2016-09-27 MED ORDER — OMEGA-3-ACID ETHYL ESTERS 1 G PO CAPS: 1.0000 | ORAL_CAPSULE | Freq: Two times a day (BID) | ORAL | 1 refills | Status: DC

## 2016-09-27 MED ORDER — ASPIRIN 81 MG PO TABS: 81.0000 mg | ORAL_TABLET | Freq: Every day | ORAL | 11 refills | Status: DC

## 2016-09-27 MED ORDER — RANITIDINE HCL 150 MG PO TABS: 75.0000 mg | ORAL_TABLET | Freq: Every day | ORAL | 1 refills | Status: DC

## 2016-09-27 MED ORDER — TIOTROPIUM BROMIDE MONOHYDRATE 2.5 MCG/ACT IN AERS: INHALATION_SPRAY | RESPIRATORY_TRACT | 6 refills | Status: DC

## 2016-09-27 MED ORDER — TAMSULOSIN HCL 0.4 MG PO CAPS: 0.4000 mg | ORAL_CAPSULE | Freq: Every day | ORAL | 1 refills | Status: DC

## 2016-09-27 MED ORDER — FLUTICASONE-SALMETEROL 250-50 MCG/DOSE IN AEPB: INHALATION_SPRAY | RESPIRATORY_TRACT | 11 refills | Status: DC

## 2016-09-27 MED ORDER — ATORVASTATIN CALCIUM 10 MG PO TABS: 10.0000 mg | ORAL_TABLET | Freq: Every day | ORAL | 1 refills | Status: DC

## 2016-09-27 MED ORDER — VERAPAMIL HCL ER 180 MG PO TBCR: 180.0000 mg | EXTENDED_RELEASE_TABLET | Freq: Two times a day (BID) | ORAL | 1 refills | Status: DC

## 2016-09-27 NOTE — Progress Notes (Signed)
Name: Howard Anderson   MRN: [id number]    DOB: 04-03-1946   Date:09/27/2016       Progress Note  Subjective  Chief Complaint  Chief Complaint  Patient presents with  . Hypertension  . Gastroesophageal Reflux  . Hyperlipidemia  . COPD  . Benign Prostatic Hypertrophy    Hypertension  This is a chronic problem. The current episode started more than 1 year ago. The problem has been gradually improving since onset. The problem is controlled. Pertinent negatives include no anxiety, blurred vision, chest pain, headaches, malaise/fatigue, neck pain, orthopnea, palpitations, peripheral edema, PND, shortness of breath or sweats. There are no associated agents to hypertension. There are no known risk factors for coronary artery disease. Past treatments include calcium channel blockers. The current treatment provides mild improvement. There are no compliance problems.  There is no history of angina, kidney disease, CAD/MI, CVA, heart failure, left ventricular hypertrophy, PVD, renovascular disease or retinopathy. There is no history of chronic renal disease or a hypertension causing med.  Gastroesophageal Reflux  He reports no abdominal pain, no belching, no chest pain, no choking, no coughing, no dysphagia, no early satiety, no globus sensation, no heartburn, no hoarse voice, no nausea, no sore throat, no stridor, no tooth decay, no water brash or no wheezing. This is a chronic problem. The current episode started more than 1 year ago. The problem occurs occasionally. The problem has been waxing and waning. The symptoms are aggravated by certain foods. Pertinent negatives include no anemia, fatigue, melena, muscle weakness, orthopnea or weight loss. There are no known risk factors. He has tried a histamine-2 antagonist for the symptoms. The treatment provided mild relief. Past procedures do not include a UGI.  Hyperlipidemia  This is a chronic problem. The current episode started more than 1 year ago. The  problem is controlled. Recent lipid tests were reviewed and are normal. He has no history of chronic renal disease, diabetes, hypothyroidism, liver disease or obesity. There are no known factors aggravating his hyperlipidemia. Pertinent negatives include no chest pain, focal weakness, myalgias or shortness of breath. Current antihyperlipidemic treatment includes statins. The current treatment provides mild improvement of lipids. There are no compliance problems.  Risk factors for coronary artery disease include dyslipidemia and hypertension.  Benign Prostatic Hypertrophy  This is a chronic problem. The current episode started more than 1 year ago. Irritative symptoms include nocturia. Irritative symptoms do not include frequency or urgency. Obstructive symptoms do not include dribbling, incomplete emptying, an intermittent stream, a slower stream, straining or a weak stream. Pertinent negatives include no chills, dysuria, hematuria, hesitancy or nausea. Nothing aggravates the symptoms. The treatment provided mild relief.  Shortness of Breath  This is a chronic problem. The current episode started more than 1 year ago. The problem occurs intermittently. The problem has been waxing and waning. Pertinent negatives include no abdominal pain, chest pain, ear pain, fever, headaches, leg swelling, neck pain, orthopnea, PND, rash, sore throat, sputum production or wheezing. He has tried beta agonist inhalers for the symptoms. The treatment provided mild relief. His past medical history is significant for COPD. There is no history of a heart failure.    No problem-specific Assessment & Plan notes found for this encounter.   Past Medical History:  Diagnosis Date  . AAA (abdominal aortic aneurysm) without rupture ([place])   . AAA (abdominal aortic aneurysm) without rupture ([place])   . Cancer [facility])    prostate cancer  . Chest pain   .  COPD (chronic obstructive pulmonary disease) ([place])   . Dizziness and giddiness    . GERD (gastroesophageal reflux disease)   . Heart disease   . Hyperlipidemia   . Hypertension   . Prostate cancer ([place])   . Shortness of breath dyspnea   . Shoulder fracture, right   . Stroke [facility])    tia    Past Surgical History:  Procedure Laterality Date  . COLONOSCOPY  2010  . EMBOLECTOMY Left 06/20/2015   Procedure: EMBOLECTOMY;  Surgeon: [name], MD;  Location: [facility] ORS;  Service: Vascular;  Laterality: Left;  brachial thrombectomy  . ENDARTERECTOMY FEMORAL Left 06/20/2015   Procedure: ENDARTERECTOMY FEMORAL/FEMORAL PSEUDOANEURYSM repair;  Surgeon: [name], MD;  Location: [facility] ORS;  Service: Vascular;  Laterality: Left;  femerol  . INNER EAR SURGERY    . PERIPHERAL VASCULAR CATHETERIZATION N/A 05/21/2015   Procedure: Endovascular Repair/Stent Graft;  Surgeon: [name], MD;  Location: [place] [facility];  Service: Cardiovascular;  Laterality: N/A;  . PERIPHERAL VASCULAR CATHETERIZATION  06/20/2015   Procedure: Lower Extremity Angiography;  Surgeon: [name], MD;  Location: [place] CV [facility];  Service: Cardiovascular;;  . PERIPHERAL VASCULAR CATHETERIZATION  06/20/2015   Procedure: Lower Extremity Intervention;  Surgeon: [name], MD;  Location: [place] [facility];  Service: Cardiovascular;;    No family history on file.  Social History   Social History  . Marital status: Married    Spouse name: N/A  . Number of children: N/A  . Years of education: N/A   Occupational History  . Not on file.   Social History Main Topics  . Smoking status: Former Smoker    Packs/day: 1.50    Years: 48.00    Quit date: 10/10/2013  . Smokeless tobacco: Never Used  . Alcohol use No  . Drug use: No  . Sexual activity: Not on file   Other Topics Concern  . Not on file   Social History Narrative  . No narrative on file    Allergies  Allergen Reactions  . No Known Allergies     Outpatient Medications Prior to Visit   Medication Sig Dispense Refill  . Cholecalciferol (VITAMIN D3) 1000 UNITS CAPS Take 1 capsule by mouth 2 (two) times daily.    . dorzolamide (TRUSOPT) 2 % ophthalmic solution INSTILL 1 DROP BOTH EYES TWO TIMES A DAY  3  . doxycycline (PERIOSTAT) 20 MG tablet Take 20 mg by mouth 2 (two) times daily.    [name] latanoprost (XALATAN) 0.005 % ophthalmic solution 1 drop at bedtime.     . meclizine (ANTIVERT) 25 MG tablet Take 1 tablet (25 mg total) by mouth 3 (three) times daily as needed for dizziness. 30 tablet 6  . Misc Natural Products (CYSTEX) LIQD Take 1 Dose by mouth.    . Multiple Vitamins-Minerals (PRESERVISION AREDS PO) Take 1 capsule by mouth 2 (two) times daily.    [name] Propylene Glycol (SYSTANE BALANCE) 0.6 % SOLN Apply 1 drop to eye 2 (two) times daily.    [name] ADVAIR DISKUS 250-50 MCG/DOSE AEPB INHALE 1 PUFF INTO THE LUNGS 2 TIMES DAILY 180 each 0  . aspirin 81 MG tablet Take 1 tablet (81 mg total) by mouth daily. 30 tablet 11  . atorvastatin (LIPITOR) 10 MG tablet TAKE 1 TABLET (10 MG TOTAL) BY MOUTH DAILY. 30 tablet 1  . omega-3 acid ethyl esters (LOVAZA) 1 g capsule TAKE 1 CAPSULE (1 G TOTAL) BY MOUTH 2 (TWO)  TIMES DAILY. 180 capsule 0  . ranitidine (ZANTAC) 150 MG tablet Take 0.5 tablets (75 mg total) by mouth daily. 30 tablet 6  . tamsulosin (FLOMAX) 0.4 MG CAPS capsule Take 0.4 mg by mouth.    . Tiotropium Bromide Monohydrate (SPIRIVA RESPIMAT) 2.5 MCG/ACT AERS INHALE 1 INHALATION DAILY 1 Inhaler 6  . verapamil (CALAN-SR) 180 MG CR tablet TAKE 1 TABLET BY MOUTH TWICE A DAY 60 tablet 0  . ALPRAZolam (XANAX) 1 MG tablet      No facility-administered medications prior to visit.     Review of Systems  Constitutional: Negative for chills, fatigue, fever, malaise/fatigue and weight loss.  HENT: Negative for ear discharge, ear pain, hoarse voice and sore throat.   Eyes: Negative for blurred vision.  Respiratory: Negative for cough, sputum production, choking, shortness of breath and  wheezing.   Cardiovascular: Negative for chest pain, palpitations, orthopnea, leg swelling and PND.  Gastrointestinal: Negative for abdominal pain, blood in stool, constipation, diarrhea, dysphagia, heartburn, melena and nausea.  Genitourinary: Positive for nocturia. Negative for dysuria, frequency, hematuria, hesitancy, incomplete emptying and urgency.  Musculoskeletal: Negative for back pain, joint pain, myalgias, muscle weakness and neck pain.  Skin: Negative for rash.  Neurological: Negative for dizziness, tingling, sensory change, focal weakness and headaches.  Endo/Heme/Allergies: Negative for environmental allergies and polydipsia. Does not bruise/bleed easily.  Psychiatric/Behavioral: Negative for depression and suicidal ideas. The patient is not nervous/anxious and does not have insomnia.      Objective  Vitals:   09/27/16 1037  BP: 120/80  Pulse: 70  Weight: 223 lb (101.2 kg)  Height: 5\' 7"  (1.702 m)    Physical Exam  Constitutional: He is oriented to person, place, and time and well-developed, well-nourished, and in no distress.  HENT:  Head: Normocephalic.  Right Ear: External ear normal.  Left Ear: External ear normal.  Nose: Nose normal.  Mouth/Throat: Oropharynx is clear and moist.  Eyes: Conjunctivae and EOM are normal. Pupils are equal, round, and reactive to light. Right eye exhibits no discharge. Left eye exhibits no discharge. No scleral icterus.  Neck: Normal range of motion. Neck supple. No JVD present. No tracheal deviation present. No thyromegaly present.  Cardiovascular: Normal rate, regular rhythm, normal heart sounds and intact distal pulses.  Exam reveals no gallop and no friction rub.   No murmur heard. Pulmonary/Chest: Breath sounds normal. No respiratory distress. He has no wheezes. He has no rales.  Abdominal: Soft. Bowel sounds are normal. He exhibits no mass. There is no hepatosplenomegaly. There is no tenderness. There is no rebound, no guarding  and no CVA tenderness.  Musculoskeletal: Normal range of motion. He exhibits no edema or tenderness.  Lymphadenopathy:    He has no cervical adenopathy.  Neurological: He is alert and oriented to person, place, and time. He has normal sensation, normal strength, normal reflexes and intact cranial nerves. No cranial nerve deficit.  Skin: Skin is warm. No rash noted.  Psychiatric: Mood and affect normal.  Nursing note and vitals reviewed.     Assessment & Plan  Problem List Items Addressed This Visit      Cardiovascular and Mediastinum   AAA (abdominal aortic aneurysm) (HCC) - Primary   Relevant Medications   verapamil (CALAN-SR) 180 MG CR tablet   omega-3 acid ethyl esters (LOVAZA) 1 g capsule   atorvastatin (LIPITOR) 10 MG tablet   aspirin 81 MG tablet   Essential hypertension   Relevant Medications   verapamil (CALAN-SR) 180 MG CR tablet  omega-3 acid ethyl esters (LOVAZA) 1 g capsule   atorvastatin (LIPITOR) 10 MG tablet   aspirin 81 MG tablet   Other Relevant Orders   Renal Function Panel   Cerebral vascular disease   Relevant Medications   verapamil (CALAN-SR) 180 MG CR tablet   omega-3 acid ethyl esters (LOVAZA) 1 g capsule   atorvastatin (LIPITOR) 10 MG tablet   aspirin 81 MG tablet     Respiratory   Centrilobular emphysema (HCC)   Relevant Medications   Fluticasone-Salmeterol (ADVAIR DISKUS) 250-50 MCG/DOSE AEPB   Tiotropium Bromide Monohydrate (SPIRIVA RESPIMAT) 2.5 MCG/ACT AERS     Digestive   Esophageal reflux   Relevant Medications   ranitidine (ZANTAC) 150 MG tablet     Genitourinary   Prostate cancer (HCC)   Relevant Medications   tamsulosin (FLOMAX) 0.4 MG CAPS capsule   aspirin 81 MG tablet     Other   Hyperlipidemia   Relevant Medications   verapamil (CALAN-SR) 180 MG CR tablet   omega-3 acid ethyl esters (LOVAZA) 1 g capsule   atorvastatin (LIPITOR) 10 MG tablet   aspirin 81 MG tablet   Other Relevant Orders   Lipid Profile       Meds ordered this encounter  Medications  . Fluticasone-Salmeterol (ADVAIR DISKUS) 250-50 MCG/DOSE AEPB    Sig: INHALE 1 PUFF INTO THE LUNGS 2 TIMES DAILY    Dispense:  180 each    Refill:  11  . verapamil (CALAN-SR) 180 MG CR tablet    Sig: Take 1 tablet (180 mg total) by mouth 2 (two) times daily.    Dispense:  180 tablet    Refill:  1    Send to cardio at [facility]  . ranitidine (ZANTAC) 150 MG tablet    Sig: Take 0.5 tablets (75 mg total) by mouth daily.    Dispense:  90 tablet    Refill:  1    Last time filling- needs fasting med refill appt  . omega-3 acid ethyl esters (LOVAZA) 1 g capsule    Sig: Take 1 capsule (1 g total) by mouth 2 (two) times daily.    Dispense:  180 capsule    Refill:  1    sched appt  . atorvastatin (LIPITOR) 10 MG tablet    Sig: Take 1 tablet (10 mg total) by mouth daily.    Dispense:  90 tablet    Refill:  1  . Tiotropium Bromide Monohydrate (SPIRIVA RESPIMAT) 2.5 MCG/ACT AERS    Sig: INHALE 1 INHALATION DAILY    Dispense:  1 Inhaler    Refill:  6  . tamsulosin (FLOMAX) 0.4 MG CAPS capsule    Sig: Take 1 capsule (0.4 mg total) by mouth daily.    Dispense:  90 capsule    Refill:  1  . aspirin 81 MG tablet    Sig: Take 1 tablet (81 mg total) by mouth daily.    Dispense:  30 tablet    Refill:  11      Dr. Deanna Jones [facility]  09/27/16

## 2016-09-28 LAB — LIPID PANEL
Chol/HDL Ratio: 2.1 ratio units (ref 0.0–5.0)
Cholesterol, Total: 108 mg/dL (ref 100–199)
HDL: 51 mg/dL (ref >39)
LDL Calculated: 41 mg/dL (ref 0–99)
Triglycerides: 81 mg/dL (ref 0–149)
VLDL Cholesterol Cal: 16 mg/dL (ref 5–40)

## 2016-09-28 LAB — RENAL FUNCTION PANEL
Albumin: 4.4 g/dL (ref 3.5–4.8)
BUN/Creatinine Ratio: 18 (ref 10–24)
BUN: 18 mg/dL (ref 8–27)
CO2: 23 mmol/L (ref 18–29)
Calcium: 9.3 mg/dL (ref 8.6–10.2)
Chloride: 102 mmol/L (ref 96–106)
Creatinine, Ser: 1 mg/dL (ref 0.76–1.27)
GFR calc Af Amer: 87 mL/min/{1.73_m2} (ref >59)
GFR calc non Af Amer: 75 mL/min/{1.73_m2} (ref >59)
Glucose: 123 mg/dL — ABNORMAL HIGH (ref 65–99)
Phosphorus: 3 mg/dL (ref 2.5–4.5)
Potassium: 4.8 mmol/L (ref 3.5–5.2)
Sodium: 140 mmol/L (ref 134–144)

## 2016-09-28 MED ORDER — UMECLIDINIUM BROMIDE 62.5 MCG/INH IN AEPB: 1.0000 | INHALATION_SPRAY | Freq: Every day | RESPIRATORY_TRACT | 1 refills | Status: DC

## 2016-09-28 NOTE — Addendum Note (Signed)
Addended by: Berton Mount L on: 09/28/2016 11:06 AM   Modules accepted: Orders

## 2016-10-02 ENCOUNTER — Other Ambulatory Visit: Payer: Self-pay | Admitting: Family Medicine

## 2016-10-02 DIAGNOSIS — E785 Hyperlipidemia, unspecified: Secondary | ICD-10-CM

## 2016-10-19 DIAGNOSIS — E782 Mixed hyperlipidemia: Secondary | ICD-10-CM | POA: Diagnosis not present

## 2016-10-19 DIAGNOSIS — Z8673 Personal history of transient ischemic attack (TIA), and cerebral infarction without residual deficits: Secondary | ICD-10-CM | POA: Diagnosis not present

## 2016-10-19 DIAGNOSIS — I10 Essential (primary) hypertension: Secondary | ICD-10-CM | POA: Diagnosis not present

## 2016-10-19 DIAGNOSIS — R600 Localized edema: Secondary | ICD-10-CM | POA: Diagnosis not present

## 2016-10-19 DIAGNOSIS — I714 Abdominal aortic aneurysm, without rupture: Secondary | ICD-10-CM | POA: Diagnosis not present

## 2016-10-25 DIAGNOSIS — I872 Venous insufficiency (chronic) (peripheral): Secondary | ICD-10-CM | POA: Insufficient documentation

## 2016-10-25 DIAGNOSIS — E782 Mixed hyperlipidemia: Secondary | ICD-10-CM | POA: Diagnosis not present

## 2016-10-25 DIAGNOSIS — I10 Essential (primary) hypertension: Secondary | ICD-10-CM | POA: Diagnosis not present

## 2016-10-25 DIAGNOSIS — I714 Abdominal aortic aneurysm, without rupture: Secondary | ICD-10-CM | POA: Diagnosis not present

## 2016-11-22 DIAGNOSIS — R600 Localized edema: Secondary | ICD-10-CM | POA: Diagnosis not present

## 2016-11-22 DIAGNOSIS — I714 Abdominal aortic aneurysm, without rupture: Secondary | ICD-10-CM | POA: Diagnosis not present

## 2016-11-22 DIAGNOSIS — I872 Venous insufficiency (chronic) (peripheral): Secondary | ICD-10-CM | POA: Diagnosis not present

## 2016-11-22 DIAGNOSIS — Z8673 Personal history of transient ischemic attack (TIA), and cerebral infarction without residual deficits: Secondary | ICD-10-CM | POA: Diagnosis not present

## 2016-11-22 DIAGNOSIS — E782 Mixed hyperlipidemia: Secondary | ICD-10-CM | POA: Diagnosis not present

## 2016-11-22 DIAGNOSIS — I10 Essential (primary) hypertension: Secondary | ICD-10-CM | POA: Diagnosis not present

## 2016-11-22 DIAGNOSIS — I38 Endocarditis, valve unspecified: Secondary | ICD-10-CM | POA: Diagnosis not present

## 2017-02-03 DIAGNOSIS — H353132 Nonexudative age-related macular degeneration, bilateral, intermediate dry stage: Secondary | ICD-10-CM | POA: Diagnosis not present

## 2017-03-02 ENCOUNTER — Other Ambulatory Visit: Payer: Medicare Other

## 2017-03-07 DIAGNOSIS — H353132 Nonexudative age-related macular degeneration, bilateral, intermediate dry stage: Secondary | ICD-10-CM | POA: Diagnosis not present

## 2017-03-09 ENCOUNTER — Ambulatory Visit: Payer: Medicare Other | Admitting: Radiation Oncology

## 2017-03-17 ENCOUNTER — Inpatient Hospital Stay: Payer: Medicare Other | Attending: Radiation Oncology

## 2017-03-17 DIAGNOSIS — C61 Malignant neoplasm of prostate: Secondary | ICD-10-CM | POA: Diagnosis not present

## 2017-03-17 LAB — PSA: Prostatic Specific Antigen: 0.72 ng/mL (ref 0.00–4.00)

## 2017-03-23 ENCOUNTER — Ambulatory Visit
Admission: RE | Admit: 2017-03-23 | Discharge: 2017-03-23 | Disposition: A | Discharge status: Home / Self Care | Payer: Medicare Other | Source: Ambulatory Visit | Attending: Radiation Oncology | Admitting: Radiation Oncology

## 2017-03-23 ENCOUNTER — Encounter: Payer: Self-pay | Admitting: Radiation Oncology

## 2017-03-23 VITALS — BP 127/81 | HR 90 | Temp 98.0°F | Wt 229.5 lb

## 2017-03-23 DIAGNOSIS — Z923 Personal history of irradiation: Secondary | ICD-10-CM | POA: Insufficient documentation

## 2017-03-23 DIAGNOSIS — C61 Malignant neoplasm of prostate: Secondary | ICD-10-CM

## 2017-03-23 DIAGNOSIS — R3915 Urgency of urination: Secondary | ICD-10-CM | POA: Diagnosis not present

## 2017-03-23 DIAGNOSIS — R351 Nocturia: Secondary | ICD-10-CM | POA: Insufficient documentation

## 2017-03-23 NOTE — Progress Notes (Signed)
Radiation Oncology Follow up Note  Name: Howard Anderson   Date:   03/23/2017 MRN:  [id number] DOB: 1946/05/20    This 71 y.o. male presents to the clinic today for 11 month follow-up status post I MRT radiation therapy for Gleason 7 adenocarcinoma.  REFERRING PROVIDER: Juline Patch, MD  HPI: patient is a 71 year old male now seen out 11 months having completed IM RT radiation therapy for Gleason 7 (3+4) ipresenting with a PSA of 10. He is seen today in routine follow-up and is doing well. He specifically denies diarrhea dysuria. He does have urgency and nocturia 3. His most recent PSA this month was 3.54. COMPLICATIONS OF TREATMENT: none  FOLLOW UP COMPLIANCE: keeps appointments   PHYSICAL EXAM:  BP 127/81   Pulse 90   Temp 98 F (36.7 C)   Wt 229 lb 8 oz (104.1 kg)   BMI 35.94 kg/m  On rectal exam rectal sphincter tone is good. Prostate is smooth contracted without evidence of nodularity or mass. Sulcus is preserved bilaterally. No discrete nodularity is identified. No other rectal abnormalities are noted. Well-developed well-nourished patient in NAD. HEENT reveals PERLA, EOMI, discs not visualized.  Oral cavity is clear. No oral mucosal lesions are identified. Neck is clear without evidence of cervical or supraclavicular adenopathy. Lungs are clear to A&P. Cardiac examination is essentially unremarkable with regular rate and rhythm without murmur rub or thrill. Abdomen is benign with no organomegaly or masses noted. Motor sensory and DTR levels are equal and symmetric in the upper and lower extremities. Cranial nerves II through XII are grossly intact. Proprioception is intact. No peripheral adenopathy or edema is identified. No motor or sensory levels are noted. Crude visual fields are within normal range.  RADIOLOGY RESULTS: no current films for review  PLAN: present time patient is doing well with excellent biochemical control his prostate cancer. He continues follow-up care with  urology with Dr. Bernardo Heater who is moving back to [facility] and will be following the patient. I have asked to see him back in 1 year for follow-up with a PSA one week prior to his visit. Patient is to call sooner with any concerns.  I would like to take this opportunity to thank you for allowing me to participate in the care of your patient.Armstead Peaks., MD

## 2017-03-25 ENCOUNTER — Other Ambulatory Visit (INDEPENDENT_AMBULATORY_CARE_PROVIDER_SITE_OTHER): Payer: Self-pay | Admitting: Vascular Surgery

## 2017-03-25 DIAGNOSIS — I714 Abdominal aortic aneurysm, without rupture, unspecified: Secondary | ICD-10-CM

## 2017-03-25 DIAGNOSIS — Z8679 Personal history of other diseases of the circulatory system: Secondary | ICD-10-CM

## 2017-03-28 ENCOUNTER — Ambulatory Visit (INDEPENDENT_AMBULATORY_CARE_PROVIDER_SITE_OTHER): Payer: Medicare Other

## 2017-03-28 ENCOUNTER — Encounter (INDEPENDENT_AMBULATORY_CARE_PROVIDER_SITE_OTHER): Payer: Medicare Other

## 2017-03-28 ENCOUNTER — Other Ambulatory Visit (INDEPENDENT_AMBULATORY_CARE_PROVIDER_SITE_OTHER): Payer: Medicare Other

## 2017-03-28 ENCOUNTER — Other Ambulatory Visit (INDEPENDENT_AMBULATORY_CARE_PROVIDER_SITE_OTHER): Payer: Self-pay | Admitting: Vascular Surgery

## 2017-03-28 ENCOUNTER — Ambulatory Visit (INDEPENDENT_AMBULATORY_CARE_PROVIDER_SITE_OTHER): Payer: Self-pay | Admitting: Vascular Surgery

## 2017-03-28 ENCOUNTER — Encounter (INDEPENDENT_AMBULATORY_CARE_PROVIDER_SITE_OTHER): Payer: Self-pay

## 2017-03-28 DIAGNOSIS — I714 Abdominal aortic aneurysm, without rupture, unspecified: Secondary | ICD-10-CM

## 2017-03-28 DIAGNOSIS — I739 Peripheral vascular disease, unspecified: Secondary | ICD-10-CM

## 2017-03-28 DIAGNOSIS — Z8679 Personal history of other diseases of the circulatory system: Secondary | ICD-10-CM | POA: Diagnosis not present

## 2017-04-02 ENCOUNTER — Other Ambulatory Visit: Payer: Self-pay | Admitting: Family Medicine

## 2017-04-02 DIAGNOSIS — E782 Mixed hyperlipidemia: Secondary | ICD-10-CM

## 2017-04-21 ENCOUNTER — Telehealth (INDEPENDENT_AMBULATORY_CARE_PROVIDER_SITE_OTHER): Payer: Self-pay

## 2017-04-21 NOTE — Telephone Encounter (Signed)
Patient's wife called stating she had called yesterday to because the patient wanted something in writing stating he does not have to be seen in a year but has heard back from anyone yet. I asked if he wanted a copy of his u/s because he wasn't seen on the day he had his u/s so I do not have an office visit. She stated he was told by the PA that he did not have to come back but he doesn't trust the PA. I offered the patient an appt to be seen by Dr. [name] to discuss his u/s but he declined.

## 2017-05-03 ENCOUNTER — Other Ambulatory Visit: Payer: Self-pay | Admitting: Family Medicine

## 2017-05-10 ENCOUNTER — Other Ambulatory Visit: Payer: Self-pay

## 2017-05-17 DIAGNOSIS — H3554 Dystrophies primarily involving the retinal pigment epithelium: Secondary | ICD-10-CM | POA: Diagnosis not present

## 2017-05-25 DIAGNOSIS — E782 Mixed hyperlipidemia: Secondary | ICD-10-CM | POA: Diagnosis not present

## 2017-05-25 DIAGNOSIS — I38 Endocarditis, valve unspecified: Secondary | ICD-10-CM | POA: Diagnosis not present

## 2017-05-25 DIAGNOSIS — I10 Essential (primary) hypertension: Secondary | ICD-10-CM | POA: Diagnosis not present

## 2017-05-25 DIAGNOSIS — I714 Abdominal aortic aneurysm, without rupture: Secondary | ICD-10-CM | POA: Diagnosis not present

## 2017-06-01 NOTE — Progress Notes (Signed)
MRN : [id number]  Howard Anderson is a 71 y.o. (28-Aug-1945) male who presents with chief complaint of No chief complaint on file. [name]  History of Present Illness: The patient returns to the office for surveillance of an abdominal aortic aneurysm status post stent graft placement on 05/21/2015.   Patient denies abdominal pain or back pain, no other abdominal complaints. No groin related complaints. No symptoms consistent with distal embolization No changes in claudication distance.   There have been no interval changes in his overall healthcare since his last visit.   Patient denies amaurosis fugax or TIA symptoms. There is no history of claudication or rest pain symptoms of the lower extremities. The patient denies angina or shortness of breath.    No outpatient prescriptions have been marked as taking for the 06/02/17 encounter (Appointment) with [name], [name], MD.    Past Medical History:  Diagnosis Date  . AAA (abdominal aortic aneurysm) without rupture ([place])   . AAA (abdominal aortic aneurysm) without rupture ([name])   . Cancer [facility])    prostate cancer  . Chest pain   . COPD (chronic obstructive pulmonary disease) ([place])   . Dizziness and giddiness   . GERD (gastroesophageal reflux disease)   . Heart disease   . Hyperlipidemia   . Hypertension   . Prostate cancer ([place])   . Shortness of breath dyspnea   . Shoulder fracture, right   . Stroke [facility])    tia    Past Surgical History:  Procedure Laterality Date  . COLONOSCOPY  2010  . EMBOLECTOMY Left 06/20/2015   Procedure: EMBOLECTOMY;  Surgeon: [name], MD;  Location: [facility] ORS;  Service: Vascular;  Laterality: Left;  brachial thrombectomy  . ENDARTERECTOMY FEMORAL Left 06/20/2015   Procedure: ENDARTERECTOMY FEMORAL/FEMORAL PSEUDOANEURYSM repair;  Surgeon: [name], MD;  Location: [facility] ORS;  Service: Vascular;  Laterality: Left;  femerol  . INNER EAR SURGERY    . PERIPHERAL VASCULAR CATHETERIZATION N/A  05/21/2015   Procedure: Endovascular Repair/Stent Graft;  Surgeon: [name], MD;  Location: [place] CV LAB;  Service: Cardiovascular;  Laterality: N/A;  . PERIPHERAL VASCULAR CATHETERIZATION  06/20/2015   Procedure: Lower Extremity Angiography;  Surgeon: [name], MD;  Location: [place] CV LAB;  Service: Cardiovascular;;  . PERIPHERAL VASCULAR CATHETERIZATION  06/20/2015   Procedure: Lower Extremity Intervention;  Surgeon: [name], MD;  Location: [place] CV LAB;  Service: Cardiovascular;;    Social History Social History  Substance Use Topics  . Smoking status: Former Smoker    Packs/day: 1.50    Years: 48.00    Quit date: 10/10/2013  . Smokeless tobacco: Never Used  . Alcohol use No    Family History No family history on file.  Allergies  Allergen Reactions  . No Known Allergies      REVIEW OF SYSTEMS (Negative unless checked)  Constitutional: [] Weight loss  [] Fever  [] Chills Cardiac: [] Chest pain   [] Chest pressure   [] Palpitations   [] Shortness of breath when laying flat   [] Shortness of breath with exertion. Vascular:  [] Pain in legs with walking   [] Pain in legs at rest  [] History of DVT   [] Phlebitis   [] Swelling in legs   [] Varicose veins   [] Non-healing ulcers Pulmonary:   [] Uses home oxygen   [] Productive cough   [] Hemoptysis   [] Wheeze  [] COPD   [] Asthma Neurologic:  [] Dizziness   [] Seizures   [] History of stroke   [] History of  TIA  [] Aphasia   [] Vissual changes   [] Weakness or numbness in arm   [] Weakness or numbness in leg Musculoskeletal:   [] Joint swelling   [] Joint pain   [] Low back pain Hematologic:  [] Easy bruising  [] Easy bleeding   [] Hypercoagulable state   [] Anemic Gastrointestinal:  [] Diarrhea   [] Vomiting  [] Gastroesophageal reflux/heartburn   [] Difficulty swallowing. Genitourinary:  [] Chronic kidney disease   [] Difficult urination  [] Frequent urination   [] Blood in urine Skin:  [] Rashes   [] Ulcers    Psychological:  [] History of anxiety   []  History of major depression.  Physical Examination  There were no vitals filed for this visit. There is no height or weight on file to calculate BMI. Gen: WD/WN, NAD Head: [place]/AT, No temporalis wasting.  Ear/Nose/Throat: Hearing grossly intact, nares w/o erythema or drainage Eyes: PER, EOMI, sclera nonicteric.  Neck: Supple, no large masses.   Pulmonary:  Good air movement, no audible wheezing bilaterally, no use of accessory muscles.  Cardiac: RRR, no JVD Vascular:  Vessel Right Left  Radial Palpable Palpable  Ulnar Palpable Palpable  Brachial Palpable Palpable  Carotid Palpable Palpable  Femoral Palpable Palpable  Popliteal Palpable Palpable  PT Palpable Palpable  DP Palpable Palpable  Gastrointestinal: Non-distended. No guarding/no peritoneal signs.  Musculoskeletal: M/S 5/5 throughout.  No deformity or atrophy.  Neurologic: CN 2-12 intact. Symmetrical.  Speech is fluent. Motor exam as listed above. Psychiatric: Judgment intact, Mood & affect appropriate for pt's clinical situation. Dermatologic: No rashes or ulcers noted.  No changes consistent with cellulitis. Lymph : No lichenification or skin changes of chronic lymphedema.  CBC Lab Results  Component Value Date   WBC 5.8 03/09/2016   HGB 14.6 03/09/2016   HCT 43.3 03/09/2016   MCV 88.2 03/09/2016   PLT 263 03/09/2016    BMET    Component Value Date/Time   NA 140 09/27/2016 0000   NA 139 08/02/2014 1128   K 4.8 09/27/2016 0000   K 4.2 08/02/2014 1128   CL 102 09/27/2016 0000   CL 106 08/02/2014 1128   CO2 23 09/27/2016 0000   CO2 25 08/02/2014 1128   GLUCOSE 123 (H) 09/27/2016 0000   GLUCOSE 129 (H) 01/01/2016 0526   GLUCOSE 175 (H) 08/02/2014 1128   BUN 18 09/27/2016 0000   BUN 16 08/02/2014 1128   CREATININE 1.00 09/27/2016 0000   CREATININE 0.74 08/03/2014 0830   CALCIUM 9.3 09/27/2016 0000   CALCIUM 7.9 (L) 08/02/2014 1128   GFRNONAA 75 09/27/2016 0000    GFRNONAA >60 08/03/2014 0830   GFRNONAA >60 10/18/2013 0502   GFRAA 87 09/27/2016 0000   GFRAA >60 08/03/2014 0830   GFRAA >60 10/18/2013 0502   CrCl cannot be calculated (Patient's most recent lab result is older than the maximum 21 days allowed.).  COAG Lab Results  Component Value Date   INR 0.98 05/08/2015    Radiology No results found.  Assessment/Plan 1. Abdominal aortic aneurysm (AAA) without rupture (HCC) Recommend: Patient is status post successful endovascular repair of the AAA.   No further intervention is required at this time.   No endoleak is detected and the aneurysm sac is stable.  The patient will continue antiplatelet therapy as prescribed as well as aggressive management of hyperlipidemia. Exercise is again strongly encouraged.   However, endografts require continued surveillance with ultrasound or CT scan. This is mandatory to detect any changes that allow repressurization of the aneurysm sac.  The patient is informed that this would be asymptomatic.  The patient is reminded that lifelong routine surveillance is a necessity with an endograft. Patient will continue to follow-up at 6 month intervals with ultrasound of the aorta.  2. Essential hypertension Continue antihypertensive medications as already ordered, these medications have been reviewed and there are no changes at this time.   3. Femoral artery aneurysm, left (HCC) No surgery or intervention at this time.  The patient has an asymptomatic femoral aneurysm that is less than 3 cm in maximal diameter.  I have discussed the natural history of femoral aneurysm and the small risk of rupture for aneurysm less than 3 cm in size.  However, as these small aneurysms tend to enlarge over time, continued surveillance with ultrasound or CT scan is mandatory.   I have also discussed optimizing medical management with hypertension and lipid control and the importance of abstinence from tobacco.  The patient is also  encouraged to exercise a minimum of 30 minutes 4 times a week.   Should the patient develop new onset abdominal or back pain or signs of peripheral embolization they are instructed to seek medical attention immediately and to alert the physician providing care that they have an aneurysm.   The patient voices their understanding. The patient will return in 12 months with an aortic femoral duplex.   4. Centrilobular emphysema ([place]) Continue pulmonary medications and aerosols as already ordered, these medications have been reviewed and there are no changes at this time.    5. Mixed hyperlipidemia Continue statin as ordered and reviewed, no changes at this time     Hortencia Pilar, MD  06/01/2017 4:06 PM

## 2017-06-02 ENCOUNTER — Encounter (INDEPENDENT_AMBULATORY_CARE_PROVIDER_SITE_OTHER): Payer: Self-pay | Admitting: Vascular Surgery

## 2017-06-02 ENCOUNTER — Ambulatory Visit (INDEPENDENT_AMBULATORY_CARE_PROVIDER_SITE_OTHER): Payer: Medicare Other | Admitting: Vascular Surgery

## 2017-06-02 VITALS — BP 121/78 | HR 87 | Resp 16 | Ht 67.0 in | Wt 229.0 lb

## 2017-06-02 DIAGNOSIS — E782 Mixed hyperlipidemia: Secondary | ICD-10-CM

## 2017-06-02 DIAGNOSIS — I724 Aneurysm of artery of lower extremity: Secondary | ICD-10-CM | POA: Diagnosis not present

## 2017-06-02 DIAGNOSIS — I10 Essential (primary) hypertension: Secondary | ICD-10-CM | POA: Diagnosis not present

## 2017-06-02 DIAGNOSIS — J432 Centrilobular emphysema: Secondary | ICD-10-CM

## 2017-06-02 DIAGNOSIS — I714 Abdominal aortic aneurysm, without rupture, unspecified: Secondary | ICD-10-CM

## 2017-06-06 ENCOUNTER — Ambulatory Visit (INDEPENDENT_AMBULATORY_CARE_PROVIDER_SITE_OTHER): Payer: Medicare Other | Admitting: Vascular Surgery

## 2017-06-09 DIAGNOSIS — Z23 Encounter for immunization: Secondary | ICD-10-CM | POA: Diagnosis not present

## 2017-06-10 ENCOUNTER — Other Ambulatory Visit: Payer: Self-pay

## 2017-06-17 ENCOUNTER — Other Ambulatory Visit: Payer: Self-pay | Admitting: *Deleted

## 2017-06-17 DIAGNOSIS — C61 Malignant neoplasm of prostate: Secondary | ICD-10-CM

## 2017-06-17 MED ORDER — TAMSULOSIN HCL 0.4 MG PO CAPS
0.4000 mg | ORAL_CAPSULE | Freq: Every day | ORAL | 3 refills | Status: DC
Start: 2017-06-17 — End: 2018-03-17

## 2017-06-20 ENCOUNTER — Ambulatory Visit: Payer: Medicare Other | Admitting: Urology

## 2017-08-03 DIAGNOSIS — H401131 Primary open-angle glaucoma, bilateral, mild stage: Secondary | ICD-10-CM | POA: Diagnosis not present

## 2017-08-09 ENCOUNTER — Ambulatory Visit: Payer: Medicare Other | Admitting: Urology

## 2017-08-09 DIAGNOSIS — H401131 Primary open-angle glaucoma, bilateral, mild stage: Secondary | ICD-10-CM | POA: Diagnosis not present

## 2017-08-11 ENCOUNTER — Encounter: Payer: Self-pay | Admitting: Urology

## 2017-08-11 ENCOUNTER — Ambulatory Visit (INDEPENDENT_AMBULATORY_CARE_PROVIDER_SITE_OTHER): Payer: Medicare Other | Admitting: Urology

## 2017-08-11 VITALS — BP 106/67 | HR 91 | Ht 67.0 in | Wt 220.0 lb

## 2017-08-11 DIAGNOSIS — C61 Malignant neoplasm of prostate: Secondary | ICD-10-CM

## 2017-08-11 DIAGNOSIS — M25519 Pain in unspecified shoulder: Secondary | ICD-10-CM | POA: Insufficient documentation

## 2017-08-11 DIAGNOSIS — R3915 Urgency of urination: Secondary | ICD-10-CM | POA: Diagnosis not present

## 2017-08-11 NOTE — Progress Notes (Signed)
08/11/2017 3:55 PM   Howard Anderson [birth year] [id number]  Referring provider: Juline Patch, MD [address] [place], [place] [address]  Chief Complaint  Patient presents with  . Prostate Cancer    HPI: 72 year old male who completed IMRT for intermediate risk prostate cancer in September 2017.  He last saw Dr. Donella Stade in August 2018 and PSA at that time was 0.72 which was his nadir.  He complains of urinary urgency and frequency which has been stable.  He is on tamsulosin.  He has nocturia x2 denies dysuria or gross hematuria.  Denies flank, abdominal, pelvic or scrotal pain.[name]   PMH: Past Medical History:  Diagnosis Date  . AAA (abdominal aortic aneurysm) without rupture ([place])   . AAA (abdominal aortic aneurysm) without rupture ([place])   . Cancer [facility])    prostate cancer  . Chest pain   . COPD (chronic obstructive pulmonary disease) ([place])   . Dizziness and giddiness   . GERD (gastroesophageal reflux disease)   . Heart disease   . Hyperlipidemia   . Hypertension   . Prostate cancer ([place])   . Shortness of breath dyspnea   . Shoulder fracture, right   . Stroke [facility])    tia    Surgical History: Past Surgical History:  Procedure Laterality Date  . COLONOSCOPY  2010  . EMBOLECTOMY Left 06/20/2015   Procedure: EMBOLECTOMY;  Surgeon: [name], MD;  Location: [facility] ORS;  Service: Vascular;  Laterality: Left;  brachial thrombectomy  . ENDARTERECTOMY FEMORAL Left 06/20/2015   Procedure: ENDARTERECTOMY FEMORAL/FEMORAL PSEUDOANEURYSM repair;  Surgeon: [name], MD;  Location: [facility] ORS;  Service: Vascular;  Laterality: Left;  femerol  . INNER EAR SURGERY    . PERIPHERAL VASCULAR CATHETERIZATION N/A 05/21/2015   Procedure: Endovascular Repair/Stent Graft;  Surgeon: [name], MD;  Location: [place] CV LAB;  Service: Cardiovascular;  Laterality: N/A;  . PERIPHERAL VASCULAR CATHETERIZATION  06/20/2015   Procedure: Lower Extremity  Angiography;  Surgeon: [name], MD;  Location: [place] CV LAB;  Service: Cardiovascular;;  . PERIPHERAL VASCULAR CATHETERIZATION  06/20/2015   Procedure: Lower Extremity Intervention;  Surgeon: [name], MD;  Location: [place] CV LAB;  Service: Cardiovascular;;    Home Medications:  Allergies as of 08/11/2017      Reactions   No Known Allergies       Medication List        Accurate as of 08/11/17  3:55 PM. Always use your most recent med list.          aspirin 81 MG tablet Take 1 tablet (81 mg total) by mouth daily.   atorvastatin 10 MG tablet Commonly known as:  LIPITOR Take 1 tablet (10 mg total) by mouth daily.   CYSTEX Liqd Take 1 Dose by mouth.   dorzolamide 2 % ophthalmic solution Commonly known as:  TRUSOPT INSTILL 1 DROP BOTH EYES TWO TIMES A DAY   doxycycline 20 MG tablet Commonly known as:  PERIOSTAT Take 20 mg by mouth 2 (two) times daily.   Fluticasone-Salmeterol 250-50 MCG/DOSE Aepb Commonly known as:  ADVAIR DISKUS INHALE 1 PUFF INTO THE LUNGS 2 TIMES DAILY   latanoprost 0.005 % ophthalmic solution Commonly known as:  XALATAN 1 drop at bedtime.   omega-3 acid ethyl esters 1 g capsule Commonly known as:  LOVAZA TAKE 1 CAPSULE (1 G TOTAL) BY MOUTH 2 (TWO) TIMES DAILY.   PRESERVISION AREDS PO Take 1 capsule by mouth  2 (two) times daily.   ranitidine 150 MG tablet Commonly known as:  ZANTAC Take 0.5 tablets (75 mg total) by mouth daily.   SYSTANE BALANCE 0.6 % Soln Generic drug:  Propylene Glycol Apply 1 drop to eye 2 (two) times daily.   tamsulosin 0.4 MG Caps capsule Commonly known as:  FLOMAX Take 1 capsule (0.4 mg total) daily by mouth.   valsartan 80 MG tablet Commonly known as:  DIOVAN TAKE 1 TABLET (80 MG TOTAL) BY MOUTH ONCE DAILY.   Vitamin D3 1000 units Caps Take 1 capsule by mouth 2 (two) times daily.       Allergies:  Allergies  Allergen Reactions  . No Known Allergies     Family  History: Family History  Problem Relation Age of Onset  . Prostate cancer Brother   . Bladder Cancer Neg Hx   . Kidney cancer Neg Hx     Social History:  reports that he quit smoking about 3 years ago. He has a 72.00 pack-year smoking history. he has never used smokeless tobacco. He reports that he does not drink alcohol or use drugs.  ROS: UROLOGY Frequent Urination?: Yes Hard to postpone urination?: No Burning/pain with urination?: No Get up at night to urinate?: Yes Leakage of urine?: Yes Urine stream starts and stops?: No Trouble starting stream?: No Do you have to strain to urinate?: No Blood in urine?: No Urinary tract infection?: No Sexually transmitted disease?: No Injury to kidneys or bladder?: No Painful intercourse?: No Weak stream?: No Erection problems?: No Penile pain?: No  Gastrointestinal Nausea?: No Vomiting?: No Indigestion/heartburn?: Yes Diarrhea?: No Constipation?: No  Constitutional Fever: No Night sweats?: No Weight loss?: No Fatigue?: No  Skin Skin rash/lesions?: Yes Itching?: No  Eyes Blurred vision?: No Double vision?: No  Ears/Nose/Throat Sore throat?: No Sinus problems?: No  Hematologic/Lymphatic Swollen glands?: No Easy bruising?: Yes  Cardiovascular Leg swelling?: No Chest pain?: No  Respiratory Cough?: No Shortness of breath?: Yes  Endocrine Excessive thirst?: No  Musculoskeletal Back pain?: No Joint pain?: No  Neurological Headaches?: No Dizziness?: Yes  Psychologic Depression?: No Anxiety?: No  Physical Exam: BP 106/67   Pulse 91   Ht 5\' 7"  (1.702 m)   Wt 220 lb (99.8 kg)   BMI 34.46 kg/m   Constitutional:  Alert and oriented, No acute distress. HEENT: [place] AT, moist mucus membranes.  Trachea midline, no masses. Cardiovascular: No clubbing, cyanosis, or edema. Respiratory: Normal respiratory effort, no increased work of breathing. GI: Abdomen is soft, nontender, nondistended, no abdominal  masses GU: No CVA tenderness.  Skin: No rashes, bruises or suspicious lesions. Lymph: No cervical or inguinal adenopathy. Neurologic: Grossly intact, no focal deficits, moving all 4 extremities. Psychiatric: Normal mood and affect.  Laboratory Data: Lab Results  Component Value Date   WBC 5.8 03/09/2016   HGB 14.6 03/09/2016   HCT 43.3 03/09/2016   MCV 88.2 03/09/2016   PLT 263 03/09/2016    Lab Results  Component Value Date   CREATININE 1.00 09/27/2016    Lab Results  Component Value Date   PSA1 7.5 (H) 04/24/2015     Assessment & Plan:    1. Prostate cancer ([place]) PSA drawn today.  He has an appointment with Dr. Donella Stade September 2019 and will see him back March 2020 - PSA  2. Urinary urgency Trial of Myrbetriq 25 mg daily.  He was given samples and was instructed to call back regarding efficacy.   Return in about 14 months (  around 10/10/2018).  Abbie Sons, [place] [address] [place], [place] [address] [phone]

## 2017-08-12 ENCOUNTER — Telehealth: Payer: Self-pay

## 2017-08-12 LAB — PSA: Prostate Specific Ag, Serum: 0.6 ng/mL (ref 0.0–4.0)

## 2017-08-12 NOTE — Telephone Encounter (Signed)
-----   Message from Abbie Sons, MD sent at 08/12/2017 12:41 PM EST ----- PSA looks good at 0.6

## 2017-08-12 NOTE — Telephone Encounter (Signed)
Patient notified on vmail 

## 2017-08-15 ENCOUNTER — Encounter: Payer: Self-pay | Admitting: Urology

## 2017-08-24 ENCOUNTER — Other Ambulatory Visit: Payer: Self-pay

## 2017-09-09 ENCOUNTER — Telehealth: Payer: Self-pay | Admitting: Urology

## 2017-09-09 NOTE — Telephone Encounter (Signed)
Pt's wife came in and said pt has tried Myrbetriq 25 mg samples and would like an Rx called in to [facility] on [address].  Please give pt a call at [phone]

## 2017-09-13 MED ORDER — MIRABEGRON ER 25 MG PO TB24: 25.0000 mg | ORAL_TABLET | Freq: Every day | ORAL | 3 refills | Status: DC

## 2017-09-13 NOTE — Telephone Encounter (Signed)
Script sent to pharmacy.

## 2017-09-19 DIAGNOSIS — H353132 Nonexudative age-related macular degeneration, bilateral, intermediate dry stage: Secondary | ICD-10-CM | POA: Diagnosis not present

## 2017-10-19 ENCOUNTER — Other Ambulatory Visit: Payer: Self-pay | Admitting: Family Medicine

## 2017-10-19 DIAGNOSIS — J432 Centrilobular emphysema: Secondary | ICD-10-CM

## 2017-10-27 ENCOUNTER — Other Ambulatory Visit: Payer: Self-pay | Admitting: Family Medicine

## 2017-10-27 DIAGNOSIS — K219 Gastro-esophageal reflux disease without esophagitis: Secondary | ICD-10-CM

## 2017-10-29 ENCOUNTER — Other Ambulatory Visit: Payer: Self-pay | Admitting: Family Medicine

## 2017-10-29 DIAGNOSIS — J432 Centrilobular emphysema: Secondary | ICD-10-CM

## 2017-11-01 ENCOUNTER — Other Ambulatory Visit: Payer: Self-pay | Admitting: Family Medicine

## 2017-11-01 DIAGNOSIS — E782 Mixed hyperlipidemia: Secondary | ICD-10-CM

## 2017-11-28 DIAGNOSIS — R600 Localized edema: Secondary | ICD-10-CM | POA: Diagnosis not present

## 2017-11-28 DIAGNOSIS — I10 Essential (primary) hypertension: Secondary | ICD-10-CM | POA: Diagnosis not present

## 2017-11-28 DIAGNOSIS — I714 Abdominal aortic aneurysm, without rupture: Secondary | ICD-10-CM | POA: Diagnosis not present

## 2017-11-28 DIAGNOSIS — E782 Mixed hyperlipidemia: Secondary | ICD-10-CM | POA: Diagnosis not present

## 2017-11-30 ENCOUNTER — Encounter: Payer: Self-pay | Admitting: *Deleted

## 2017-12-01 ENCOUNTER — Other Ambulatory Visit: Payer: Self-pay | Admitting: Family Medicine

## 2017-12-01 DIAGNOSIS — E782 Mixed hyperlipidemia: Secondary | ICD-10-CM

## 2017-12-08 ENCOUNTER — Ambulatory Visit (INDEPENDENT_AMBULATORY_CARE_PROVIDER_SITE_OTHER): Payer: Medicare Other | Admitting: Family Medicine

## 2017-12-08 ENCOUNTER — Encounter: Payer: Self-pay | Admitting: Family Medicine

## 2017-12-08 VITALS — BP 120/64 | HR 80 | Ht 67.0 in | Wt 228.0 lb

## 2017-12-08 DIAGNOSIS — E782 Mixed hyperlipidemia: Secondary | ICD-10-CM | POA: Diagnosis not present

## 2017-12-08 DIAGNOSIS — K219 Gastro-esophageal reflux disease without esophagitis: Secondary | ICD-10-CM | POA: Diagnosis not present

## 2017-12-08 DIAGNOSIS — J432 Centrilobular emphysema: Secondary | ICD-10-CM

## 2017-12-08 MED ORDER — ATORVASTATIN CALCIUM 10 MG PO TABS: ORAL_TABLET | ORAL | 1 refills | Status: DC

## 2017-12-08 MED ORDER — FLUTICASONE-SALMETEROL 250-50 MCG/DOSE IN AEPB: INHALATION_SPRAY | RESPIRATORY_TRACT | 11 refills | Status: DC

## 2017-12-08 MED ORDER — RANITIDINE HCL 150 MG PO TABS: 75.0000 mg | ORAL_TABLET | Freq: Every day | ORAL | 1 refills | Status: DC

## 2017-12-08 NOTE — Progress Notes (Signed)
Name: Howard Anderson   MRN: [id number]    DOB: 01/16/1946   Date:12/08/2017       Progress Note  Subjective  Chief Complaint  Chief Complaint  Patient presents with  . Hyperlipidemia  . Gastroesophageal Reflux  . COPD    Hyperlipidemia  This is a chronic problem. The current episode started more than 1 year ago. The problem is controlled. Recent lipid tests were reviewed and are normal. He has no history of chronic renal disease, diabetes, hypothyroidism, liver disease, obesity or nephrotic syndrome. Pertinent negatives include no chest pain, focal sensory loss, focal weakness, leg pain, myalgias or shortness of breath. Current antihyperlipidemic treatment includes statins. The current treatment provides moderate improvement of lipids. There are no compliance problems.  Risk factors for coronary artery disease include dyslipidemia and hypertension.  Gastroesophageal Reflux  He reports no abdominal pain, no belching, no chest pain, no choking, no coughing, no dysphagia, no early satiety, no globus sensation, no heartburn, no hoarse voice, no nausea, no sore throat, no stridor or no wheezing. This is a chronic problem. The current episode started more than 1 year ago. The problem occurs occasionally. The problem has been waxing and waning. The symptoms are aggravated by certain foods. Pertinent negatives include no anemia, fatigue, melena, muscle weakness, orthopnea or weight loss. Risk factors include obesity. He has tried a histamine-2 antagonist for the symptoms. The treatment provided moderate relief.  COPD  There is no cough, hoarse voice, shortness of breath, sputum production or wheezing. This is a recurrent problem. The current episode started more than 1 year ago. The problem has been gradually improving. Pertinent negatives include no chest pain, ear pain, fever, headaches, heartburn, malaise/fatigue, myalgias, orthopnea, sore throat or weight loss. His past medical history is significant for  COPD.    No problem-specific Assessment & Plan notes found for this encounter.   Past Medical History:  Diagnosis Date  . AAA (abdominal aortic aneurysm) without rupture ([place])   . AAA (abdominal aortic aneurysm) without rupture ([place])   . Cancer [facility])    prostate cancer  . Chest pain   . COPD (chronic obstructive pulmonary disease) ([place])   . Dizziness and giddiness   . GERD (gastroesophageal reflux disease)   . Heart disease   . Hyperlipidemia   . Hypertension   . Prostate cancer ([place])   . Shortness of breath dyspnea   . Shoulder fracture, right   . Stroke [facility])    tia    Past Surgical History:  Procedure Laterality Date  . COLONOSCOPY  2010  . EMBOLECTOMY Left 06/20/2015   Procedure: EMBOLECTOMY;  Surgeon: [name], MD;  Location: [facility] ORS;  Service: Vascular;  Laterality: Left;  brachial thrombectomy  . ENDARTERECTOMY FEMORAL Left 06/20/2015   Procedure: ENDARTERECTOMY FEMORAL/FEMORAL PSEUDOANEURYSM repair;  Surgeon: [name], MD;  Location: [facility] ORS;  Service: Vascular;  Laterality: Left;  femerol  . INNER EAR SURGERY    . PERIPHERAL VASCULAR CATHETERIZATION N/A 05/21/2015   Procedure: Endovascular Repair/Stent Graft;  Surgeon: [name], MD;  Location: [place] [facility];  Service: Cardiovascular;  Laterality: N/A;  . PERIPHERAL VASCULAR CATHETERIZATION  06/20/2015   Procedure: Lower Extremity Angiography;  Surgeon: [name], MD;  Location: [place] [facility];  Service: Cardiovascular;;  . PERIPHERAL VASCULAR CATHETERIZATION  06/20/2015   Procedure: Lower Extremity Intervention;  Surgeon: [name], MD;  Location: [place] CV [facility];  Service: Cardiovascular;;    Family History  Problem  Relation Age of Onset  . Prostate cancer Brother   . Bladder Cancer Neg Hx   . Kidney cancer Neg Hx     Social History   Socioeconomic History  . Marital status: Married    Spouse name: Not on file  . Number of children: Not on  file  . Years of education: Not on file  . Highest education level: Not on file  Occupational History  . Not on file  Social Needs  . Financial resource strain: Not on file  . Food insecurity:    Worry: Not on file    Inability: Not on file  . Transportation needs:    Medical: Not on file    Non-medical: Not on file  Tobacco Use  . Smoking status: Former Smoker    Packs/day: 1.50    Years: 48.00    Pack years: 72.00    Last attempt to quit: 10/10/2013    Years since quitting: 4.1  . Smokeless tobacco: Never Used  Substance and Sexual Activity  . Alcohol use: No  . Drug use: No  . Sexual activity: Not on file  Lifestyle  . Physical activity:    Days per week: Not on file    Minutes per session: Not on file  . Stress: Not on file  Relationships  . Social connections:    Talks on phone: Not on file    Gets together: Not on file    Attends religious service: Not on file    Active member of club or organization: Not on file    Attends meetings of clubs or organizations: Not on file    Relationship status: Not on file  . Intimate partner violence:    Fear of current or ex partner: Not on file    Emotionally abused: Not on file    Physically abused: Not on file    Forced sexual activity: Not on file  Other Topics Concern  . Not on file  Social History Narrative  . Not on file    Allergies  Allergen Reactions  . No Known Allergies     Outpatient Medications Prior to Visit  Medication Sig Dispense Refill  . aspirin 81 MG tablet Take 1 tablet (81 mg total) by mouth daily. 30 tablet 11  . Cholecalciferol (VITAMIN D3) 1000 UNITS CAPS Take 1 capsule by mouth 2 (two) times daily.    . dorzolamide (TRUSOPT) 2 % ophthalmic solution INSTILL 1 DROP BOTH EYES TWO TIMES A DAY  3  . doxycycline (PERIOSTAT) 20 MG tablet Take 20 mg by mouth 2 (two) times daily.    [name] latanoprost (XALATAN) 0.005 % ophthalmic solution 1 drop at bedtime.     . mirabegron ER (MYRBETRIQ) 25 MG TB24  tablet Take 1 tablet (25 mg total) by mouth daily. 30 tablet 3  . Misc Natural Products (CYSTEX) LIQD Take 1 Dose by mouth.    . Multiple Vitamins-Minerals (PRESERVISION AREDS PO) Take 1 capsule by mouth 2 (two) times daily.    [name] Propylene Glycol (SYSTANE BALANCE) 0.6 % SOLN Apply 1 drop to eye 2 (two) times daily.    . tamsulosin (FLOMAX) 0.4 MG CAPS capsule Take 1 capsule (0.4 mg total) daily by mouth. 90 capsule 3  . valsartan (DIOVAN) 80 MG tablet TAKE 1 TABLET (80 MG TOTAL) BY MOUTH ONCE DAILY.  11  . atorvastatin (LIPITOR) 10 MG tablet TAKE 1 TABLET BY MOUTH EVERY DAY-NEEDS APPT 15 tablet 0  . ranitidine (ZANTAC) 150 MG  tablet TAKE 0.5 TABLETS (75 MG TOTAL) BY MOUTH DAILY. 30 tablet 0  . WIXELA INHUB 250-50 MCG/DOSE AEPB TAKE 1 PUFF BY MOUTH TWICE A DAY 14 each 0  . omega-3 acid ethyl esters (LOVAZA) 1 g capsule TAKE 1 CAPSULE (1 G TOTAL) BY MOUTH 2 (TWO) TIMES DAILY. 180 capsule 0   No facility-administered medications prior to visit.     Review of Systems  Constitutional: Negative for chills, fatigue, fever, malaise/fatigue and weight loss.  HENT: Negative for ear discharge, ear pain, hoarse voice and sore throat.   Eyes: Negative for blurred vision.  Respiratory: Negative for cough, sputum production, choking, shortness of breath and wheezing.   Cardiovascular: Negative for chest pain, palpitations and leg swelling.  Gastrointestinal: Negative for abdominal pain, blood in stool, constipation, diarrhea, dysphagia, heartburn, melena and nausea.  Genitourinary: Negative for dysuria, frequency, hematuria and urgency.  Musculoskeletal: Negative for back pain, joint pain, myalgias, muscle weakness and neck pain.  Skin: Negative for rash.  Neurological: Negative for dizziness, tingling, sensory change, focal weakness and headaches.  Endo/Heme/Allergies: Negative for environmental allergies and polydipsia. Does not bruise/bleed easily.  Psychiatric/Behavioral: Negative for depression and  suicidal ideas. The patient is not nervous/anxious and does not have insomnia.      Objective  Vitals:   12/08/17 1015  BP: 120/64  Pulse: 80  Weight: 228 lb (103.4 kg)  Height: 5\' 7"  (1.702 m)    Physical Exam  Constitutional: He is oriented to person, place, and time.  HENT:  Head: Normocephalic.  Right Ear: External ear normal.  Left Ear: External ear normal.  Nose: Nose normal.  Mouth/Throat: Oropharynx is clear and moist.  Eyes: Pupils are equal, round, and reactive to light. Conjunctivae and EOM are normal. Right eye exhibits no discharge. Left eye exhibits no discharge. No scleral icterus.  Neck: Normal range of motion. Neck supple. No JVD present. No tracheal deviation present. No thyromegaly present.  Cardiovascular: Normal rate, regular rhythm, normal heart sounds and intact distal pulses. Exam reveals no gallop and no friction rub.  No murmur heard. Pulmonary/Chest: Breath sounds normal. No respiratory distress. He has no wheezes. He has no rales.  Abdominal: Soft. Bowel sounds are normal. He exhibits no mass. There is no hepatosplenomegaly. There is no tenderness. There is no rebound, no guarding and no CVA tenderness.  Musculoskeletal: Normal range of motion. He exhibits no edema or tenderness.  Lymphadenopathy:    He has no cervical adenopathy.  Neurological: He is alert and oriented to person, place, and time. He has normal strength and normal reflexes. No cranial nerve deficit.  Skin: Skin is warm. No rash noted.  Nursing note and vitals reviewed.     Assessment & Plan  Problem List Items Addressed This Visit      Respiratory   Centrilobular emphysema (HCC)   Relevant Medications   Fluticasone-Salmeterol (WIXELA INHUB) 250-50 MCG/DOSE AEPB     Digestive   Esophageal reflux   Relevant Medications   ranitidine (ZANTAC) 150 MG tablet     Other   Hyperlipidemia - Primary   Relevant Medications   atorvastatin (LIPITOR) 10 MG tablet   Other Relevant  Orders   Lipid panel      Meds ordered this encounter  Medications  . Fluticasone-Salmeterol (WIXELA INHUB) 250-50 MCG/DOSE AEPB    Sig: TAKE 1 PUFF BY MOUTH TWICE A DAY    Dispense:  14 each    Refill:  11    DX Code Needed  THIS  IS ADVAIR 250-50.  (NOTE TO PHARMACY: FILL AS [place].).  [name] ranitidine (ZANTAC) 150 MG tablet    Sig: Take 0.5 tablets (75 mg total) by mouth daily.    Dispense:  90 tablet    Refill:  1  . atorvastatin (LIPITOR) 10 MG tablet    Sig: TAKE 1 TABLET BY MOUTH EVERY DAY-NEEDS APPT    Dispense:  90 tablet    Refill:  1      Dr. On[facility]  12/08/17

## 2017-12-09 LAB — LIPID PANEL
Chol/HDL Ratio: 2.3 ratio (ref 0.0–5.0)
Cholesterol, Total: 102 mg/dL (ref 100–199)
HDL: 44 mg/dL (ref >39)
LDL Calculated: 45 mg/dL (ref 0–99)
Triglycerides: 67 mg/dL (ref 0–149)
VLDL Cholesterol Cal: 13 mg/dL (ref 5–40)

## 2018-01-16 DIAGNOSIS — H3554 Dystrophies primarily involving the retinal pigment epithelium: Secondary | ICD-10-CM | POA: Diagnosis not present

## 2018-02-06 DIAGNOSIS — H353132 Nonexudative age-related macular degeneration, bilateral, intermediate dry stage: Secondary | ICD-10-CM | POA: Diagnosis not present

## 2018-03-17 ENCOUNTER — Encounter: Payer: Self-pay | Admitting: Family Medicine

## 2018-03-17 ENCOUNTER — Ambulatory Visit (INDEPENDENT_AMBULATORY_CARE_PROVIDER_SITE_OTHER): Payer: Medicare Other | Admitting: Family Medicine

## 2018-03-17 VITALS — BP 124/74 | HR 81 | Ht 67.0 in | Wt 211.0 lb

## 2018-03-17 DIAGNOSIS — I714 Abdominal aortic aneurysm, without rupture, unspecified: Secondary | ICD-10-CM

## 2018-03-17 DIAGNOSIS — C61 Malignant neoplasm of prostate: Secondary | ICD-10-CM

## 2018-03-17 DIAGNOSIS — W19XXXA Unspecified fall, initial encounter: Secondary | ICD-10-CM

## 2018-03-17 DIAGNOSIS — K219 Gastro-esophageal reflux disease without esophagitis: Secondary | ICD-10-CM | POA: Diagnosis not present

## 2018-03-17 DIAGNOSIS — R270 Ataxia, unspecified: Secondary | ICD-10-CM | POA: Diagnosis not present

## 2018-03-17 DIAGNOSIS — K0530 Chronic periodontitis, unspecified: Secondary | ICD-10-CM | POA: Diagnosis not present

## 2018-03-17 DIAGNOSIS — E782 Mixed hyperlipidemia: Secondary | ICD-10-CM | POA: Diagnosis not present

## 2018-03-17 DIAGNOSIS — I10 Essential (primary) hypertension: Secondary | ICD-10-CM

## 2018-03-17 DIAGNOSIS — I679 Cerebrovascular disease, unspecified: Secondary | ICD-10-CM

## 2018-03-17 DIAGNOSIS — Z7189 Other specified counseling: Secondary | ICD-10-CM | POA: Diagnosis not present

## 2018-03-17 DIAGNOSIS — Z23 Encounter for immunization: Secondary | ICD-10-CM | POA: Diagnosis not present

## 2018-03-17 MED ORDER — DOXYCYCLINE HYCLATE 20 MG PO TABS: 20.0000 mg | ORAL_TABLET | Freq: Two times a day (BID) | ORAL | 2 refills | Status: DC

## 2018-03-17 MED ORDER — VALSARTAN 80 MG PO TABS: ORAL_TABLET | ORAL | 3 refills | Status: AC

## 2018-03-17 MED ORDER — RANITIDINE HCL 150 MG PO TABS: 75.0000 mg | ORAL_TABLET | Freq: Every day | ORAL | 1 refills | Status: DC

## 2018-03-17 MED ORDER — TAMSULOSIN HCL 0.4 MG PO CAPS: 0.4000 mg | ORAL_CAPSULE | Freq: Every day | ORAL | 3 refills | Status: DC

## 2018-03-17 MED ORDER — ASPIRIN 81 MG PO TABS: 81.0000 mg | ORAL_TABLET | Freq: Every day | ORAL | 11 refills | Status: AC

## 2018-03-17 MED ORDER — ATORVASTATIN CALCIUM 10 MG PO TABS: ORAL_TABLET | ORAL | 1 refills | Status: DC

## 2018-03-17 NOTE — Addendum Note (Signed)
Addended by: Juline Patch on: 03/17/2018 05:18 PM   Modules accepted: Orders

## 2018-03-17 NOTE — Progress Notes (Signed)
Name: Howard Anderson   MRN: [id number]    DOB: Mar 13, 1946   Date:03/17/2018       Progress Note  Subjective  Chief Complaint  No chief complaint on file.   Patient presents to clarify medications.   Fall  The accident occurred 3 to 6 hours ago. Fall occurred: in bathroom/  He landed on hard floor. The pain is present in the head and right hip. The symptoms are aggravated by ambulation (unable to lift right foot). Pertinent negatives include no abdominal pain, bowel incontinence, fever, headaches, hearing loss, hematuria, loss of consciousness, nausea, numbness, tingling, visual change or vomiting. He has tried nothing for the symptoms.  Neurologic Problem  The patient's primary symptoms include clumsiness and a loss of balance. The patient's pertinent negatives include no altered mental status, focal sensory loss, focal weakness, memory loss, near-syncope, slurred speech, syncope, visual change or weakness. This is a new problem. The current episode started more than 1 month ago. The neurological problem developed insidiously (also has underlying chronic vertigo). The problem has been gradually worsening since onset. There was no focality noted. Associated symptoms include vertigo. Pertinent negatives include no abdominal pain, back pain, bowel incontinence, chest pain, dizziness, fever, headaches, nausea, neck pain, palpitations, shortness of breath or vomiting.    No problem-specific Assessment & Plan notes found for this encounter.   Past Medical History:  Diagnosis Date  . AAA (abdominal aortic aneurysm) without rupture ([place])   . AAA (abdominal aortic aneurysm) without rupture ([place])   . Cancer [facility])    prostate cancer  . Chest pain   . COPD (chronic obstructive pulmonary disease) ([place])   . Dizziness and giddiness   . GERD (gastroesophageal reflux disease)   . Heart disease   . Hyperlipidemia   . Hypertension   . Prostate cancer ([place])   . Shortness of breath dyspnea   . Shoulder  fracture, right   . Stroke [facility])    tia    Past Surgical History:  Procedure Laterality Date  . COLONOSCOPY  2010  . EMBOLECTOMY Left 06/20/2015   Procedure: EMBOLECTOMY;  Surgeon: [name], MD;  Location: [facility] ORS;  Service: Vascular;  Laterality: Left;  brachial thrombectomy  . ENDARTERECTOMY FEMORAL Left 06/20/2015   Procedure: ENDARTERECTOMY FEMORAL/FEMORAL PSEUDOANEURYSM repair;  Surgeon: [name], MD;  Location: [facility] ORS;  Service: Vascular;  Laterality: Left;  femerol  . INNER EAR SURGERY    . PERIPHERAL VASCULAR CATHETERIZATION N/A 05/21/2015   Procedure: Endovascular Repair/Stent Graft;  Surgeon: [name], MD;  Location: [place] [facility];  Service: Cardiovascular;  Laterality: N/A;  . PERIPHERAL VASCULAR CATHETERIZATION  06/20/2015   Procedure: Lower Extremity Angiography;  Surgeon: [name], MD;  Location: [place] CV [facility];  Service: Cardiovascular;;  . PERIPHERAL VASCULAR CATHETERIZATION  06/20/2015   Procedure: Lower Extremity Intervention;  Surgeon: [name], MD;  Location: [place] [facility];  Service: Cardiovascular;;    Family History  Problem Relation Age of Onset  . Prostate cancer Brother   . Bladder Cancer Neg Hx   . Kidney cancer Neg Hx     Social History   Socioeconomic History  . Marital status: Married    Spouse name: Not on file  . Number of children: Not on file  . Years of education: Not on file  . Highest education level: Not on file  Occupational History  . Not on file  Social Needs  . Financial resource strain: Not on  file  . Food insecurity:    Worry: Not on file    Inability: Not on file  . Transportation needs:    Medical: Not on file    Non-medical: Not on file  Tobacco Use  . Smoking status: Former Smoker    Packs/day: 1.50    Years: 48.00    Pack years: 72.00    Last attempt to quit: 10/10/2013    Years since quitting: 4.4  . Smokeless tobacco: Never Used  Substance and  Sexual Activity  . Alcohol use: No  . Drug use: No  . Sexual activity: Not on file  Lifestyle  . Physical activity:    Days per week: Not on file    Minutes per session: Not on file  . Stress: Not on file  Relationships  . Social connections:    Talks on phone: Not on file    Gets together: Not on file    Attends religious service: Not on file    Active member of club or organization: Not on file    Attends meetings of clubs or organizations: Not on file    Relationship status: Not on file  . Intimate partner violence:    Fear of current or ex partner: Not on file    Emotionally abused: Not on file    Physically abused: Not on file    Forced sexual activity: Not on file  Other Topics Concern  . Not on file  Social History Narrative  . Not on file    Allergies  Allergen Reactions  . No Known Allergies     Outpatient Medications Prior to Visit  Medication Sig Dispense Refill  . atorvastatin (LIPITOR) 10 MG tablet TAKE 1 TABLET BY MOUTH EVERY DAY-NEEDS APPT 90 tablet 1  . dorzolamide (TRUSOPT) 2 % ophthalmic solution INSTILL 1 DROP BOTH EYES TWO TIMES A DAY  3  . doxycycline (PERIOSTAT) 20 MG tablet Take 20 mg by mouth 2 (two) times daily.    . Fluticasone-Salmeterol (WIXELA INHUB) 250-50 MCG/DOSE AEPB TAKE 1 PUFF BY MOUTH TWICE A DAY 14 each 11  . latanoprost (XALATAN) 0.005 % ophthalmic solution 1 drop at bedtime.     . Misc Natural Products (CYSTEX) LIQD Take 1 Dose by mouth.    . Multiple Vitamins-Minerals (PRESERVISION AREDS PO) Take 1 capsule by mouth 2 (two) times daily.    [name] Propylene Glycol (SYSTANE BALANCE) 0.6 % SOLN Apply 1 drop to eye 2 (two) times daily.    . ranitidine (ZANTAC) 150 MG tablet Take 0.5 tablets (75 mg total) by mouth daily. 90 tablet 1  . valsartan (DIOVAN) 80 MG tablet TAKE 1 TABLET (80 MG TOTAL) BY MOUTH ONCE DAILY.  11  . aspirin 81 MG tablet Take 1 tablet (81 mg total) by mouth daily. 30 tablet 11  . Cholecalciferol (VITAMIN D3) 1000 UNITS  CAPS Take 1 capsule by mouth 2 (two) times daily.    . mirabegron ER (MYRBETRIQ) 25 MG TB24 tablet Take 1 tablet (25 mg total) by mouth daily. 30 tablet 3  . tamsulosin (FLOMAX) 0.4 MG CAPS capsule Take 1 capsule (0.4 mg total) daily by mouth. (Patient not taking: Reported on 03/17/2018) 90 capsule 3   No facility-administered medications prior to visit.     Review of Systems  Constitutional: Negative for chills, fever, malaise/fatigue and weight loss.  HENT: Negative for ear discharge, ear pain and sore throat.   Eyes: Negative for blurred vision.  Respiratory: Negative for cough, sputum production,  shortness of breath and wheezing.   Cardiovascular: Negative for chest pain, palpitations, leg swelling and near-syncope.  Gastrointestinal: Negative for abdominal pain, blood in stool, bowel incontinence, constipation, diarrhea, heartburn, melena, nausea and vomiting.  Genitourinary: Negative for dysuria, frequency, hematuria and urgency.  Musculoskeletal: Negative for back pain, joint pain, myalgias and neck pain.  Skin: Negative for rash.  Neurological: Positive for vertigo and loss of balance. Negative for dizziness, tingling, sensory change, focal weakness, loss of consciousness, syncope, weakness, numbness and headaches.  Endo/Heme/Allergies: Negative for environmental allergies and polydipsia. Does not bruise/bleed easily.  Psychiatric/Behavioral: Negative for depression, memory loss and suicidal ideas. The patient is not nervous/anxious and does not have insomnia.      Objective  Vitals:   03/17/18 1434  BP: 124/74  Pulse: 81  SpO2: 97%  Weight: 211 lb (95.7 kg)  Height: 5\' 7"  (1.702 m)    Physical Exam  Constitutional: He is oriented to person, place, and time.  HENT:  Head: Normocephalic.  Right Ear: External ear normal.  Left Ear: External ear normal.  Nose: Nose normal.  Mouth/Throat: Oropharynx is clear and moist.  Eyes: Pupils are equal, round, and reactive to  light. Conjunctivae and EOM are normal. Right eye exhibits no discharge. Left eye exhibits no discharge. No scleral icterus.  Neck: Normal range of motion. Neck supple. No JVD present. No tracheal deviation present. No thyromegaly present.  Cardiovascular: Normal rate, regular rhythm, normal heart sounds and intact distal pulses. Exam reveals no gallop and no friction rub.  No murmur heard. Pulmonary/Chest: Breath sounds normal. No respiratory distress. He has no wheezes. He has no rales.  Abdominal: Soft. Bowel sounds are normal. He exhibits no mass. There is no hepatosplenomegaly. There is no tenderness. There is no rebound, no guarding and no CVA tenderness.  Musculoskeletal: Normal range of motion. He exhibits no edema or tenderness.  Lymphadenopathy:    He has no cervical adenopathy.  Neurological: He is alert and oriented to person, place, and time. He has normal strength and normal reflexes. No cranial nerve deficit.  Skin: Skin is warm. No rash noted.  Nursing note and vitals reviewed.     Assessment & Plan  Problem List Items Addressed This Visit    None    Visit Diagnoses    Encounter for medication review and counseling    -  Primary   Call to [facility] for clarification and prescribing transfer. Confirmed medication . medication list updated.    Ataxia       Gradual/insidious onset of balance abnormality. Referral to neurology for evaluation and possible MRI.  ?Parkinsonism ? cerebellar atrophy   Fall, initial encounter       Acute Fall this Am with no residual signs or symptoms of concern.   Need for influenza vaccination       Relevant Orders   Flu Vaccine QUAD 36+ mos IM (Completed)      No orders of the defined types were placed in this encounter.     Dr. [facility]  03/17/18

## 2018-03-20 ENCOUNTER — Other Ambulatory Visit: Payer: Self-pay

## 2018-03-20 DIAGNOSIS — R270 Ataxia, unspecified: Secondary | ICD-10-CM

## 2018-03-20 DIAGNOSIS — I679 Cerebrovascular disease, unspecified: Secondary | ICD-10-CM

## 2018-03-20 DIAGNOSIS — W19XXXA Unspecified fall, initial encounter: Secondary | ICD-10-CM

## 2018-03-21 ENCOUNTER — Other Ambulatory Visit: Payer: Self-pay | Admitting: Neurology

## 2018-03-21 DIAGNOSIS — E538 Deficiency of other specified B group vitamins: Secondary | ICD-10-CM | POA: Diagnosis not present

## 2018-03-21 DIAGNOSIS — G20 Parkinson's disease: Secondary | ICD-10-CM

## 2018-03-21 DIAGNOSIS — H401131 Primary open-angle glaucoma, bilateral, mild stage: Secondary | ICD-10-CM | POA: Diagnosis not present

## 2018-03-21 DIAGNOSIS — E559 Vitamin D deficiency, unspecified: Secondary | ICD-10-CM | POA: Diagnosis not present

## 2018-03-21 DIAGNOSIS — Z79899 Other long term (current) drug therapy: Secondary | ICD-10-CM | POA: Diagnosis not present

## 2018-03-27 ENCOUNTER — Encounter (INDEPENDENT_AMBULATORY_CARE_PROVIDER_SITE_OTHER): Payer: Medicare Other

## 2018-03-27 ENCOUNTER — Ambulatory Visit (INDEPENDENT_AMBULATORY_CARE_PROVIDER_SITE_OTHER): Payer: Medicare Other | Admitting: Vascular Surgery

## 2018-03-27 ENCOUNTER — Other Ambulatory Visit (INDEPENDENT_AMBULATORY_CARE_PROVIDER_SITE_OTHER): Payer: Medicare Other

## 2018-03-30 ENCOUNTER — Other Ambulatory Visit (INDEPENDENT_AMBULATORY_CARE_PROVIDER_SITE_OTHER): Payer: Medicare Other

## 2018-04-02 ENCOUNTER — Ambulatory Visit
Admission: RE | Admit: 2018-04-02 | Discharge: 2018-04-02 | Disposition: A | Discharge status: Home / Self Care | Payer: Medicare Other | Source: Ambulatory Visit | Attending: Neurology | Admitting: Neurology

## 2018-04-02 DIAGNOSIS — G20 Parkinson's disease: Secondary | ICD-10-CM | POA: Insufficient documentation

## 2018-04-02 DIAGNOSIS — E538 Deficiency of other specified B group vitamins: Secondary | ICD-10-CM | POA: Insufficient documentation

## 2018-04-05 ENCOUNTER — Ambulatory Visit: Admit: 2018-04-05 | Payer: Medicare Other | Admitting: Ophthalmology

## 2018-04-05 SURGERY — PHACOEMULSIFICATION, CATARACT, WITH IOL INSERTION
Anesthesia: Topical | Laterality: Left

## 2018-04-13 ENCOUNTER — Other Ambulatory Visit: Payer: Medicare Other

## 2018-04-17 ENCOUNTER — Ambulatory Visit: Payer: Medicare Other | Admitting: Radiation Oncology

## 2018-04-19 ENCOUNTER — Other Ambulatory Visit: Payer: Self-pay

## 2018-04-19 ENCOUNTER — Inpatient Hospital Stay: Payer: Medicare Other | Attending: Radiation Oncology

## 2018-04-19 DIAGNOSIS — C61 Malignant neoplasm of prostate: Secondary | ICD-10-CM | POA: Diagnosis not present

## 2018-04-19 DIAGNOSIS — Z923 Personal history of irradiation: Secondary | ICD-10-CM | POA: Diagnosis not present

## 2018-04-19 LAB — PSA: Prostatic Specific Antigen: 0.53 ng/mL (ref 0.00–4.00)

## 2018-04-20 ENCOUNTER — Ambulatory Visit: Payer: Medicare Other | Admitting: Radiation Oncology

## 2018-04-24 ENCOUNTER — Encounter: Payer: Self-pay | Admitting: Radiation Oncology

## 2018-04-24 ENCOUNTER — Other Ambulatory Visit: Payer: Self-pay

## 2018-04-24 ENCOUNTER — Ambulatory Visit
Admission: RE | Admit: 2018-04-24 | Discharge: 2018-04-24 | Disposition: A | Discharge status: Home / Self Care | Payer: Medicare Other | Source: Ambulatory Visit | Attending: Radiation Oncology | Admitting: Radiation Oncology

## 2018-04-24 DIAGNOSIS — Z923 Personal history of irradiation: Secondary | ICD-10-CM | POA: Diagnosis not present

## 2018-04-24 DIAGNOSIS — C61 Malignant neoplasm of prostate: Secondary | ICD-10-CM | POA: Insufficient documentation

## 2018-04-24 NOTE — Progress Notes (Signed)
Radiation Oncology Follow up Note  Name: Howard Anderson   Date:   04/24/2018 MRN:  [id number] DOB: Feb 23, 1946    This 72 y.o. male presents to the clinic today for to year follow-up status post I MRT radiation therapy for Gleason 7 adenocarcinoma.  REFERRING PROVIDER: Juline Patch, MD  HPI: patient is a 72 year old male now seen out 2 years having completed IM RT radiation therapy for Gleason 7 (3+4) adenocarcinoma presenting the PSA of 10 seen today in routine follow-up he is doing well. Specifically denies diarrhea dysuria or any other GI/GU complaints..his most recent PSA done 5 days ago was 0.53 down from 1 year ago when it was 8.18  COMPLICATIONS OF TREATMENT: none  FOLLOW UP COMPLIANCE: keeps appointments   PHYSICAL EXAM:  BP (P) 134/84 (BP Location: Right Arm, Patient Position: Sitting)   Pulse (P) 84   Temp (!) (P) 95.1 F (35.1 C) (Tympanic)   Wt (P) 205 lb 4 oz (93.1 kg)   BMI (P) 32.15 kg/m  Well-developed well-nourished patient in NAD. HEENT reveals PERLA, EOMI, discs not visualized.  Oral cavity is clear. No oral mucosal lesions are identified. Neck is clear without evidence of cervical or supraclavicular adenopathy. Lungs are clear to A&P. Cardiac examination is essentially unremarkable with regular rate and rhythm without murmur rub or thrill. Abdomen is benign with no organomegaly or masses noted. Motor sensory and DTR levels are equal and symmetric in the upper and lower extremities. Cranial nerves II through XII are grossly intact. Proprioception is intact. No peripheral adenopathy or edema is identified. No motor or sensory levels are noted. Crude visual fields are within normal range.  RADIOLOGY RESULTS: no current films for review  PLAN: at the present time he is under excellent biochemical control of his prostate cancer. I'm please was overall progress. I've asked to see him back in 1 year for follow-up with a PSA prior to that visit. Patient knows to call with  any concerns at any time.  I would like to take this opportunity to thank you for allowing me to participate in the care of your patient.Noreene Filbert, MD

## 2018-04-27 ENCOUNTER — Ambulatory Visit: Payer: Medicare Other | Admitting: Radiation Oncology

## 2018-05-04 ENCOUNTER — Ambulatory Visit (INDEPENDENT_AMBULATORY_CARE_PROVIDER_SITE_OTHER): Payer: Medicare Other | Admitting: Vascular Surgery

## 2018-05-04 ENCOUNTER — Other Ambulatory Visit (INDEPENDENT_AMBULATORY_CARE_PROVIDER_SITE_OTHER): Payer: Medicare Other

## 2018-05-04 ENCOUNTER — Encounter (INDEPENDENT_AMBULATORY_CARE_PROVIDER_SITE_OTHER): Payer: Medicare Other

## 2018-05-18 DIAGNOSIS — I10 Essential (primary) hypertension: Secondary | ICD-10-CM | POA: Diagnosis not present

## 2018-05-18 DIAGNOSIS — I714 Abdominal aortic aneurysm, without rupture: Secondary | ICD-10-CM | POA: Diagnosis not present

## 2018-05-18 DIAGNOSIS — E782 Mixed hyperlipidemia: Secondary | ICD-10-CM | POA: Diagnosis not present

## 2018-05-18 DIAGNOSIS — I38 Endocarditis, valve unspecified: Secondary | ICD-10-CM | POA: Diagnosis not present

## 2018-05-18 DIAGNOSIS — R600 Localized edema: Secondary | ICD-10-CM | POA: Diagnosis not present

## 2018-06-05 ENCOUNTER — Ambulatory Visit (INDEPENDENT_AMBULATORY_CARE_PROVIDER_SITE_OTHER): Payer: Medicare Other | Admitting: Vascular Surgery

## 2018-06-05 ENCOUNTER — Other Ambulatory Visit (INDEPENDENT_AMBULATORY_CARE_PROVIDER_SITE_OTHER): Payer: Self-pay | Admitting: Vascular Surgery

## 2018-06-05 ENCOUNTER — Encounter (INDEPENDENT_AMBULATORY_CARE_PROVIDER_SITE_OTHER): Payer: Medicare Other

## 2018-06-05 ENCOUNTER — Other Ambulatory Visit (INDEPENDENT_AMBULATORY_CARE_PROVIDER_SITE_OTHER): Payer: Medicare Other

## 2018-06-05 DIAGNOSIS — I739 Peripheral vascular disease, unspecified: Secondary | ICD-10-CM

## 2018-06-05 DIAGNOSIS — Z95828 Presence of other vascular implants and grafts: Secondary | ICD-10-CM

## 2018-06-08 ENCOUNTER — Ambulatory Visit (INDEPENDENT_AMBULATORY_CARE_PROVIDER_SITE_OTHER): Payer: Medicare Other

## 2018-06-08 ENCOUNTER — Other Ambulatory Visit (INDEPENDENT_AMBULATORY_CARE_PROVIDER_SITE_OTHER): Payer: Self-pay | Admitting: Vascular Surgery

## 2018-06-08 ENCOUNTER — Ambulatory Visit (INDEPENDENT_AMBULATORY_CARE_PROVIDER_SITE_OTHER): Payer: Medicare Other | Admitting: Vascular Surgery

## 2018-06-08 ENCOUNTER — Encounter (INDEPENDENT_AMBULATORY_CARE_PROVIDER_SITE_OTHER): Payer: Self-pay | Admitting: Vascular Surgery

## 2018-06-08 VITALS — BP 131/85 | HR 80 | Resp 16 | Wt 204.0 lb

## 2018-06-08 DIAGNOSIS — I714 Abdominal aortic aneurysm, without rupture, unspecified: Secondary | ICD-10-CM

## 2018-06-08 DIAGNOSIS — Z8679 Personal history of other diseases of the circulatory system: Secondary | ICD-10-CM | POA: Diagnosis not present

## 2018-06-08 DIAGNOSIS — I872 Venous insufficiency (chronic) (peripheral): Secondary | ICD-10-CM | POA: Diagnosis not present

## 2018-06-08 DIAGNOSIS — I10 Essential (primary) hypertension: Secondary | ICD-10-CM | POA: Diagnosis not present

## 2018-06-08 DIAGNOSIS — Z95828 Presence of other vascular implants and grafts: Secondary | ICD-10-CM

## 2018-06-08 DIAGNOSIS — F17210 Nicotine dependence, cigarettes, uncomplicated: Secondary | ICD-10-CM

## 2018-06-08 DIAGNOSIS — J439 Emphysema, unspecified: Secondary | ICD-10-CM

## 2018-06-08 NOTE — Progress Notes (Signed)
MRN : [id number]  Howard Anderson is a 72 y.o. (April 15, 1946) male who presents with chief complaint of No chief complaint on file. [name]  History of Present Illness:   The patient returns to the office for surveillance of an abdominal aortic aneurysm status post stent graft placement on 05/21/2015.   Patient denies abdominal pain or back pain, no other abdominal complaints. No groin related complaints. No symptoms consistent with distal embolization No changes in claudication distance.   There have been no interval changes in his overall healthcare since his last visit.   Patient denies amaurosis fugax or TIA symptoms. There is no history of claudication or rest pain symptoms of the lower extremities. The patient denies angina or shortness of breath.   Duplex ultrasound of the abdominal aorta shows no endoleak with a sac of 5.6 cm  Current Meds  Medication Sig  . aspirin 81 MG tablet Take 1 tablet (81 mg total) by mouth daily.  [name] atorvastatin (LIPITOR) 10 MG tablet TAKE 1 TABLET BY MOUTH EVERY DAY-NEEDS APPT  . Cholecalciferol (VITAMIN D3) 1000 UNITS CAPS Take 1 capsule by mouth 2 (two) times daily.  . dorzolamide (TRUSOPT) 2 % ophthalmic solution INSTILL 1 DROP BOTH EYES TWO TIMES A DAY  . doxycycline (PERIOSTAT) 20 MG tablet Take 1 tablet (20 mg total) by mouth 2 (two) times daily.  . Fluticasone-Salmeterol (WIXELA INHUB) 250-50 MCG/DOSE AEPB TAKE 1 PUFF BY MOUTH TWICE A DAY  . latanoprost (XALATAN) 0.005 % ophthalmic solution 1 drop at bedtime.   . mirabegron ER (MYRBETRIQ) 25 MG TB24 tablet Take 1 tablet (25 mg total) by mouth daily.  . Misc Natural Products (CYSTEX) LIQD Take 1 Dose by mouth.  . Multiple Vitamins-Minerals (PRESERVISION AREDS PO) Take 1 capsule by mouth 2 (two) times daily.  [name] Propylene Glycol (SYSTANE BALANCE) 0.6 % SOLN Apply 1 drop to eye 2 (two) times daily.  . ranitidine (ZANTAC) 150 MG tablet Take 0.5 tablets (75 mg total) by mouth daily.  . tamsulosin  (FLOMAX) 0.4 MG CAPS capsule Take 1 capsule (0.4 mg total) by mouth daily.  . valsartan (DIOVAN) 80 MG tablet TAKE 1 TABLET (80 MG TOTAL) BY MOUTH ONCE DAILY.    Past Medical History:  Diagnosis Date  . AAA (abdominal aortic aneurysm) without rupture ([place])   . AAA (abdominal aortic aneurysm) without rupture ([place])   . Cancer [facility])    prostate cancer  . Chest pain   . COPD (chronic obstructive pulmonary disease) ([place])   . Dizziness and giddiness   . GERD (gastroesophageal reflux disease)   . Heart disease   . Hyperlipidemia   . Hypertension   . Parkinson disease ([place])   . Prostate cancer ([place])   . Shortness of breath dyspnea   . Shoulder fracture, right   . Stroke [facility])    tia    Past Surgical History:  Procedure Laterality Date  . COLONOSCOPY  2010  . EMBOLECTOMY Left 06/20/2015   Procedure: EMBOLECTOMY;  Surgeon: [name], MD;  Location: [facility] ORS;  Service: Vascular;  Laterality: Left;  brachial thrombectomy  . ENDARTERECTOMY FEMORAL Left 06/20/2015   Procedure: ENDARTERECTOMY FEMORAL/FEMORAL PSEUDOANEURYSM repair;  Surgeon: [name], MD;  Location: [facility] ORS;  Service: Vascular;  Laterality: Left;  femerol  . INNER EAR SURGERY    . PERIPHERAL VASCULAR CATHETERIZATION N/A 05/21/2015   Procedure: Endovascular Repair/Stent Graft;  Surgeon: [name], MD;  Location: [place] CV LAB;  Service: Cardiovascular;  Laterality: N/A;  . PERIPHERAL VASCULAR CATHETERIZATION  06/20/2015   Procedure: Lower Extremity Angiography;  Surgeon: [name], MD;  Location: [place] CV LAB;  Service: Cardiovascular;;  . PERIPHERAL VASCULAR CATHETERIZATION  06/20/2015   Procedure: Lower Extremity Intervention;  Surgeon: [name], MD;  Location: [place] CV LAB;  Service: Cardiovascular;;    Social History Social History   Tobacco Use  . Smoking status: Former Smoker    Packs/day: 1.50    Years: 48.00    Pack years: 72.00    Last attempt  to quit: 10/10/2013    Years since quitting: 4.6  . Smokeless tobacco: Never Used  Substance Use Topics  . Alcohol use: No  . Drug use: No    Family History Family History  Problem Relation Age of Onset  . Prostate cancer Brother   . Bladder Cancer Neg Hx   . Kidney cancer Neg Hx     Allergies  Allergen Reactions  . No Known Allergies      REVIEW OF SYSTEMS (Negative unless checked)  Constitutional: [] Weight loss  [] Fever  [] Chills Cardiac: [] Chest pain   [] Chest pressure   [] Palpitations   [] Shortness of breath when laying flat   [] Shortness of breath with exertion. Vascular:  [x] Pain in legs with walking   [] Pain in legs at rest  [] History of DVT   [] Phlebitis   [x] Swelling in legs   [] Varicose veins   [] Non-healing ulcers Pulmonary:   [] Uses home oxygen   [] Productive cough   [] Hemoptysis   [] Wheeze  [] COPD   [] Asthma Neurologic:  [] Dizziness   [] Seizures   [] History of stroke   [] History of TIA  [] Aphasia   [] Vissual changes   [] Weakness or numbness in arm   [] Weakness or numbness in leg Musculoskeletal:   [] Joint swelling   [x] Joint pain   [] Low back pain Hematologic:  [] Easy bruising  [] Easy bleeding   [] Hypercoagulable state   [] Anemic Gastrointestinal:  [] Diarrhea   [] Vomiting  [] Gastroesophageal reflux/heartburn   [] Difficulty swallowing. Genitourinary:  [] Chronic kidney disease   [] Difficult urination  [] Frequent urination   [] Blood in urine Skin:  [] Rashes   [] Ulcers  Psychological:  [] History of anxiety   []  History of major depression.  Physical Examination  Vitals:   06/08/18 1106  BP: 131/85  Pulse: 80  Resp: 16  Weight: 204 lb (92.5 kg)   Body mass index is 31.95 kg/m. Gen: WD/WN, NAD Head: [place]/AT, No temporalis wasting.  Ear/Nose/Throat: Hearing grossly intact, nares w/o erythema or drainage Eyes: PER, EOMI, sclera nonicteric.  Neck: Supple, no large masses.   Pulmonary:  Good air movement, no audible wheezing bilaterally, no use of accessory  muscles.  Cardiac: RRR, no JVD Vascular:  Vessel Right Left  Radial Palpable Palpable  PT Palpable Palpable  DP Palpable Palpable  Gastrointestinal: Non-distended. No guarding/no peritoneal signs.  Musculoskeletal: M/S 5/5 throughout.  No deformity or atrophy.  Neurologic: CN 2-12 intact. Symmetrical.  Speech is fluent. Motor exam as listed above. Psychiatric: Judgment intact, Mood & affect appropriate for pt's clinical situation. Dermatologic: No rashes or ulcers noted.  No changes consistent with cellulitis. Lymph : No lichenification or skin changes of chronic lymphedema.  CBC Lab Results  Component Value Date   WBC 5.8 03/09/2016   HGB 14.6 03/09/2016   HCT 43.3 03/09/2016   MCV 88.2 03/09/2016   PLT 263 03/09/2016    BMET    Component Value Date/Time   NA 140 09/27/2016 0000  NA 139 08/02/2014 1128   K 4.8 09/27/2016 0000   K 4.2 08/02/2014 1128   CL 102 09/27/2016 0000   CL 106 08/02/2014 1128   CO2 23 09/27/2016 0000   CO2 25 08/02/2014 1128   GLUCOSE 123 (H) 09/27/2016 0000   GLUCOSE 129 (H) 01/01/2016 0526   GLUCOSE 175 (H) 08/02/2014 1128   BUN 18 09/27/2016 0000   BUN 16 08/02/2014 1128   CREATININE 1.00 09/27/2016 0000   CREATININE 0.74 08/03/2014 0830   CALCIUM 9.3 09/27/2016 0000   CALCIUM 7.9 (L) 08/02/2014 1128   GFRNONAA 75 09/27/2016 0000   GFRNONAA >60 08/03/2014 0830   GFRNONAA >60 10/18/2013 0502   GFRAA 87 09/27/2016 0000   GFRAA >60 08/03/2014 0830   GFRAA >60 10/18/2013 0502   CrCl cannot be calculated (Patient's most recent lab result is older than the maximum 21 days allowed.).  COAG Lab Results  Component Value Date   INR 0.98 05/08/2015    Radiology Vas [place] Groin Pseudoaneurysm  Result Date: 06/08/2018  ARTERIAL PSEUDOANEURYSM  Exam: Left groin Performing Technologist: Charlane Ferretti RT (R)(VS)  Examination Guidelines: A complete evaluation includes B-mode imaging, spectral Doppler, color Doppler, and power Doppler as needed  of all accessible portions of each vessel. Bilateral testing is considered an integral part of a complete examination. Limited examinations for reoccurring indications may be performed as noted. +------------+----------+--------+------+----------+ Right DuplexPSV (cm/s)WaveformPlaqueComment(s) +------------+----------+--------+------+----------+ CFA            104                             +------------+----------+--------+------+----------+ Prox SFA       -74                             +------------+----------+--------+------+----------+  Summary: No evidence of pseudoaneurysm, AVF or DVT Diagnosing physician: Hortencia Pilar MD Electronically signed by Hortencia Pilar MD on 06/08/2018 at 12:00:19 PM.   --------------------------------------------------------------------------------    Final    Vas [place] Evar Duplex  Result Date: 06/08/2018 Endovascular Aortic Repair Study (EVAR) Indications: Follow up exam for EVAR. Surgery date 05/21/2015. Limitations: Air/bowel gas and obesity.  Comparison Study: 03/28/2017 Performing Technologist: Charlane Ferretti RT (R)(VS)  Examination Guidelines: A complete evaluation includes B-mode imaging, spectral Doppler, color Doppler, and power Doppler as needed of all accessible portions of each vessel. Bilateral testing is considered an integral part of a complete examination. Limited examinations for reoccurring indications may be performed as noted.  Abdominal Aorta Findings: +-----------+-------+----------+----------+--------+--------+--------+ Location   AP (cm)Trans (cm)PSV (cm/s)WaveformThrombusComments +-----------+-------+----------+----------+--------+--------+--------+ Proximal                    43                                 +-----------+-------+----------+----------+--------+--------+--------+ Mid        2.43   2.78      42                                  +-----------+-------+----------+----------+--------+--------+--------+ Distal     5.64   3.06      115                                +-----------+-------+----------+----------+--------+--------+--------+  RT CIA Prox1.0    1.2       56                                 +-----------+-------+----------+----------+--------+--------+--------+ LT CIA Prox1.2    1.6       66                                 +-----------+-------+----------+----------+--------+--------+--------+  Visualization of the Proximal Abdominal Aorta, Mid Abdominal Aorta and Distal Abdominal Aorta was limited. Very limited exam due to ptient body habitus and bowel gas. Endovascular Aortic Repair (EVAR): +----------+----------------+-------------------+-------------------+           Diameter AP (cm)Diameter Trans (cm)Velocities (cm/sec) +----------+----------------+-------------------+-------------------+ Aorta     5.64            3.06               47                  +----------+----------------+-------------------+-------------------+ Right Limb0.98            1.20               56                  +----------+----------------+-------------------+-------------------+ Left Limb 1.18            1.59               66                  +----------+----------------+-------------------+-------------------+  Summary: Abdominal Aorta: The largest aortic measurement is 5.6 cm. Patent endovascular aneurysm repair with no evidence of endoleak. Previous diameter measurement was 4.0 cm obtained on 03/28/2017.  *See table(s) above for measurements and observations.  Electronically signed by Hortencia Pilar MD on 06/08/2018 at 12:00:25 PM.   Final      Assessment/Plan 1. Abdominal aortic aneurysm (AAA) without rupture (HCC) Recommend: Patient is status post successful endovascular repair of the AAA.   No further intervention is required at this time.   No endoleak is detected and the aneurysm sac is stable.  The  patient will continue antiplatelet therapy as prescribed as well as aggressive management of hyperlipidemia. Exercise is again strongly encouraged.   However, endografts require continued surveillance with ultrasound or CT scan. This is mandatory to detect any changes that allow repressurization of the aneurysm sac.  The patient is informed that this would be asymptomatic.  The patient is reminded that lifelong routine surveillance is a necessity with an endograft. Patient will continue to follow-up at 6 month intervals with ultrasound of the aorta.  - VAS US AORTA/IVC/ILIACS; Future  2. Essential hypertension Continue antihypertensive medications as already ordered, these medications have been reviewed and there are no changes at this time.   3. Venous insufficiency of both lower extremities No surgery or intervention at this point in time.    I have had a long discussion with the patient regarding venous insufficiency and why it  causes symptoms. I have discussed with the patient the chronic skin changes that accompany venous insufficiency and the long term sequela such as infection and ulceration.  Patient will begin wearing graduated compression stockings class 1 (20-30 mmHg) or compression wraps on a daily basis a prescription was given. The patient will put the stockings on first thing in  the morning and removing them in the evening. The patient is instructed specifically not to sleep in the stockings.    In addition, behavioral modification including several periods of elevation of the lower extremities during the day will be continued. I have demonstrated that proper elevation is a position with the ankles at heart level.  The patient is instructed to begin routine exercise, especially walking on a daily basis  Patient should undergo duplex ultrasound of the venous system to ensure that DVT or reflux is not present.  Following the review of the ultrasound the patient will follow up in  2-3 months to reassess the degree of swelling and the control that graduated compression stockings or compression wraps  is offering.   The patient can be assessed for a Lymph Pump at that time  4. Pulmonary emphysema, unspecified emphysema type ([place]) Continue pulmonary medications and aerosols as already ordered, these medications have been reviewed and there are no changes at this time.    Hortencia Pilar, MD  06/08/2018 8:51 PM

## 2018-06-15 DIAGNOSIS — G20 Parkinson's disease: Secondary | ICD-10-CM | POA: Diagnosis not present

## 2018-06-15 DIAGNOSIS — E538 Deficiency of other specified B group vitamins: Secondary | ICD-10-CM | POA: Diagnosis not present

## 2018-06-15 DIAGNOSIS — Z7189 Other specified counseling: Secondary | ICD-10-CM | POA: Diagnosis not present

## 2018-07-03 DIAGNOSIS — G20 Parkinson's disease: Secondary | ICD-10-CM | POA: Diagnosis not present

## 2018-07-17 DIAGNOSIS — H3554 Dystrophies primarily involving the retinal pigment epithelium: Secondary | ICD-10-CM | POA: Diagnosis not present

## 2018-07-18 DIAGNOSIS — H401131 Primary open-angle glaucoma, bilateral, mild stage: Secondary | ICD-10-CM | POA: Diagnosis not present

## 2018-07-19 DIAGNOSIS — H401131 Primary open-angle glaucoma, bilateral, mild stage: Secondary | ICD-10-CM | POA: Diagnosis not present

## 2018-08-05 ENCOUNTER — Other Ambulatory Visit: Payer: Self-pay | Admitting: Family Medicine

## 2018-08-05 DIAGNOSIS — E782 Mixed hyperlipidemia: Secondary | ICD-10-CM

## 2018-08-05 DIAGNOSIS — C61 Malignant neoplasm of prostate: Secondary | ICD-10-CM

## 2018-08-28 DIAGNOSIS — H2512 Age-related nuclear cataract, left eye: Secondary | ICD-10-CM | POA: Diagnosis not present

## 2018-09-01 ENCOUNTER — Ambulatory Visit: Payer: Medicare Other | Admitting: Physical Therapy

## 2018-09-04 ENCOUNTER — Other Ambulatory Visit: Payer: Self-pay | Admitting: Family Medicine

## 2018-09-04 DIAGNOSIS — C61 Malignant neoplasm of prostate: Secondary | ICD-10-CM

## 2018-09-20 ENCOUNTER — Encounter: Payer: Self-pay | Admitting: *Deleted

## 2018-09-26 ENCOUNTER — Ambulatory Visit: Admit: 2018-09-26 | Payer: Medicare Other | Admitting: Ophthalmology

## 2018-09-26 SURGERY — PHACOEMULSIFICATION, CATARACT, WITH IOL INSERTION
Anesthesia: Topical | Laterality: Left

## 2018-09-27 ENCOUNTER — Encounter
Admission: RE | Disposition: A | Discharge status: Home / Self Care | Payer: Self-pay | Source: Home / Self Care | Attending: Ophthalmology

## 2018-09-27 ENCOUNTER — Ambulatory Visit: Payer: Medicare Other | Admitting: Anesthesiology

## 2018-09-27 ENCOUNTER — Ambulatory Visit
Admission: RE | Admit: 2018-09-27 | Discharge: 2018-09-27 | Disposition: A | Discharge status: Home / Self Care | Payer: Medicare Other | Attending: Ophthalmology | Admitting: Ophthalmology

## 2018-09-27 ENCOUNTER — Other Ambulatory Visit: Payer: Self-pay

## 2018-09-27 DIAGNOSIS — E785 Hyperlipidemia, unspecified: Secondary | ICD-10-CM | POA: Insufficient documentation

## 2018-09-27 DIAGNOSIS — Z8546 Personal history of malignant neoplasm of prostate: Secondary | ICD-10-CM | POA: Diagnosis not present

## 2018-09-27 DIAGNOSIS — I2510 Atherosclerotic heart disease of native coronary artery without angina pectoris: Secondary | ICD-10-CM | POA: Diagnosis not present

## 2018-09-27 DIAGNOSIS — E669 Obesity, unspecified: Secondary | ICD-10-CM | POA: Insufficient documentation

## 2018-09-27 DIAGNOSIS — I739 Peripheral vascular disease, unspecified: Secondary | ICD-10-CM | POA: Insufficient documentation

## 2018-09-27 DIAGNOSIS — M1990 Unspecified osteoarthritis, unspecified site: Secondary | ICD-10-CM | POA: Insufficient documentation

## 2018-09-27 DIAGNOSIS — I714 Abdominal aortic aneurysm, without rupture: Secondary | ICD-10-CM | POA: Insufficient documentation

## 2018-09-27 DIAGNOSIS — H2512 Age-related nuclear cataract, left eye: Secondary | ICD-10-CM | POA: Insufficient documentation

## 2018-09-27 DIAGNOSIS — H5703 Miosis: Secondary | ICD-10-CM | POA: Insufficient documentation

## 2018-09-27 DIAGNOSIS — Z923 Personal history of irradiation: Secondary | ICD-10-CM | POA: Diagnosis not present

## 2018-09-27 DIAGNOSIS — F329 Major depressive disorder, single episode, unspecified: Secondary | ICD-10-CM | POA: Diagnosis not present

## 2018-09-27 DIAGNOSIS — Z6832 Body mass index (BMI) 32.0-32.9, adult: Secondary | ICD-10-CM | POA: Insufficient documentation

## 2018-09-27 DIAGNOSIS — K219 Gastro-esophageal reflux disease without esophagitis: Secondary | ICD-10-CM | POA: Insufficient documentation

## 2018-09-27 DIAGNOSIS — I10 Essential (primary) hypertension: Secondary | ICD-10-CM | POA: Diagnosis not present

## 2018-09-27 DIAGNOSIS — G20 Parkinson's disease: Secondary | ICD-10-CM | POA: Insufficient documentation

## 2018-09-27 DIAGNOSIS — Z86718 Personal history of other venous thrombosis and embolism: Secondary | ICD-10-CM | POA: Insufficient documentation

## 2018-09-27 DIAGNOSIS — Z87891 Personal history of nicotine dependence: Secondary | ICD-10-CM | POA: Insufficient documentation

## 2018-09-27 DIAGNOSIS — J449 Chronic obstructive pulmonary disease, unspecified: Secondary | ICD-10-CM | POA: Insufficient documentation

## 2018-09-27 HISTORY — PX: CATARACT EXTRACTION W/PHACO: SHX586

## 2018-09-27 HISTORY — DX: Major depressive disorder, single episode, unspecified: F32.9

## 2018-09-27 HISTORY — DX: Depression, unspecified: F32.A

## 2018-09-27 HISTORY — DX: Acute embolism and thrombosis of unspecified femoral vein: I82.419

## 2018-09-27 SURGERY — PHACOEMULSIFICATION, CATARACT, WITH IOL INSERTION
Anesthesia: Monitor Anesthesia Care | Site: Eye | Laterality: Left

## 2018-09-27 MED ORDER — CARBACHOL 0.01 % IO SOLN
INTRAOCULAR | Status: DC | PRN
  Administered 2018-09-27: .5 mL via INTRAOCULAR

## 2018-09-27 MED ORDER — MIDAZOLAM HCL 2 MG/2ML IJ SOLN
INTRAMUSCULAR | Status: AC
  Filled 2018-09-27: qty 2

## 2018-09-27 MED ORDER — FENTANYL CITRATE (PF) 100 MCG/2ML IJ SOLN
INTRAMUSCULAR | Status: AC
  Filled 2018-09-27: qty 2

## 2018-09-27 MED ORDER — ARMC OPHTHALMIC DILATING DROPS
OPHTHALMIC | Status: AC
  Administered 2018-09-27: 1 via OPHTHALMIC
  Filled 2018-09-27: qty 0.5

## 2018-09-27 MED ORDER — EPINEPHRINE PF 1 MG/ML IJ SOLN
INTRAMUSCULAR | Status: AC
  Filled 2018-09-27: qty 1

## 2018-09-27 MED ORDER — EPINEPHRINE PF 1 MG/ML IJ SOLN
INTRAOCULAR | Status: DC | PRN
  Administered 2018-09-27: 1 mL via OPHTHALMIC

## 2018-09-27 MED ORDER — MOXIFLOXACIN HCL 0.5 % OP SOLN
OPHTHALMIC | Status: AC
  Filled 2018-09-27: qty 6

## 2018-09-27 MED ORDER — POVIDONE-IODINE 5 % OP SOLN
OPHTHALMIC | Status: DC | PRN
  Administered 2018-09-27: 1 via OPHTHALMIC

## 2018-09-27 MED ORDER — SODIUM CHLORIDE 0.9 % IV SOLN
INTRAVENOUS | Status: DC
  Administered 2018-09-27: 09:00:00 via INTRAVENOUS

## 2018-09-27 MED ORDER — MOXIFLOXACIN HCL 0.5 % OP SOLN
1.0000 [drp] | OPHTHALMIC | Status: DC | PRN
  Administered 2018-09-27 (×2): 1 [drp] via OPHTHALMIC

## 2018-09-27 MED ORDER — MOXIFLOXACIN HCL 0.5 % OP SOLN
1.0000 [drp] | OPHTHALMIC | Status: AC
  Administered 2018-09-27: 1 [drp] via OPHTHALMIC

## 2018-09-27 MED ORDER — FENTANYL CITRATE (PF) 100 MCG/2ML IJ SOLN
INTRAMUSCULAR | Status: DC | PRN
  Administered 2018-09-27 (×2): 50 ug via INTRAVENOUS

## 2018-09-27 MED ORDER — NEOMYCIN-POLYMYXIN-DEXAMETH 3.5-10000-0.1 OP OINT
TOPICAL_OINTMENT | OPHTHALMIC | Status: AC
  Filled 2018-09-27: qty 3.5

## 2018-09-27 MED ORDER — MIDAZOLAM HCL 2 MG/2ML IJ SOLN
INTRAMUSCULAR | Status: DC | PRN
  Administered 2018-09-27 (×2): 1 mg via INTRAVENOUS

## 2018-09-27 MED ORDER — TETRACAINE HCL 0.5 % OP SOLN
OPHTHALMIC | Status: AC
  Administered 2018-09-27: 1 [drp] via OPHTHALMIC
  Filled 2018-09-27: qty 4

## 2018-09-27 MED ORDER — NA HYALUR & NA CHOND-NA HYALUR 0.4-0.35 ML IO KIT
PACK | INTRAOCULAR | Status: DC | PRN
  Administered 2018-09-27: 1 mL via INTRAOCULAR

## 2018-09-27 MED ORDER — ARMC OPHTHALMIC DILATING DROPS
1.0000 "application " | OPHTHALMIC | Status: AC | PRN
  Administered 2018-09-27 (×3): 1 via OPHTHALMIC

## 2018-09-27 MED ORDER — NA HYALUR & NA CHOND-NA HYALUR 0.55-0.5 ML IO KIT
PACK | INTRAOCULAR | Status: AC
  Filled 2018-09-27: qty 1.05

## 2018-09-27 MED ORDER — LIDOCAINE HCL (PF) 4 % IJ SOLN
INTRAMUSCULAR | Status: AC
  Filled 2018-09-27: qty 5

## 2018-09-27 MED ORDER — POVIDONE-IODINE 5 % OP SOLN
OPHTHALMIC | Status: AC
  Filled 2018-09-27: qty 30

## 2018-09-27 MED ORDER — TETRACAINE HCL 0.5 % OP SOLN
1.0000 [drp] | Freq: Once | OPHTHALMIC | Status: AC
  Administered 2018-09-27 (×2): 1 [drp] via OPHTHALMIC

## 2018-09-27 MED ORDER — NEOMYCIN-POLYMYXIN-DEXAMETH 0.1 % OP OINT
TOPICAL_OINTMENT | OPHTHALMIC | Status: DC | PRN
  Administered 2018-09-27: 1 via OPHTHALMIC

## 2018-09-27 MED ORDER — LIDOCAINE HCL (PF) 4 % IJ SOLN
INTRAOCULAR | Status: DC | PRN
  Administered 2018-09-27: 2 mL via OPHTHALMIC

## 2018-09-27 SURGICAL SUPPLY — 20 items
GLOVE BIO SURGEON STRL SZ8 (GLOVE) ×3 IMPLANT
GLOVE BIOGEL M 6.5 STRL (GLOVE) ×3 IMPLANT
GLOVE SURG LX 7.5 STRW (GLOVE) ×2
GLOVE SURG LX STRL 7.5 STRW (GLOVE) ×1 IMPLANT
GOWN STRL REUS W/ TWL LRG LVL3 (GOWN DISPOSABLE) ×2 IMPLANT
GOWN STRL REUS W/TWL LRG LVL3 (GOWN DISPOSABLE) ×4
LABEL CATARACT MEDS ST (LABEL) ×3 IMPLANT
LENS IOL ACRSF IQ ULTRA 24.5 (Intraocular Lens) IMPLANT
LENS IOL ACRYSOF IQ 24.5 (Intraocular Lens) ×3 IMPLANT
NDL HPO THNWL 1X22GA REG BVL (NEEDLE) ×1 IMPLANT
NEEDLE SAFETY 22GX1 (NEEDLE) ×2
PACK CATARACT (MISCELLANEOUS) ×3 IMPLANT
PACK CATARACT BRASINGTON LX (MISCELLANEOUS) ×3 IMPLANT
PACK EYE AFTER SURG (MISCELLANEOUS) ×3 IMPLANT
RING MALYGIN 7.0 (MISCELLANEOUS) ×2 IMPLANT
SOL BSS BAG (MISCELLANEOUS) ×3
SOLUTION BSS BAG (MISCELLANEOUS) ×1 IMPLANT
SYR 5ML LL (SYRINGE) ×3 IMPLANT
WATER STERILE IRR 250ML POUR (IV SOLUTION) ×3 IMPLANT
WIPE NON LINTING 3.25X3.25 (MISCELLANEOUS) ×3 IMPLANT

## 2018-09-27 NOTE — Anesthesia Post-op Follow-up Note (Signed)
Anesthesia QCDR form completed.        

## 2018-09-27 NOTE — Transfer of Care (Signed)
Immediate Anesthesia Transfer of Care Note  Patient: Howard Anderson  Procedure(s) Performed: CATARACT EXTRACTION PHACO AND INTRAOCULAR LENS PLACEMENT (IOC) LEFT (Left Eye)  Patient Location: PACU and Short Stay  Anesthesia Type:MAC  Level of Consciousness: awake, alert  and oriented  Airway & Oxygen Therapy: Patient Spontanous Breathing  Post-op Assessment: Report given to RN and Post -op Vital signs reviewed and stable  Post vital signs: Reviewed and stable  Last Vitals:  Vitals Value Taken Time  BP    Temp    Pulse    Resp    SpO2      Last Pain:  Vitals:   09/27/18 0758  TempSrc: Tympanic  PainSc: 0-No pain         Complications: No apparent anesthesia complications

## 2018-09-27 NOTE — Anesthesia Postprocedure Evaluation (Signed)
Anesthesia Post Note  Patient: Howard Anderson  Procedure(s) Performed: CATARACT EXTRACTION PHACO AND INTRAOCULAR LENS PLACEMENT (IOC) LEFT (Left Eye)  Patient location during evaluation: PACU Anesthesia Type: MAC Level of consciousness: awake and alert and oriented Pain management: pain level controlled Vital Signs Assessment: post-procedure vital signs reviewed and stable Respiratory status: spontaneous breathing, nonlabored ventilation and respiratory function stable Cardiovascular status: blood pressure returned to baseline and stable Postop Assessment: no signs of nausea or vomiting Anesthetic complications: no     Last Vitals:  Vitals:   09/27/18 0758 09/27/18 0951  BP: (!) 156/88 130/88  Pulse: 73 77  Resp: 19 18  Temp: (!) 35.5 C 36.5 C  SpO2: 99% 100%    Last Pain:  Vitals:   09/27/18 0951  TempSrc: Oral  PainSc: 0-No pain                 Lyn Joens

## 2018-09-27 NOTE — Op Note (Signed)
OPERATIVE NOTE  Howard Anderson [id number] 09/27/2018  PREOPERATIVE DIAGNOSIS:   Nuclear sclerotic cataract left eye with miotic pupil      H25.12   POSTOPERATIVE DIAGNOSIS:   Nuclear sclerotic cataract left eye with miotic pupil.     PROCEDURE:  Phacoemulsification with posterior chamber intraocular lens implantation of the left eye which required pupil stretching with the Malyugin pupil expansion device   LENS:   Implant Name Type Inv. Item Serial No. Manufacturer Lot No. LRB No. Used  LENS IOL ACRYSOF IQ 24.5 - [id number] Intraocular Lens LENS IOL ACRYSOF IQ 24.5 [id number] ALCON  Left 1        ULTRASOUND TIME: 15 % of 0 minutes, 42 seconds.  CDE 5.2   SURGEON:  Wyonia Hough, MD   ANESTHESIA: Topical with tetracaine drops and 2% Xylocaine jelly, augmented with 1% preservative-free intracameral lidocaine.   COMPLICATIONS:  None.   DESCRIPTION OF PROCEDURE:  The patient was identified in the holding room and transported to the operating room and placed in the supine position under the operating microscope.  The left eye was identified as the operative eye and it was prepped and draped in the usual sterile ophthalmic fashion.   A 1 millimeter clear-corneal paracentesis was made at the 1:30 position.  The anterior chamber was filled with Viscoat viscoelastic.  0.5 ml of preservative-free 1% lidocaine was injected into the anterior chamber.  A 2.4 millimeter keratome was used to make a near-clear corneal incision at the 10:30 position.  A Malyugin pupil expander was then placed through the main incision and into the anterior chamber of the eye.  The edge of the iris was secured on the lip of the pupil expander and it was released, thereby expanding the pupil to approximately 7 millimeters for completion of the cataract surgery.  Additional Viscoat was placed in the anterior chamber.  A cystotome and capsulorrhexis forceps were used to make a curvilinear capsulorrhexis.   Balanced  salt solution was used to hydrodissect and hydrodelineate the lens nucleus.   Phacoemulsification was used in stop and chop fashion to remove the lens, nucleus and epinucleus.  The remaining cortex was aspirated using the irrigation aspiration handpiece.  Additional Provisc was placed into the eye to distend the capsular bag for lens placement.  A lens was then injected into the capsular bag.  The pupil expanding ring was removed using a Kuglen hook and insertion device. The remaining viscoelastic was aspirated from the capsular bag and the anterior chamber.  The anterior chamber was filled with balanced salt solution to inflate to a physiologic pressure.   Wounds were hydrated with balanced salt solution.  The anterior chamber was inflated to a physiologic pressure with balanced salt solution.  No wound leaks were noted. Miostat was placed into the anterior chamber.  Vigamox 0.2 ml of a 1mg  per ml solution was injected into the anterior chamber for a dose of 0.2 mg of intracameral antibiotic at the completion of the case.   The patient was taken to the recovery room in stable condition without complications of anesthesia or surgery.  Howard Anderson 09/27/2018, 9:50 AM

## 2018-09-27 NOTE — OR Nursing (Signed)
Pt advised he didn't have omni drops at home.  Juanda Bond will pick up at [facility] on the way home.

## 2018-09-27 NOTE — H&P (Signed)

## 2018-09-27 NOTE — Discharge Instructions (Addendum)
Eye Surgery Discharge Instructions  Expect mild scratchy sensation or mild soreness. DO NOT RUB YOUR EYE!  The day of surgery:  Minimal physical activity, but bed rest is not required  No reading, computer work, or close hand work  No bending, lifting, or straining.  May watch TV  For 24 hours:  No driving, legal decisions, or alcoholic beverages  Safety precautions  Eat anything you prefer: It is better to start with liquids, then soup then solid foods.  Solar shield eyeglasses should be worn for comfort in the sunlight/patch while sleeping  Resume all regular medications including aspirin or Coumadin if these were discontinued prior to surgery. You may shower, bathe, shave, or wash your hair. Tylenol may be taken for mild discomfort. Follow Dr. Larrie Kass eye drop instruction sheet as reviewed.  Call your doctor if you experience significant pain, nausea, or vomiting, fever > 101 or other signs of infection. [phone] or [phone] Specific instructions:  Follow-up Information    Leandrew Koyanagi, MD Follow up.   Specialty:  Ophthalmology Why:  09/28/18 @ 11:10 am Contact information: [address]   [place] [address] [phone]

## 2018-09-27 NOTE — Anesthesia Preprocedure Evaluation (Signed)
Anesthesia Evaluation  Patient identified by MRN, date of birth, ID band Patient awake    Reviewed: Allergy & Precautions, NPO status , Patient's Chart, lab work & pertinent test results  History of Anesthesia Complications Negative for: history of anesthetic complications  Airway Mallampati: III  TM Distance: >3 FB Neck ROM: Full    Dental  (+) Implants   Pulmonary neg sleep apnea, COPD,  COPD inhaler, former smoker,    breath sounds clear to auscultation- rhonchi (-) wheezing      Cardiovascular hypertension, Pt. on medications + Peripheral Vascular Disease (s/p AAA repair)  (-) CAD, (-) Past MI, (-) Cardiac Stents and (-) CABG  Rhythm:Regular Rate:Normal - Systolic murmurs and - Diastolic murmurs    Neuro/Psych neg Seizures PSYCHIATRIC DISORDERS Depression CVA, No Residual Symptoms    GI/Hepatic Neg liver ROS, GERD  ,  Endo/Other  negative endocrine ROS  Renal/GU negative Renal ROS     Musculoskeletal  (+) Arthritis ,   Abdominal (+) + obese,   Peds  Hematology negative hematology ROS (+)   Anesthesia Other Findings Past Medical History: No date: AAA (abdominal aortic aneurysm) without rupture (HCC) No date: AAA (abdominal aortic aneurysm) without rupture (HCC) No date: Cancer ([place])     Comment:  prostate cancer No date: Chest pain No date: COPD (chronic obstructive pulmonary disease) (HCC) No date: Depression No date: Dizziness and giddiness No date: Dvt femoral (deep venous thrombosis) (HCC)     Comment:  H/O LEG No date: GERD (gastroesophageal reflux disease) No date: Heart disease No date: Hyperlipidemia No date: Hypertension No date: Parkinson disease ([place]) No date: Prostate cancer ([place]) No date: Shortness of breath dyspnea     Comment:  DOE No date: Shoulder fracture, right No date: Stroke [facility])     Comment:  tia   Reproductive/Obstetrics                              Anesthesia Physical Anesthesia Plan  ASA: III  Anesthesia Plan: MAC   Post-op Pain Management:    Induction: Intravenous  PONV Risk Score and Plan: 1 and Midazolam  Airway Management Planned: Natural Airway  Additional Equipment:   Intra-op Plan:   Post-operative Plan:   Informed Consent: I have reviewed the patients History and Physical, chart, labs and discussed the procedure including the risks, benefits and alternatives for the proposed anesthesia with the patient or authorized representative who has indicated his/her understanding and acceptance.       Plan Discussed with: CRNA and Anesthesiologist  Anesthesia Plan Comments:         Anesthesia Quick Evaluation

## 2018-10-02 ENCOUNTER — Ambulatory Visit: Payer: Medicare Other | Attending: Neurology | Admitting: Occupational Therapy

## 2018-10-02 ENCOUNTER — Encounter: Payer: Self-pay | Admitting: Occupational Therapy

## 2018-10-02 ENCOUNTER — Other Ambulatory Visit: Payer: Self-pay

## 2018-10-02 DIAGNOSIS — R2681 Unsteadiness on feet: Secondary | ICD-10-CM | POA: Diagnosis not present

## 2018-10-02 DIAGNOSIS — R262 Difficulty in walking, not elsewhere classified: Secondary | ICD-10-CM | POA: Insufficient documentation

## 2018-10-02 DIAGNOSIS — R278 Other lack of coordination: Secondary | ICD-10-CM | POA: Insufficient documentation

## 2018-10-02 DIAGNOSIS — M6281 Muscle weakness (generalized): Secondary | ICD-10-CM | POA: Insufficient documentation

## 2018-10-07 ENCOUNTER — Other Ambulatory Visit: Payer: Self-pay | Admitting: Family Medicine

## 2018-10-07 DIAGNOSIS — C61 Malignant neoplasm of prostate: Secondary | ICD-10-CM

## 2018-10-07 DIAGNOSIS — E782 Mixed hyperlipidemia: Secondary | ICD-10-CM

## 2018-10-09 ENCOUNTER — Ambulatory Visit (INDEPENDENT_AMBULATORY_CARE_PROVIDER_SITE_OTHER): Payer: Medicare Other | Admitting: Urology

## 2018-10-09 ENCOUNTER — Encounter: Payer: Self-pay | Admitting: Urology

## 2018-10-09 ENCOUNTER — Ambulatory Visit: Payer: Medicare Other | Admitting: Occupational Therapy

## 2018-10-09 VITALS — BP 124/79 | HR 76 | Ht 67.0 in | Wt 203.0 lb

## 2018-10-09 DIAGNOSIS — R278 Other lack of coordination: Secondary | ICD-10-CM

## 2018-10-09 DIAGNOSIS — R2681 Unsteadiness on feet: Secondary | ICD-10-CM | POA: Diagnosis not present

## 2018-10-09 DIAGNOSIS — R262 Difficulty in walking, not elsewhere classified: Secondary | ICD-10-CM | POA: Diagnosis not present

## 2018-10-09 DIAGNOSIS — M6281 Muscle weakness (generalized): Secondary | ICD-10-CM | POA: Diagnosis not present

## 2018-10-09 DIAGNOSIS — C61 Malignant neoplasm of prostate: Secondary | ICD-10-CM | POA: Diagnosis not present

## 2018-10-09 NOTE — Progress Notes (Signed)
10/09/2018 9:01 PM   Howard Anderson [birth year] [id number]  Referring provider: Juline Patch, MD [address] [place], [place] [address]  Chief Complaint  Patient presents with  . Prostate Cancer    Follow up   Urologic history: 1.  T1c intermediate risk adenocarcinoma the prostate  -Prostate biopsy 10/2015; PSA 10.8; volume 58 cc  -12/12 cores positive Gleason 3+4 adenocarcinoma  -CT without adenopathy or extraprostatic pelvic disease  -IMRT completed August 2017  2.  History microhematuria  -Cystoscopy performed 10/2015 with prostatic enlargement/hypervascularity  -No upper tract abnormalities CTU  HPI: 73 year old male presents for annual urologic follow-up.  He states he has been doing well and has no bothersome lower urinary tract symptoms.  He was seen by radiation oncology August 2019 and his PSA was 0.53 which is his nadir thus far. Denies dysuria, gross hematuria or flank/abdominal/pelvic/scrotal pain.   PMH: Past Medical History:  Diagnosis Date  . AAA (abdominal aortic aneurysm) without rupture ([place])   . AAA (abdominal aortic aneurysm) without rupture ([place])   . Cancer [facility])    prostate cancer  . Chest pain   . COPD (chronic obstructive pulmonary disease) ([place])   . Depression   . Dizziness and giddiness   . Dvt femoral (deep venous thrombosis) ([facility])    H/O LEG  . GERD (gastroesophageal reflux disease)   . Heart disease   . Hyperlipidemia   . Hypertension   . Parkinson disease ([place])   . Prostate cancer ([place])   . Shortness of breath dyspnea    DOE  . Shoulder fracture, right   . Stroke [facility])    tia    Surgical History: Past Surgical History:  Procedure Laterality Date  . CATARACT EXTRACTION W/PHACO Left 09/27/2018   Procedure: CATARACT EXTRACTION PHACO AND INTRAOCULAR LENS PLACEMENT ([place]) LEFT;  Surgeon: Leandrew Koyanagi, MD;  Location: [facility] ORS;  Service: Ophthalmology;  Laterality: Left;  [place]  00:42 AP% 15.1 CDE 5.16 Fluid pack lot  # [id number]  . COLONOSCOPY  2010  . EMBOLECTOMY Left 06/20/2015   Procedure: EMBOLECTOMY;  Surgeon: [name], MD;  Location: [facility] ORS;  Service: Vascular;  Laterality: Left;  brachial thrombectomy  . ENDARTERECTOMY FEMORAL Left 06/20/2015   Procedure: ENDARTERECTOMY FEMORAL/FEMORAL PSEUDOANEURYSM repair;  Surgeon: [name], MD;  Location: [facility] ORS;  Service: Vascular;  Laterality: Left;  femerol  . FRACTURE SURGERY Right    SHOULDER  . INNER EAR SURGERY    . MANDIBLE SURGERY     TUMOR  . PERIPHERAL VASCULAR CATHETERIZATION N/A 05/21/2015   Procedure: Endovascular Repair/Stent Graft;  Surgeon: [name], MD;  Location: [place] CV LAB;  Service: Cardiovascular;  Laterality: N/A;  . PERIPHERAL VASCULAR CATHETERIZATION  06/20/2015   Procedure: Lower Extremity Angiography;  Surgeon: [name], MD;  Location: [place] CV LAB;  Service: Cardiovascular;;  . PERIPHERAL VASCULAR CATHETERIZATION  06/20/2015   Procedure: Lower Extremity Intervention;  Surgeon: [name], MD;  Location: [place] CV LAB;  Service: Cardiovascular;;    Home Medications:  Allergies as of 10/09/2018   No Known Allergies     Medication List       Accurate as of October 09, 2018  9:01 PM. Always use your most recent med list.        aspirin 81 MG tablet Take 1 tablet (81 mg total) by mouth daily.   atorvastatin 10 MG tablet Commonly known as:  LIPITOR TAKE ONE TABLET EVERY DAY  carbidopa-levodopa 25-100 MG tablet Commonly known as:  SINEMET IR Take 1.5 tablets by mouth 4 (four) times daily.   Combigan 0.2-0.5 % ophthalmic solution Generic drug:  brimonidine-timolol Place 1 drop into both eyes every 12 (twelve) hours.   Cystex Liqd Take 1 Dose by mouth daily.   dorzolamide 2 % ophthalmic solution Commonly known as:  TRUSOPT Place 1 drop into both eyes 2 (two) times daily.   doxycycline 20 MG tablet Commonly known as:  PERIOSTAT Take 1 tablet (20  mg total) by mouth 2 (two) times daily.   Fluticasone-Salmeterol 250-50 MCG/DOSE Aepb Commonly known as:  Wixela Inhub TAKE 1 PUFF BY MOUTH TWICE A DAY   latanoprost 0.005 % ophthalmic solution Commonly known as:  XALATAN Place 1 drop into both eyes at bedtime.   mirabegron ER 25 MG Tb24 tablet Commonly known as:  MYRBETRIQ Take 1 tablet (25 mg total) by mouth daily.   PRESERVISION AREDS PO Take 1 capsule by mouth 2 (two) times daily.   ranitidine 150 MG tablet Commonly known as:  ZANTAC Take 0.5 tablets (75 mg total) by mouth daily.   sertraline 50 MG tablet Commonly known as:  ZOLOFT Take 1 tablet (50 mg total) by mouth daily.   tamsulosin 0.4 MG Caps capsule Commonly known as:  FLOMAX TAKE 1 CAPSULE EVERY DAY NEEDS APPT FOR MORE   traZODone 50 MG tablet Commonly known as:  DESYREL Take 50 mg by mouth at bedtime.   valsartan 80 MG tablet Commonly known as:  DIOVAN TAKE 1 TABLET (80 MG TOTAL) BY MOUTH ONCE DAILY.   vitamin B-12 1000 MCG tablet Commonly known as:  CYANOCOBALAMIN Take 1,000 mcg by mouth daily.   Vitamin D3 25 MCG (1000 UT) Caps Take 1,000 Units by mouth daily.       Allergies: No Known Allergies  Family History: Family History  Problem Relation Age of Onset  . Prostate cancer Brother   . Bladder Cancer Neg Hx   . Kidney cancer Neg Hx     Social History:  reports that he quit smoking about 5 years ago. He has a 72.00 pack-year smoking history. He has never used smokeless tobacco. He reports that he does not drink alcohol or use drugs.  ROS: UROLOGY Frequent Urination?: No Hard to postpone urination?: No Burning/pain with urination?: No Get up at night to urinate?: No Leakage of urine?: No Urine stream starts and stops?: No Trouble starting stream?: No Do you have to strain to urinate?: No Blood in urine?: No Urinary tract infection?: No Sexually transmitted disease?: No Injury to kidneys or bladder?: No Painful intercourse?:  No Weak stream?: No Erection problems?: No Penile pain?: No  Gastrointestinal Nausea?: No Vomiting?: No Indigestion/heartburn?: No Diarrhea?: No Constipation?: No  Constitutional Fever: No Night sweats?: No Weight loss?: No Fatigue?: No  Skin Skin rash/lesions?: No Itching?: No  Eyes Blurred vision?: No Double vision?: No  Ears/Nose/Throat Sore throat?: No Sinus problems?: No  Hematologic/Lymphatic Swollen glands?: No Easy bruising?: No  Cardiovascular Leg swelling?: No Chest pain?: No  Respiratory Cough?: No Shortness of breath?: No  Endocrine Excessive thirst?: No  Musculoskeletal Back pain?: No Joint pain?: No  Neurological Headaches?: No Dizziness?: Yes  Psychologic Depression?: Yes Anxiety?: No  Physical Exam: BP 124/79   Pulse 76   Ht 5\' 7"  (1.702 m)   Wt 203 lb (92.1 kg)   BMI 31.79 kg/m   Constitutional:  Alert and oriented, No acute distress. HEENT: [place] AT, moist mucus membranes.  Trachea midline, no masses. Cardiovascular: No clubbing, cyanosis, or edema. Respiratory: Normal respiratory effort, no increased work of breathing. Skin: No rashes, bruises or suspicious lesions. Neurologic: Grossly intact, no focal deficits, moving all 4 extremities. Psychiatric: Normal mood and affect.   Assessment & Plan:    1. Prostate cancer ([place]) Status post IMRT for intermediate risk prostate cancer.  PSA remains low and has continued to decrease.  PSA was drawn today.  He has a follow-up in radiation oncology in 6 months and we will see him back in 1 year.   Abbie Sons, [place] [address], [place], [place] [address] [phone]

## 2018-10-10 ENCOUNTER — Ambulatory Visit: Payer: Medicare Other | Admitting: Occupational Therapy

## 2018-10-10 DIAGNOSIS — R2681 Unsteadiness on feet: Secondary | ICD-10-CM

## 2018-10-10 DIAGNOSIS — M6281 Muscle weakness (generalized): Secondary | ICD-10-CM

## 2018-10-10 DIAGNOSIS — R262 Difficulty in walking, not elsewhere classified: Secondary | ICD-10-CM | POA: Diagnosis not present

## 2018-10-10 DIAGNOSIS — R278 Other lack of coordination: Secondary | ICD-10-CM

## 2018-10-10 LAB — PSA: Prostate Specific Ag, Serum: 0.5 ng/mL (ref 0.0–4.0)

## 2018-10-11 ENCOUNTER — Ambulatory Visit: Payer: Medicare Other | Admitting: Occupational Therapy

## 2018-10-11 ENCOUNTER — Telehealth: Payer: Self-pay | Admitting: *Deleted

## 2018-10-11 ENCOUNTER — Other Ambulatory Visit: Payer: Self-pay

## 2018-10-11 DIAGNOSIS — M6281 Muscle weakness (generalized): Secondary | ICD-10-CM

## 2018-10-11 DIAGNOSIS — R262 Difficulty in walking, not elsewhere classified: Secondary | ICD-10-CM

## 2018-10-11 DIAGNOSIS — R2681 Unsteadiness on feet: Secondary | ICD-10-CM | POA: Diagnosis not present

## 2018-10-11 DIAGNOSIS — R278 Other lack of coordination: Secondary | ICD-10-CM

## 2018-10-11 NOTE — Telephone Encounter (Signed)
Pt returned call and I read results from [facility]

## 2018-10-11 NOTE — Telephone Encounter (Signed)
-----   Message from Abbie Sons, MD sent at 10/10/2018  9:15 PM EDT ----- PSA stable at 0.5

## 2018-10-12 ENCOUNTER — Ambulatory Visit: Payer: Medicare Other | Admitting: Occupational Therapy

## 2018-10-12 DIAGNOSIS — M6281 Muscle weakness (generalized): Secondary | ICD-10-CM

## 2018-10-12 DIAGNOSIS — R278 Other lack of coordination: Secondary | ICD-10-CM

## 2018-10-12 DIAGNOSIS — R2681 Unsteadiness on feet: Secondary | ICD-10-CM

## 2018-10-12 DIAGNOSIS — R262 Difficulty in walking, not elsewhere classified: Secondary | ICD-10-CM

## 2018-10-14 ENCOUNTER — Encounter: Payer: Self-pay | Admitting: Occupational Therapy

## 2018-10-14 NOTE — Therapy (Signed)
[place] MAIN [facility] SERVICES [address] [place], [place], [address] Phone: [phone]   Fax:  [phone]  Occupational Therapy Treatment  Patient Details  Name: MATIAS THURMAN MRN: [id number] Date of Birth: 1945-10-07 Referring Provider (OT): Jennings Books   Encounter Date: 10/11/2018  OT End of Session - 10/14/18 1631    Visit Number  4    Number of Visits  17    Date for OT Re-Evaluation  11/10/18    OT Start Time  1300    OT Stop Time  1357    OT Time Calculation (min)  57 min    Activity Tolerance  Patient tolerated treatment well    Behavior During Therapy  [facility] for tasks assessed/performed       Past Medical History:  Diagnosis Date  . AAA (abdominal aortic aneurysm) without rupture ([place])   . AAA (abdominal aortic aneurysm) without rupture ([place])   . Cancer [facility])    prostate cancer  . Chest pain   . COPD (chronic obstructive pulmonary disease) ([place])   . Depression   . Dizziness and giddiness   . Dvt femoral (deep venous thrombosis) ([facility])    H/O LEG  . GERD (gastroesophageal reflux disease)   . Heart disease   . Hyperlipidemia   . Hypertension   . Parkinson disease ([place])   . Prostate cancer ([place])   . Shortness of breath dyspnea    DOE  . Shoulder fracture, right   . Stroke [facility])    tia    Past Surgical History:  Procedure Laterality Date  . CATARACT EXTRACTION W/PHACO Left 09/27/2018   Procedure: CATARACT EXTRACTION PHACO AND INTRAOCULAR LENS PLACEMENT ([place]) LEFT;  Surgeon: Leandrew Koyanagi, MD;  Location: [facility] ORS;  Service: Ophthalmology;  Laterality: Left;  [place]  00:42 AP% 15.1 CDE 5.16 Fluid pack lot # [id number]  . COLONOSCOPY  2010  . EMBOLECTOMY Left 06/20/2015   Procedure: EMBOLECTOMY;  Surgeon: [name], MD;  Location: [facility] ORS;  Service: Vascular;  Laterality: Left;  brachial thrombectomy  . ENDARTERECTOMY FEMORAL Left 06/20/2015   Procedure: ENDARTERECTOMY FEMORAL/FEMORAL PSEUDOANEURYSM  repair;  Surgeon: [name], MD;  Location: [facility] ORS;  Service: Vascular;  Laterality: Left;  femerol  . FRACTURE SURGERY Right    SHOULDER  . INNER EAR SURGERY    . MANDIBLE SURGERY     TUMOR  . PERIPHERAL VASCULAR CATHETERIZATION N/A 05/21/2015   Procedure: Endovascular Repair/Stent Graft;  Surgeon: [name], MD;  Location: [place] CV LAB;  Service: Cardiovascular;  Laterality: N/A;  . PERIPHERAL VASCULAR CATHETERIZATION  06/20/2015   Procedure: Lower Extremity Angiography;  Surgeon: [name], MD;  Location: [place] [facility];  Service: Cardiovascular;;  . PERIPHERAL VASCULAR CATHETERIZATION  06/20/2015   Procedure: Lower Extremity Intervention;  Surgeon: [name], MD;  Location: [place] [facility];  Service: Cardiovascular;;    There were no vitals filed for this visit.  Subjective Assessment - 10/14/18 1630    Subjective   Patient reports he is still a little sore from exercises, not painful.  Right arm feels stiff but no pain.      Pertinent History  Patient with recent diagnosis of Parkinson's disease last fall.  Patient's wife passed away during the same time frame and now patient lives alone and has no family nearby.     Patient Stated Goals  Patient reports he would like to improve his balance and walking and remain independent  as possible.     Currently in Pain?  No/denies    Multiple Pain Sites  No        Neuromuscular Reeducation:    Patient seen for instruction of LSVT BIG exercises: LSVT Daily Session Maximal Daily Exercises:  Sustained movements are designed to rescale the amplitude of movement output for generalization  to daily functional activities. Performed as follows for 1 set of 10 repetitions each: Multi directional  sustained movements- 1) Floor to ceiling, 2) Side to side. Multi directional Repetitive movements  performed in standing and are designed to provide retraining effort needed for sustained muscle   activation in tasks Performed as follows: 3) Step and reach forward, 4) Step and Reach Backwards,  5) Step and reach sideways, 6) Rock and reach forward/backward, 7) Rock and reach sideways.    Performed in standard version with min guard in standing for balance and min verbal cues for proper form and technique.   Functional mobility without assistive device one trial of 250 feet, another trial of 100 feet, cues for amplitude of gait.  Focused on turns this date with attempts to work on segmental turns.     Response to tx:  Issued written/pictorial instructions this date for LSVT BIG exercises to work towards performing home program.  Patient able to demonstrate understanding and continues to requires minimal cues.  Recommend patient use chair at home when performing exercises in order to adapt as needed for balance for the next few days as he gets used to performing.  Patient continues to require verbal cues for reciprocal arm swing while ambulating.  Patient with limited toleration to longer distance ambulation and requires short rest breaks as needed.  Continue to work towards goals in plan of care.  Add functional component tasks in the next 1-2 sessions.              OT Education - 10/14/18 1630    Education Details  LSVT BIG maximal daily exercises in standard version    Person(s) Educated  Patient    Methods  Explanation;Demonstration;Verbal cues;Handout    Comprehension  Verbalized understanding;Verbal cues required;Returned demonstration;Need further instruction          OT Long Term Goals - 10/14/18 1034      OT LONG TERM GOAL #1   Title  Patient will improve gait speed and endurance and be able to walk 700 feet in 6 minutes to negotiate around the home and community safely in 4 weeks     Baseline  6 min walk test eval 560 feet    Time  4    Period  Weeks    Status  New    Target Date  11/10/18      OT LONG TERM GOAL #2   Title  Patient will complete HEP for  maximal daily exercises with modified independence in 4 weeks       Baseline  no current program     Time  4    Period  Weeks    Status  New    Target Date  11/10/18      OT LONG TERM GOAL #3   Title  Patient will transfer from sit to stand without the use of arms safely and independently from a variety of chairs/surfaces in 4 weeks.    Baseline  demonstrates difficulty with sit to stand from low surfaces.     Time  4    Period  Weeks    Status  New    Target Date  11/10/18      OT LONG TERM GOAL #4   Title  Patient will demonstrate reciprocal arm swing during functional mobility without cues.     Baseline  reciprocal arm swing absent on eval.     Time  4    Period  Weeks    Status  New    Target Date  11/10/18            Plan - 10/14/18 1631    Clinical Impression Statement  Issued written/pictorial instructions this date for LSVT BIG exercises to work towards performing home program.  Patient able to demonstrate understanding and continues to requires minimal cues.  Recommend patient use chair at home when performing exercises in order to adapt as needed for balance for the next few days as he gets used to performing.  Patient continues to require verbal cues for reciprocal arm swing while ambulating.  Patient with limited toleration to longer distance ambulation and requires short rest breaks as needed.  Continue to work towards goals in plan of care.  Add functional component tasks in the next 1-2 sessions.     OT Occupational Profile and History  Detailed Assessment- Review of Records and additional review of physical, cognitive, psychosocial history related to current functional performance    Occupational performance deficits (Please refer to evaluation for details):  ADL's;IADL's;Leisure;Social Participation    Body Structure / Function / Physical Skills  ADL;Dexterity;Flexibility;Strength;Balance;Coordination;FMC;IADL;Endurance;Gait;UE functional use;Mobility    Cognitive  Skills  Memory    Psychosocial Skills  Routines and Behaviors;Coping Strategies    Rehab Potential  Good    Clinical Decision Making  Several treatment options, min-mod task modification necessary    Comorbidities Affecting Occupational Performance:  May have comorbidities impacting occupational performance    Modification or Assistance to Complete Evaluation   Min-Moderate modification of tasks or assist with assess necessary to complete eval    OT Frequency  4x / week    OT Duration  4 weeks    OT Treatment/Interventions  Self-care/ADL training;Therapeutic exercise;DME and/or AE instruction;Functional Mobility Training;Cognitive remediation/compensation;Balance training;Neuromuscular education;Gait Optician, dispensing;Therapeutic activities;Patient/family education;Coping strategies training    Consulted and Agree with Plan of Care  Patient       Patient will benefit from skilled therapeutic intervention in order to improve the following deficits and impairments:  Body Structure / Function / Physical Skills, Psychosocial Skills, Cognitive Skills  Visit Diagnosis: Difficulty in walking, not elsewhere classified  Muscle weakness (generalized)  Other lack of coordination  Unsteadiness on feet    Problem List Patient Active Problem List   Diagnosis Date Noted  . Shoulder pain 08/11/2017  . Urinary urgency 08/11/2017  . Venous insufficiency of both lower extremities 10/25/2016  . Bilateral leg edema 10/19/2016  . Prostate cancer ([place]) 09/27/2016  . Essential hypertension 10/29/2015  . Hyperlipidemia 10/29/2015  . Esophageal reflux 10/29/2015  . Centrilobular emphysema ([place]) 10/29/2015  . Labyrinthitis 10/29/2015  . Microhematuria 09/29/2015  . Femoral artery aneurysm, left ([place]) 06/22/2015  . Cerebral vascular disease 06/21/2015  . AAA (abdominal aortic aneurysm) ([place]) 05/21/2015  . Degenerative disc disease, lumbar 04/24/2015  . Urinary incontinence, male,  stress 04/24/2015  . Dizziness 01/20/2015  . Endocarditis 01/20/2015  . Aneurysm of abdominal vessel ([place]) 08/30/2014  . Chronic obstructive pulmonary disease ([place]) 08/30/2014  . Personal history of transient ischemic attack (TIA), and cerebral infarction without residual deficits 08/30/2014   Carson Meche T Dealva Lafoy, OTR/L, CLT  Ammarie Matsuura 10/14/2018,  4:40 PM  [place] MAIN [facility] SERVICES [address] [place], [place], [address] Phone: [phone]   Fax:  [phone]  Name: EFSTATHIOS SAWIN MRN: [id number] Date of Birth: 1946-07-21

## 2018-10-14 NOTE — Therapy (Signed)
[place] MAIN [facility] SERVICES [address] [place], [place], [address] Phone: [phone]   Fax:  [phone]  Occupational Therapy Evaluation  Patient Details  Name: Howard Anderson MRN: [id number] Date of Birth: 07-24-1946 Referring Provider (OT): Jennings Books   Encounter Date: 10/02/2018    Past Medical History:  Diagnosis Date  . AAA (abdominal aortic aneurysm) without rupture ([place])   . AAA (abdominal aortic aneurysm) without rupture ([place])   . Cancer [facility])    prostate cancer  . Chest pain   . COPD (chronic obstructive pulmonary disease) ([place])   . Depression   . Dizziness and giddiness   . Dvt femoral (deep venous thrombosis) ([facility])    H/O LEG  . GERD (gastroesophageal reflux disease)   . Heart disease   . Hyperlipidemia   . Hypertension   . Parkinson disease ([place])   . Prostate cancer ([place])   . Shortness of breath dyspnea    DOE  . Shoulder fracture, right   . Stroke [facility])    tia    Past Surgical History:  Procedure Laterality Date  . CATARACT EXTRACTION W/PHACO Left 09/27/2018   Procedure: CATARACT EXTRACTION PHACO AND INTRAOCULAR LENS PLACEMENT ([place]) LEFT;  Surgeon: Leandrew Koyanagi, MD;  Location: [facility] ORS;  Service: Ophthalmology;  Laterality: Left;  [place]  00:42 AP% 15.1 CDE 5.16 Fluid pack lot # [id number]  . COLONOSCOPY  2010  . EMBOLECTOMY Left 06/20/2015   Procedure: EMBOLECTOMY;  Surgeon: [name], MD;  Location: [facility] ORS;  Service: Vascular;  Laterality: Left;  brachial thrombectomy  . ENDARTERECTOMY FEMORAL Left 06/20/2015   Procedure: ENDARTERECTOMY FEMORAL/FEMORAL PSEUDOANEURYSM repair;  Surgeon: [name], MD;  Location: [facility] ORS;  Service: Vascular;  Laterality: Left;  femerol  . FRACTURE SURGERY Right    SHOULDER  . INNER EAR SURGERY    . MANDIBLE SURGERY     TUMOR  . PERIPHERAL VASCULAR CATHETERIZATION N/A 05/21/2015   Procedure: Endovascular Repair/Stent Graft;  Surgeon: [name], MD;  Location: [place] CV LAB;  Service: Cardiovascular;  Laterality: N/A;  . PERIPHERAL VASCULAR CATHETERIZATION  06/20/2015   Procedure: Lower Extremity Angiography;  Surgeon: [name], MD;  Location: [place] CV LAB;  Service: Cardiovascular;;  . PERIPHERAL VASCULAR CATHETERIZATION  06/20/2015   Procedure: Lower Extremity Intervention;  Surgeon: [name], MD;  Location: [place] CV LAB;  Service: Cardiovascular;;    There were no vitals filed for this visit.  Subjective Assessment - 10/14/18 1023    Subjective   Patient reports he was recently diagnosed with Parkinson's disease and his wife also passed away this past fall.     Patient Stated Goals  Patient reports he would like to improve his balance and walking and remain independent as possible.     Currently in Pain?  No/denies    Pain Score  0-No pain        [facility] OT Assessment - 10/14/18 1045      Assessment   Medical Diagnosis  Parkinson's disease    Referring Provider (OT)  Manuella Ghazi, [place]    Hand Dominance  Right    Prior Therapy  none      Precautions   Precautions  None      Restrictions   Weight Bearing Restrictions  No      Balance Screen   Has the patient fallen in the past 6 months  Yes    How many times?  1  Has the patient had a decrease in activity level because of a fear of falling?   No    Is the patient reluctant to leave their home because of a fear of falling?   No      Home  Environment   Family/patient expects to be discharged to:  Private residence    Living Arrangements  Alone    Type of [place]  One level    Alternate Level Stairs - Number of Steps  3 steps to enter with handrail    Bathroom Shower/Tub  Walk-in Catering manager  Yes    Home Equipment  None    Lives With  Alone      Prior Function   Level of Independence  Independent    Vocation  Full  time employment    Psychologist, sport and exercise    Leisure  work, likes the outdoors      ADL   Eating/Feeding  Modified independent    Grooming  Modified independent    Upper Body Bathing  Modified independent    Lower Body Bathing  Modified independent    Upper Body Dressing  Increased time    Lower Body Dressing  Increased time    Armed forces technical officer  Modified independent    Toileting - Clothing Manipulation  Modified independent    Tub/Shower Transfer  Modified independent    ADL comments  Patient requires increased time to complete most self care tasks, especially with dressing skills.  He has not done much food preparation and picks up food most days.  He hires someone to do his yardwork.  He does some light homemaking but has difficulty with heavy cleaning and does not have any local family.  He has no kids.       IADL   Prior Level of Function Shopping  independent    Shopping  Shops independently for small purchases    Prior Level of Function Light Housekeeping  independent    Light Housekeeping  Performs light daily tasks such as dishwashing, bed making    Prior Level of Function Meal Prep  independent    Meal Prep  Able to complete simple warm meal prep    Prior Level of Function Metallurgist own vehicle    Prior Level of Function Medication Managment  independent    Medication Management  Takes responsibility if medication is prepared in advance in seperate dosage    Prior Level of Function Financial Management  independent    Psychiatrist financial matters independently (budgets, writes checks, pays rent, bills goes to bank), collects and keeps track of income      Mobility   Mobility Status  History of falls;Independent    Mobility Status Comments  last fall was 03/07/18      Written Expression   Dominant Hand  Right      Vision - History   Baseline Vision  Wears glasses all the time    Visual  History  Cataracts    Additional Comments  recent cataract surgery on the left, 2/26 last wednesday       Cognition   Overall Cognitive Status  Within Functional Limits for tasks assessed      Sensation   Light Touch  Appears Intact  Stereognosis  Appears Intact    Hot/Cold  Appears Intact    Proprioception  Appears Intact      Coordination   Gross Motor Movements are Fluid and Coordinated  No    Fine Motor Movements are Fluid and Coordinated  No    Finger Nose Finger Test  impaired, left hand tremor noted    9 Hole Peg Test  Right;Left    Right 9 Hole Peg Test  34 secs    Left 9 Hole Peg Test  37 secs      ROM / Strength   AROM / PROM / Strength  AROM;Strength      Hand Function   Right Hand Grip (lbs)  45    Left Hand Grip (lbs)  50        6 minute walk test 560 feet without use of assistive device, absent arm swing, demonstrates en bloc turns, has some difficulty with executing turns, shortened gait, right foot drag at times 5 times sit to stand 17 secs from mat table, difficulty with sit to stand   Patient with left hand tremor at times Bradykinesia right greater than left Evidence of cogwheeling right greater than left Balance noted to be impaired functionality with transitional movements and turns.     Decreased ROM right shoulder, 115 degrees shoulder flexion, LUE WFLs Strength 3-/5 right shoulder, left 4/5, elbows 4/5           OT Education - 10/14/18 1025    Education Details  Role of OT, LSVT BIG program and principles, goals    Person(s) Educated  Patient    Methods  Explanation    Comprehension  Verbalized understanding          OT Long Term Goals - 10/14/18 1034      OT LONG TERM GOAL #1   Title  Patient will improve gait speed and endurance and be able to walk 700 feet in 6 minutes to negotiate around the home and community safely in 4 weeks     Baseline  6 min walk test eval 560 feet    Time  4    Period  Weeks    Status  New     Target Date  11/10/18      OT LONG TERM GOAL #2   Title  Patient will complete HEP for maximal daily exercises with modified independence in 4 weeks       Baseline  no current program     Time  4    Period  Weeks    Status  New    Target Date  11/10/18      OT LONG TERM GOAL #3   Title  Patient will transfer from sit to stand without the use of arms safely and independently from a variety of chairs/surfaces in 4 weeks.    Baseline  demonstrates difficulty with sit to stand from low surfaces.     Time  4    Period  Weeks    Status  New    Target Date  11/10/18      OT LONG TERM GOAL #4   Title  Patient will demonstrate reciprocal arm swing during functional mobility without cues.     Baseline  reciprocal arm swing absent on eval.     Time  4    Period  Weeks    Status  New    Target Date  11/10/18  Plan - 10/14/18 1026    Clinical Impression Statement  Patient is a 73 yo male diagnosed with Parkinson's disease and was referred by his physician for LSVT BIG program. Patient presents with mild decreased step length with gait patterns, decreased reciprocal arm swing, decreased balance, decreased coordination, and muscle strength which affect his ability to perform daily tasks. The patient is judged to be an excellent candidate for the LSVT BIG program. He would benefit from and was referred for the LSVT BIG program which is an intensive program designed specifically for Parkinson's patients with a focus on increasing amplitude and speed of movements, improving self-care and daily tasks and providing patients with daily exercises to improve overall function. It is recommended that the patient receive the LSVT BIG program which is comprised of 16 intensive sessions (4 times per week for 4 weeks, one hour sessions). Prognosis for improvement is good based on his motivation and family support. LSVT BIG has been documented in the literature as efficacious for individuals with  Parkinson's disease.        OT Occupational Profile and History  Detailed Assessment- Review of Records and additional review of physical, cognitive, psychosocial history related to current functional performance    Occupational performance deficits (Please refer to evaluation for details):  ADL's;IADL's;Leisure;Social Participation    Body Structure / Function / Physical Skills  ADL;Dexterity;Flexibility;Strength;Balance;Coordination;FMC;IADL;Endurance;Gait;UE functional use;Mobility    Cognitive Skills  Memory    Psychosocial Skills  Routines and Behaviors;Coping Strategies    Rehab Potential  Good    Clinical Decision Making  Several treatment options, min-mod task modification necessary    Comorbidities Affecting Occupational Performance:  May have comorbidities impacting occupational performance    Modification or Assistance to Complete Evaluation   Min-Moderate modification of tasks or assist with assess necessary to complete eval    OT Frequency  4x / week    OT Duration  4 weeks    OT Treatment/Interventions  Self-care/ADL training;Therapeutic exercise;DME and/or AE instruction;Functional Mobility Training;Cognitive remediation/compensation;Balance training;Neuromuscular education;Gait Optician, dispensing;Therapeutic activities;Patient/family education;Coping strategies training    Consulted and Agree with Plan of Care  Patient       Patient will benefit from skilled therapeutic intervention in order to improve the following deficits and impairments:  Body Structure / Function / Physical Skills, Psychosocial Skills, Cognitive Skills  Visit Diagnosis: Unsteadiness on feet  Difficulty in walking, not elsewhere classified  Muscle weakness (generalized)  Other lack of coordination    Problem List Patient Active Problem List   Diagnosis Date Noted  . Shoulder pain 08/11/2017  . Urinary urgency 08/11/2017  . Venous insufficiency of both lower extremities 10/25/2016   . Bilateral leg edema 10/19/2016  . Prostate cancer ([place]) 09/27/2016  . Essential hypertension 10/29/2015  . Hyperlipidemia 10/29/2015  . Esophageal reflux 10/29/2015  . Centrilobular emphysema ([place]) 10/29/2015  . Labyrinthitis 10/29/2015  . Microhematuria 09/29/2015  . Femoral artery aneurysm, left ([place]) 06/22/2015  . Cerebral vascular disease 06/21/2015  . AAA (abdominal aortic aneurysm) ([place]) 05/21/2015  . Degenerative disc disease, lumbar 04/24/2015  . Urinary incontinence, male, stress 04/24/2015  . Dizziness 01/20/2015  . Endocarditis 01/20/2015  . Aneurysm of abdominal vessel ([employer]) 08/30/2014  . Chronic obstructive pulmonary disease ([place]) 08/30/2014  . Personal history of transient ischemic attack (TIA), and cerebral infarction without residual deficits 08/30/2014   Hortencia Martire T Hollace Michelli, OTR/L, CLT  Barkley Kratochvil 10/14/2018, 10:53 AM  [place] [address] [place], [place], [address]  Phone: [phone]   Fax:  [phone]  Name: KHALEED HOLAN MRN: [id number] Date of Birth: Mar 17, 1946

## 2018-10-14 NOTE — Therapy (Signed)
[place] MAIN [facility] SERVICES [address] [place], [place], [address] Phone: [phone]   Fax:  [phone]  Occupational Therapy Treatment  Patient Details  Name: Howard Anderson MRN: [id number] Date of Birth: May 07, 1946 Referring Provider (OT): Jennings Books   Encounter Date: 10/10/2018  OT End of Session - 10/14/18 1621    Visit Number  3    Number of Visits  17    Date for OT Re-Evaluation  11/10/18    OT Start Time  1330    OT Stop Time  1429    OT Time Calculation (min)  59 min    Activity Tolerance  Patient tolerated treatment well    Behavior During Therapy  [facility] for tasks assessed/performed       Past Medical History:  Diagnosis Date  . AAA (abdominal aortic aneurysm) without rupture ([place])   . AAA (abdominal aortic aneurysm) without rupture ([place])   . Cancer [facility])    prostate cancer  . Chest pain   . COPD (chronic obstructive pulmonary disease) ([place])   . Depression   . Dizziness and giddiness   . Dvt femoral (deep venous thrombosis) ([facility])    H/O LEG  . GERD (gastroesophageal reflux disease)   . Heart disease   . Hyperlipidemia   . Hypertension   . Parkinson disease ([place])   . Prostate cancer ([employer])   . Shortness of breath dyspnea    DOE  . Shoulder fracture, right   . Stroke [facility])    tia    Past Surgical History:  Procedure Laterality Date  . CATARACT EXTRACTION W/PHACO Left 09/27/2018   Procedure: CATARACT EXTRACTION PHACO AND INTRAOCULAR LENS PLACEMENT ([place]) LEFT;  Surgeon: Leandrew Koyanagi, MD;  Location: [facility] ORS;  Service: Ophthalmology;  Laterality: Left;  [place]  00:42 AP% 15.1 CDE 5.16 Fluid pack lot # [id number]  . COLONOSCOPY  2010  . EMBOLECTOMY Left 06/20/2015   Procedure: EMBOLECTOMY;  Surgeon: [name], MD;  Location: [facility] ORS;  Service: Vascular;  Laterality: Left;  brachial thrombectomy  . ENDARTERECTOMY FEMORAL Left 06/20/2015   Procedure: ENDARTERECTOMY FEMORAL/FEMORAL PSEUDOANEURYSM  repair;  Surgeon: [name], MD;  Location: [facility] ORS;  Service: Vascular;  Laterality: Left;  femerol  . FRACTURE SURGERY Right    SHOULDER  . INNER EAR SURGERY    . MANDIBLE SURGERY     TUMOR  . PERIPHERAL VASCULAR CATHETERIZATION N/A 05/21/2015   Procedure: Endovascular Repair/Stent Graft;  Surgeon: [name], MD;  Location: [place] CV LAB;  Service: Cardiovascular;  Laterality: N/A;  . PERIPHERAL VASCULAR CATHETERIZATION  06/20/2015   Procedure: Lower Extremity Angiography;  Surgeon: [name], MD;  Location: [place] CV LAB;  Service: Cardiovascular;;  . PERIPHERAL VASCULAR CATHETERIZATION  06/20/2015   Procedure: Lower Extremity Intervention;  Surgeon: [name], MD;  Location: [place] CV LAB;  Service: Cardiovascular;;    There were no vitals filed for this visit.  Subjective Assessment - 10/14/18 1620    Subjective   Patient reports he is a little sore from exercises yesterday but willing to try again. States he cannot recall the exercises from yesterday specifically.     Pertinent History  Patient with recent diagnosis of Parkinson's disease last fall.  Patient's wife passed away during the same time frame and now patient lives alone and has no family nearby.     Patient Stated Goals  Patient reports he would like to improve his balance and walking  and remain independent as possible.     Currently in Pain?  No/denies    Pain Score  0-No pain    Multiple Pain Sites  No          Neuromuscular Reeducation:  Patient seen for initial instruction of LSVT BIG exercises: LSVT Daily Session Maximal Daily Exercises:  Sustained movements are designed to rescale the amplitude of movement output for generalization  to daily functional activities. Performed as follows for 1 set of 10 repetitions each: Multi directional  sustained movements- 1) Floor to ceiling, 2) Side to side. Multi directional Repetitive movements  performed in standing  and are designed to provide retraining effort needed for sustained muscle activation in tasks Performed as follows: 3) Step and reach forward, 4) Step and Reach Backwards,  5) Step and reach sideways, 6) Rock and reach forward/backward, 7) Rock and reach sideways.   Exercises performed in a standard version, patient continued to require minimal assist to complete, verbal cues and therapist modeling. In standing therapist provided min assist to CGA for exercises and use of gait belt for safety.   Functional mobility:  Ambulated 200 feet for 2 trials with short rest break, cues for amplitude of gait as well as cues and modeling for reciprocal arm swing.   Patient currently being transported down to the rehab clinic in wheelchair.   Response to tx:   Patient becoming more familiar with exercises over the last 2 sessions, he continues to require assist with performing in standing;  Patient continues to progress with exercise although He could not recall  the exercises yesterday he was familiar with  some of the instructions and techniques. Therapist will plan to issue written/pictorial instructions tomorrow after performing a couple days with therapist in order to be familiar with exercises and produce with proper form at home.                     OT Education - 10/14/18 1621    Education Details  LSVT BIG maximal daily exercises in standard version    Person(s) Educated  Patient    Methods  Explanation;Demonstration;Verbal cues    Comprehension  Verbalized understanding;Verbal cues required;Returned demonstration;Need further instruction          OT Long Term Goals - 10/14/18 1034      OT LONG TERM GOAL #1   Title  Patient will improve gait speed and endurance and be able to walk 700 feet in 6 minutes to negotiate around the home and community safely in 4 weeks     Baseline  6 min walk test eval 560 feet    Time  4    Period  Weeks    Status  New    Target Date   11/10/18      OT LONG TERM GOAL #2   Title  Patient will complete HEP for maximal daily exercises with modified independence in 4 weeks       Baseline  no current program     Time  4    Period  Weeks    Status  New    Target Date  11/10/18      OT LONG TERM GOAL #3   Title  Patient will transfer from sit to stand without the use of arms safely and independently from a variety of chairs/surfaces in 4 weeks.    Baseline  demonstrates difficulty with sit to stand from low surfaces.     Time  4  Period  Weeks    Status  New    Target Date  11/10/18      OT LONG TERM GOAL #4   Title  Patient will demonstrate reciprocal arm swing during functional mobility without cues.     Baseline  reciprocal arm swing absent on eval.     Time  4    Period  Weeks    Status  New    Target Date  11/10/18            Plan - 10/14/18 1622    Clinical Impression Statement  Patient continues to progress with exercise although He could not recall  the exercises yesterday he was familiar with some of the instructions and techniques. Therapist will plan to issue written/pictorial instructions tomorrow after performing a couple days with therapist in order to be familiar with exercises and produce with proper form at home.   Continue with intensive LSVT BIG program to impact limitations from Parkinson's dsease and facilitate greatest level of independence.     OT Occupational Profile and History  Detailed Assessment- Review of Records and additional review of physical, cognitive, psychosocial history related to current functional performance    Occupational performance deficits (Please refer to evaluation for details):  ADL's;IADL's;Leisure;Social Participation    Body Structure / Function / Physical Skills  ADL;Dexterity;Flexibility;Strength;Balance;Coordination;FMC;IADL;Endurance;Gait;UE functional use;Mobility    Cognitive Skills  Memory    Psychosocial Skills  Routines and Behaviors;Coping Strategies     Rehab Potential  Good    Clinical Decision Making  Several treatment options, min-mod task modification necessary    Comorbidities Affecting Occupational Performance:  May have comorbidities impacting occupational performance    Modification or Assistance to Complete Evaluation   Min-Moderate modification of tasks or assist with assess necessary to complete eval    OT Frequency  4x / week    OT Duration  4 weeks    OT Treatment/Interventions  Self-care/ADL training;Therapeutic exercise;DME and/or AE instruction;Functional Mobility Training;Cognitive remediation/compensation;Balance training;Neuromuscular education;Gait Optician, dispensing;Therapeutic activities;Patient/family education;Coping strategies training    Consulted and Agree with Plan of Care  Patient       Patient will benefit from skilled therapeutic intervention in order to improve the following deficits and impairments:  Body Structure / Function / Physical Skills, Psychosocial Skills, Cognitive Skills  Visit Diagnosis: Difficulty in walking, not elsewhere classified  Muscle weakness (generalized)  Other lack of coordination  Unsteadiness on feet    Problem List Patient Active Problem List   Diagnosis Date Noted  . Shoulder pain 08/11/2017  . Urinary urgency 08/11/2017  . Venous insufficiency of both lower extremities 10/25/2016  . Bilateral leg edema 10/19/2016  . Prostate cancer ([place]) 09/27/2016  . Essential hypertension 10/29/2015  . Hyperlipidemia 10/29/2015  . Esophageal reflux 10/29/2015  . Centrilobular emphysema ([place]) 10/29/2015  . Labyrinthitis 10/29/2015  . Microhematuria 09/29/2015  . Femoral artery aneurysm, left ([place]) 06/22/2015  . Cerebral vascular disease 06/21/2015  . AAA (abdominal aortic aneurysm) ([place]) 05/21/2015  . Degenerative disc disease, lumbar 04/24/2015  . Urinary incontinence, male, stress 04/24/2015  . Dizziness 01/20/2015  . Endocarditis 01/20/2015  . Aneurysm  of abdominal vessel ([place]) 08/30/2014  . Chronic obstructive pulmonary disease ([place]) 08/30/2014  . Personal history of transient ischemic attack (TIA), and cerebral infarction without residual deficits 08/30/2014   Brynleigh Sequeira T Shalia Bartko, OTR/L, CLT  Bryam Taborda 10/14/2018, 4:25 PM  [place] MAIN [facility] SERVICES [address] [place], [place], [address] Phone: [phone]  Fax:  [phone]  Name: SAFAL HALDERMAN MRN: [id number] Date of Birth: 30-Apr-1946

## 2018-10-14 NOTE — Therapy (Signed)
[place] MAIN [facility] SERVICES [address] [place], [place], [address] Phone: [phone]   Fax:  [phone]  Occupational Therapy Treatment  Patient Details  Name: Howard Anderson MRN: [id number] Date of Birth: [birth year] Referring Provider (OT): Jennings Books   Encounter Date: 10/09/2018  OT End of Session - 10/14/18 1610    Visit Number  2    Number of Visits  17    Date for OT Re-Evaluation  11/10/18    OT Start Time  1255    OT Stop Time  1400    OT Time Calculation (min)  65 min    Activity Tolerance  Patient tolerated treatment well    Behavior During Therapy  [facility] for tasks assessed/performed       Past Medical History:  Diagnosis Date  . AAA (abdominal aortic aneurysm) without rupture ([place])   . AAA (abdominal aortic aneurysm) without rupture ([place])   . Cancer [facility])    prostate cancer  . Chest pain   . COPD (chronic obstructive pulmonary disease) ([place])   . Depression   . Dizziness and giddiness   . Dvt femoral (deep venous thrombosis) ([facility])    H/O LEG  . GERD (gastroesophageal reflux disease)   . Heart disease   . Hyperlipidemia   . Hypertension   . Parkinson disease ([place])   . Prostate cancer ([place])   . Shortness of breath dyspnea    DOE  . Shoulder fracture, right   . Stroke [facility])    tia    Past Surgical History:  Procedure Laterality Date  . CATARACT EXTRACTION W/PHACO Left 09/27/2018   Procedure: CATARACT EXTRACTION PHACO AND INTRAOCULAR LENS PLACEMENT ([place]) LEFT;  Surgeon: Leandrew Koyanagi, MD;  Location: [facility] ORS;  Service: Ophthalmology;  Laterality: Left;  [place]  00:42 AP% 15.1 CDE 5.16 Fluid pack lot # [id number]  . COLONOSCOPY  2010  . EMBOLECTOMY Left 06/20/2015   Procedure: EMBOLECTOMY;  Surgeon: [name], MD;  Location: [facility] ORS;  Service: Vascular;  Laterality: Left;  brachial thrombectomy  . ENDARTERECTOMY FEMORAL Left 06/20/2015   Procedure: ENDARTERECTOMY FEMORAL/FEMORAL PSEUDOANEURYSM  repair;  Surgeon: [name], MD;  Location: [facility] ORS;  Service: Vascular;  Laterality: Left;  femerol  . FRACTURE SURGERY Right    SHOULDER  . INNER EAR SURGERY    . MANDIBLE SURGERY     TUMOR  . PERIPHERAL VASCULAR CATHETERIZATION N/A 05/21/2015   Procedure: Endovascular Repair/Stent Graft;  Surgeon: [name], MD;  Location: [place] CV LAB;  Service: Cardiovascular;  Laterality: N/A;  . PERIPHERAL VASCULAR CATHETERIZATION  06/20/2015   Procedure: Lower Extremity Angiography;  Surgeon: [name], MD;  Location: [place] CV LAB;  Service: Cardiovascular;;  . PERIPHERAL VASCULAR CATHETERIZATION  06/20/2015   Procedure: Lower Extremity Intervention;  Surgeon: [name], MD;  Location: [place] CV LAB;  Service: Cardiovascular;;    There were no vitals filed for this visit.  Subjective Assessment - 10/14/18 1608    Subjective   Patient reports he is ready to get started with his therapy.     Pertinent History  Patient with recent diagnosis of Parkinson's disease last fall.  Patient's wife passed away during the same time frame and now patient lives alone and has no family nearby.     Currently in Pain?  No/denies    Pain Score  0-No pain    Multiple Pain Sites  No  neuromuscular Reeducation:  Patient seen for initial instruction of LSVT BIG exercises: LSVT Daily Session Maximal Daily Exercises:  Sustained movements are designed to rescale the amplitude of movement output for generalization  to daily functional activities. Performed as follows for 1 set of 10 repetitions each: Multi directional  sustained movements- 1) Floor to ceiling, 2) Side to side. Multi directional Repetitive movements  performed in standing and are designed to provide retraining effort needed for sustained muscle  activation in tasks Performed as follows: 3) Step and reach forward, 4) Step and Reach Backwards,  5) Step and reach sideways, 6) Rock and reach  forward/backward, 7) Rock and reach sideways.   Exercises performed in a standard version, patient required minimal assist to complete, verbal cues and therapist modeling. In standing therapist provided min assist to CGA for exercises and use of gait belt for safety.   Functional mobility:  Ambulated 150 feet for 2 trials with short rest break, cues for amplitude of gait as well as cues and modeling for reciprocal arm swing.   Response to tx:   Patient seen for initial instruction of LSVT BIG maximal daily exercises. Patient was able to perform in the standard version this date with min assist for balance from therapist. Therapist providing modeling of exercises along with verbal and tactile cues as needed for proper form and technique. Continue to work towards goals in plan of care to maximize safety and independence with balance functional mobility, self care and IADL tasks.                     OT Education - 10/14/18 1610    Education Details  LSVT BIG maximal daily exercises in standard version    Person(s) Educated  Patient    Methods  Explanation;Demonstration;Verbal cues    Comprehension  Verbalized understanding;Verbal cues required;Returned demonstration;Need further instruction          OT Long Term Goals - 10/14/18 1034      OT LONG TERM GOAL #1   Title  Patient will improve gait speed and endurance and be able to walk 700 feet in 6 minutes to negotiate around the home and community safely in 4 weeks     Baseline  6 min walk test eval 560 feet    Time  4    Period  Weeks    Status  New    Target Date  11/10/18      OT LONG TERM GOAL #2   Title  Patient will complete HEP for maximal daily exercises with modified independence in 4 weeks       Baseline  no current program     Time  4    Period  Weeks    Status  New    Target Date  11/10/18      OT LONG TERM GOAL #3   Title  Patient will transfer from sit to stand without the use of arms safely and  independently from a variety of chairs/surfaces in 4 weeks.    Baseline  demonstrates difficulty with sit to stand from low surfaces.     Time  4    Period  Weeks    Status  New    Target Date  11/10/18      OT LONG TERM GOAL #4   Title  Patient will demonstrate reciprocal arm swing during functional mobility without cues.     Baseline  reciprocal arm swing absent on eval.  Time  4    Period  Weeks    Status  New    Target Date  11/10/18            Plan - 10/14/18 1612    Clinical Impression Statement  Patient seen for initial instruction of LSVT BIG maximal daily exercises. Patient was able to perform in the standard version this date with min assist for balance from therapist. Therapist providing modeling of exercises along with verbal and tactile cues as needed for proper form and technique. Continue to work towards goals in plan of care to maximize safety and independence with balance functional mobility, self care and IADL tasks.     OT Occupational Profile and History  Detailed Assessment- Review of Records and additional review of physical, cognitive, psychosocial history related to current functional performance    Occupational performance deficits (Please refer to evaluation for details):  ADL's;IADL's;Leisure;Social Participation    Body Structure / Function / Physical Skills  ADL;Dexterity;Flexibility;Strength;Balance;Coordination;FMC;IADL;Endurance;Gait;UE functional use;Mobility    Cognitive Skills  Memory    Psychosocial Skills  Routines and Behaviors;Coping Strategies    Rehab Potential  Good    Clinical Decision Making  Several treatment options, min-mod task modification necessary    Comorbidities Affecting Occupational Performance:  May have comorbidities impacting occupational performance    Modification or Assistance to Complete Evaluation   Min-Moderate modification of tasks or assist with assess necessary to complete eval    OT Frequency  4x / week    OT  Duration  4 weeks    OT Treatment/Interventions  Self-care/ADL training;Therapeutic exercise;DME and/or AE instruction;Functional Mobility Training;Cognitive remediation/compensation;Balance training;Neuromuscular education;Gait Optician, dispensing;Therapeutic activities;Patient/family education;Coping strategies training    Consulted and Agree with Plan of Care  Patient       Patient will benefit from skilled therapeutic intervention in order to improve the following deficits and impairments:  Body Structure / Function / Physical Skills, Psychosocial Skills, Cognitive Skills  Visit Diagnosis: Unsteadiness on feet  Difficulty in walking, not elsewhere classified  Muscle weakness (generalized)  Other lack of coordination    Problem List Patient Active Problem List   Diagnosis Date Noted  . Shoulder pain 08/11/2017  . Urinary urgency 08/11/2017  . Venous insufficiency of both lower extremities 10/25/2016  . Bilateral leg edema 10/19/2016  . Prostate cancer ([place]) 09/27/2016  . Essential hypertension 10/29/2015  . Hyperlipidemia 10/29/2015  . Esophageal reflux 10/29/2015  . Centrilobular emphysema ([place]) 10/29/2015  . Labyrinthitis 10/29/2015  . Microhematuria 09/29/2015  . Femoral artery aneurysm, left ([place]) 06/22/2015  . Cerebral vascular disease 06/21/2015  . AAA (abdominal aortic aneurysm) ([place]) 05/21/2015  . Degenerative disc disease, lumbar 04/24/2015  . Urinary incontinence, male, stress 04/24/2015  . Dizziness 01/20/2015  . Endocarditis 01/20/2015  . Aneurysm of abdominal vessel ([place]) 08/30/2014  . Chronic obstructive pulmonary disease ([place]) 08/30/2014  . Personal history of transient ischemic attack (TIA), and cerebral infarction without residual deficits 08/30/2014   Amy T Lovett, OTR/L, CLT  Lovett,Amy 10/14/2018, 4:14 PM  [place] MAIN [facility] SERVICES [address] [place], [place], [address] Phone:  [phone]   Fax:  [phone]  Name: Howard Anderson MRN: [id number] Date of Birth: Jul 10, 1946

## 2018-10-14 NOTE — Therapy (Signed)
[place] MAIN [facility] SERVICES [address] [place], [place], [address] Phone: [phone]   Fax:  [phone]  Occupational Therapy Treatment  Patient Details  Name: Howard Anderson MRN: [id number] Date of Birth: 1946/01/16 Referring Provider (OT): Jennings Books   Encounter Date: 10/12/2018  OT End of Session - 10/14/18 1743    Visit Number  5    Number of Visits  17    Date for OT Re-Evaluation  11/10/18    OT Start Time  7530    OT Stop Time  1410    OT Time Calculation (min)  65 min    Activity Tolerance  Patient tolerated treatment well    Behavior During Therapy  [facility] for tasks assessed/performed       Past Medical History:  Diagnosis Date  . AAA (abdominal aortic aneurysm) without rupture ([place])   . AAA (abdominal aortic aneurysm) without rupture ([place])   . Cancer [facility])    prostate cancer  . Chest pain   . COPD (chronic obstructive pulmonary disease) ([place])   . Depression   . Dizziness and giddiness   . Dvt femoral (deep venous thrombosis) ([facility])    H/O LEG  . GERD (gastroesophageal reflux disease)   . Heart disease   . Hyperlipidemia   . Hypertension   . Parkinson disease ([place])   . Prostate cancer ([place])   . Shortness of breath dyspnea    DOE  . Shoulder fracture, right   . Stroke [facility])    tia    Past Surgical History:  Procedure Laterality Date  . CATARACT EXTRACTION W/PHACO Left 09/27/2018   Procedure: CATARACT EXTRACTION PHACO AND INTRAOCULAR LENS PLACEMENT ([place]) LEFT;  Surgeon: Leandrew Koyanagi, MD;  Location: [facility] ORS;  Service: Ophthalmology;  Laterality: Left;  [place]  00:42 AP% 15.1 CDE 5.16 Fluid pack lot # [id number]  . COLONOSCOPY  2010  . EMBOLECTOMY Left 06/20/2015   Procedure: EMBOLECTOMY;  Surgeon: [name], MD;  Location: [facility] ORS;  Service: Vascular;  Laterality: Left;  brachial thrombectomy  . ENDARTERECTOMY FEMORAL Left 06/20/2015   Procedure: ENDARTERECTOMY FEMORAL/FEMORAL PSEUDOANEURYSM  repair;  Surgeon: [name], MD;  Location: [facility] ORS;  Service: Vascular;  Laterality: Left;  femerol  . FRACTURE SURGERY Right    SHOULDER  . INNER EAR SURGERY    . MANDIBLE SURGERY     TUMOR  . PERIPHERAL VASCULAR CATHETERIZATION N/A 05/21/2015   Procedure: Endovascular Repair/Stent Graft;  Surgeon: [name], MD;  Location: [place] CV LAB;  Service: Cardiovascular;  Laterality: N/A;  . PERIPHERAL VASCULAR CATHETERIZATION  06/20/2015   Procedure: Lower Extremity Angiography;  Surgeon: [name], MD;  Location: [place] CV LAB;  Service: Cardiovascular;;  . PERIPHERAL VASCULAR CATHETERIZATION  06/20/2015   Procedure: Lower Extremity Intervention;  Surgeon: [name], MD;  Location: [place] CV LAB;  Service: Cardiovascular;;    There were no vitals filed for this visit.  Subjective Assessment - 10/14/18 1742    Subjective   Patient reports he did his exercises last night at home, followed the papers but reports it took a long time to complete having to look back and forth.     Pertinent History  Patient with recent diagnosis of Parkinson's disease last fall.  Patient's wife passed away during the same time frame and now patient lives alone and has no family nearby.     Patient Stated Goals  Patient reports he would like to improve  his balance and walking and remain independent as possible.     Currently in Pain?  No/denies    Pain Score  0-No pain        Neuromuscular Reeducation:    Patient seen for instruction of LSVT BIG exercises: LSVT Daily Session Maximal Daily Exercises:  Sustained movements are designed to rescale the amplitude of movement output for generalization  to daily functional activities. Performed as follows for 1 set of 10 repetitions each: Multi directional  sustained movements- 1) Floor to ceiling, 2) Side to side. Multi directional Repetitive movements  performed in standing and are designed to provide retraining  effort needed for sustained muscle  activation in tasks Performed as follows: 3) Step and reach forward, 4) Step and Reach Backwards,  5) Step and reach sideways, 6) Rock and reach forward/backward, 7) Rock and reach sideways.   Exercises performed in a standard version with cues for form and technique and CGA with exercises in standing for safety with balance.  Focused on increasing size of arm and hand movements with exercises this date.  Cues for amplitude of step size when performing directional stepping and with functional mobility skills.   Discussed functional component tasks and the need to establish 5 with one being sit to stand.  Offered suggestions, ideas and activity analysis to produce list for patient.  He would like to think about it over the weekend and formulate his list.    Functional mobility for 300 feet this date for 2 trials, short rest break as needed, cues for reciprocal arm movements and amplitude of steps.  Patient able to respond to cues but quickly resorts to old pattern when distracted.   Response to tx:   Patient has progressed well this week with progression of maximal daily exercises, he is requiring less cues, showing signs of recalling exercises in sequence, continues to require cues for technique and will continue to grade exercises increasing in complexity over the next sessions.  Continue to work towards goals in [place], finalize list of functional component tasks next session.                  OT Education - 10/14/18 1743    Education Details  LSVT BIG maximal daily exercises in standard version, functional component tasks    Person(s) Educated  Patient    Methods  Explanation;Demonstration;Verbal cues;Handout    Comprehension  Verbalized understanding;Verbal cues required;Returned demonstration;Need further instruction          OT Long Term Goals - 10/14/18 1034      OT LONG TERM GOAL #1   Title  Patient will improve gait speed and  endurance and be able to walk 700 feet in 6 minutes to negotiate around the home and community safely in 4 weeks     Baseline  6 min walk test eval 560 feet    Time  4    Period  Weeks    Status  New    Target Date  11/10/18      OT LONG TERM GOAL #2   Title  Patient will complete HEP for maximal daily exercises with modified independence in 4 weeks       Baseline  no current program     Time  4    Period  Weeks    Status  New    Target Date  11/10/18      OT LONG TERM GOAL #3   Title  Patient will transfer from sit  to stand without the use of arms safely and independently from a variety of chairs/surfaces in 4 weeks.    Baseline  demonstrates difficulty with sit to stand from low surfaces.     Time  4    Period  Weeks    Status  New    Target Date  11/10/18      OT LONG TERM GOAL #4   Title  Patient will demonstrate reciprocal arm swing during functional mobility without cues.     Baseline  reciprocal arm swing absent on eval.     Time  4    Period  Weeks    Status  New    Target Date  11/10/18            Plan - 10/14/18 1744    Clinical Impression Statement  Patient has progressed well this week with progression of maximal daily exercises, he is requiring less cues, showing signs of recalling exercises in sequence, continues to require cues for technique and will continue to grade exercises increasing in complexity over the next sessions.  Continue to work towards goals in [place], finalize list of functional component tasks next session.      OT Occupational Profile and History  Detailed Assessment- Review of Records and additional review of physical, cognitive, psychosocial history related to current functional performance    Occupational performance deficits (Please refer to evaluation for details):  ADL's;IADL's;Leisure;Social Participation    Body Structure / Function / Physical Skills  ADL;Dexterity;Flexibility;Strength;Balance;Coordination;FMC;IADL;Endurance;Gait;UE  functional use;Mobility    Cognitive Skills  Memory    Psychosocial Skills  Routines and Behaviors;Coping Strategies    Rehab Potential  Good    Clinical Decision Making  Several treatment options, min-mod task modification necessary    Comorbidities Affecting Occupational Performance:  May have comorbidities impacting occupational performance    Modification or Assistance to Complete Evaluation   Min-Moderate modification of tasks or assist with assess necessary to complete eval    OT Frequency  4x / week    OT Duration  4 weeks    OT Treatment/Interventions  Self-care/ADL training;Therapeutic exercise;DME and/or AE instruction;Functional Mobility Training;Cognitive remediation/compensation;Balance training;Neuromuscular education;Gait Optician, dispensing;Therapeutic activities;Patient/family education;Coping strategies training    Consulted and Agree with Plan of Care  Patient       Patient will benefit from skilled therapeutic intervention in order to improve the following deficits and impairments:  Body Structure / Function / Physical Skills, Psychosocial Skills, Cognitive Skills  Visit Diagnosis: Difficulty in walking, not elsewhere classified  Muscle weakness (generalized)  Other lack of coordination  Unsteadiness on feet    Problem List Patient Active Problem List   Diagnosis Date Noted  . Shoulder pain 08/11/2017  . Urinary urgency 08/11/2017  . Venous insufficiency of both lower extremities 10/25/2016  . Bilateral leg edema 10/19/2016  . Prostate cancer ([place]) 09/27/2016  . Essential hypertension 10/29/2015  . Hyperlipidemia 10/29/2015  . Esophageal reflux 10/29/2015  . Centrilobular emphysema ([place]) 10/29/2015  . Labyrinthitis 10/29/2015  . Microhematuria 09/29/2015  . Femoral artery aneurysm, left ([place]) 06/22/2015  . Cerebral vascular disease 06/21/2015  . AAA (abdominal aortic aneurysm) ([place]) 05/21/2015  . Degenerative disc disease, lumbar  04/24/2015  . Urinary incontinence, male, stress 04/24/2015  . Dizziness 01/20/2015  . Endocarditis 01/20/2015  . Aneurysm of abdominal vessel ([place]) 08/30/2014  . Chronic obstructive pulmonary disease ([place]) 08/30/2014  . Personal history of transient ischemic attack (TIA), and cerebral infarction without residual deficits 08/30/2014   Howard Anderson T Tomasita Morrow,  OTR/L, CLT  Sheriff Rodenberg 10/14/2018, 5:56 PM  [place] MAIN [facility] SERVICES [address] [place], [place], [address] Phone: [phone]   Fax:  [phone]  Name: Howard Anderson MRN: [id number] Date of Birth: [birth year]

## 2018-10-16 ENCOUNTER — Other Ambulatory Visit: Payer: Self-pay

## 2018-10-16 ENCOUNTER — Ambulatory Visit: Payer: Medicare Other | Admitting: Occupational Therapy

## 2018-10-16 DIAGNOSIS — R2681 Unsteadiness on feet: Secondary | ICD-10-CM | POA: Diagnosis not present

## 2018-10-16 DIAGNOSIS — R278 Other lack of coordination: Secondary | ICD-10-CM

## 2018-10-16 DIAGNOSIS — M6281 Muscle weakness (generalized): Secondary | ICD-10-CM | POA: Diagnosis not present

## 2018-10-16 DIAGNOSIS — R262 Difficulty in walking, not elsewhere classified: Secondary | ICD-10-CM

## 2018-10-17 ENCOUNTER — Ambulatory Visit: Payer: Medicare Other | Admitting: Occupational Therapy

## 2018-10-17 DIAGNOSIS — M6281 Muscle weakness (generalized): Secondary | ICD-10-CM

## 2018-10-17 DIAGNOSIS — R2681 Unsteadiness on feet: Secondary | ICD-10-CM

## 2018-10-17 DIAGNOSIS — R278 Other lack of coordination: Secondary | ICD-10-CM

## 2018-10-17 DIAGNOSIS — R262 Difficulty in walking, not elsewhere classified: Secondary | ICD-10-CM

## 2018-10-18 ENCOUNTER — Other Ambulatory Visit: Payer: Self-pay

## 2018-10-18 ENCOUNTER — Ambulatory Visit: Payer: Medicare Other | Admitting: Occupational Therapy

## 2018-10-18 DIAGNOSIS — R2681 Unsteadiness on feet: Secondary | ICD-10-CM

## 2018-10-18 DIAGNOSIS — R262 Difficulty in walking, not elsewhere classified: Secondary | ICD-10-CM | POA: Diagnosis not present

## 2018-10-18 DIAGNOSIS — R278 Other lack of coordination: Secondary | ICD-10-CM | POA: Diagnosis not present

## 2018-10-18 DIAGNOSIS — M6281 Muscle weakness (generalized): Secondary | ICD-10-CM | POA: Diagnosis not present

## 2018-10-19 ENCOUNTER — Ambulatory Visit: Payer: Medicare Other | Admitting: Occupational Therapy

## 2018-10-19 DIAGNOSIS — R2681 Unsteadiness on feet: Secondary | ICD-10-CM | POA: Diagnosis not present

## 2018-10-19 DIAGNOSIS — R262 Difficulty in walking, not elsewhere classified: Secondary | ICD-10-CM

## 2018-10-19 DIAGNOSIS — M6281 Muscle weakness (generalized): Secondary | ICD-10-CM | POA: Diagnosis not present

## 2018-10-19 DIAGNOSIS — R278 Other lack of coordination: Secondary | ICD-10-CM | POA: Diagnosis not present

## 2018-10-23 ENCOUNTER — Ambulatory Visit: Payer: Medicare Other | Admitting: Occupational Therapy

## 2018-10-24 ENCOUNTER — Ambulatory Visit: Payer: Medicare Other | Admitting: Occupational Therapy

## 2018-10-25 ENCOUNTER — Ambulatory Visit: Payer: Medicare Other | Admitting: Occupational Therapy

## 2018-10-26 ENCOUNTER — Ambulatory Visit: Payer: Medicare Other | Admitting: Occupational Therapy

## 2018-10-27 ENCOUNTER — Encounter: Payer: Self-pay | Admitting: Occupational Therapy

## 2018-10-27 NOTE — Therapy (Signed)
[place] MAIN [facility] SERVICES [address] [place], [place], [address] Phone: [phone]   Fax:  [phone]  Occupational Therapy Treatment  Patient Details  Name: Howard Anderson MRN: [id number] Date of Birth: [birth year] Referring Provider (OT): Jennings Books   Encounter Date: 10/19/2018  OT End of Session - 10/27/18 1454    Visit Number  9    Number of Visits  17    Date for OT Re-Evaluation  11/10/18    OT Start Time  1301    OT Stop Time  1400    OT Time Calculation (min)  59 min    Activity Tolerance  Patient tolerated treatment well    Behavior During Therapy  [facility] for tasks assessed/performed       Past Medical History:  Diagnosis Date  . AAA (abdominal aortic aneurysm) without rupture ([place])   . AAA (abdominal aortic aneurysm) without rupture ([place])   . Cancer [facility])    prostate cancer  . Chest pain   . COPD (chronic obstructive pulmonary disease) ([place])   . Depression   . Dizziness and giddiness   . Dvt femoral (deep venous thrombosis) ([facility])    H/O LEG  . GERD (gastroesophageal reflux disease)   . Heart disease   . Hyperlipidemia   . Hypertension   . Parkinson disease ([place])   . Prostate cancer ([place])   . Shortness of breath dyspnea    DOE  . Shoulder fracture, right   . Stroke [facility])    tia    Past Surgical History:  Procedure Laterality Date  . CATARACT EXTRACTION W/PHACO Left 09/27/2018   Procedure: CATARACT EXTRACTION PHACO AND INTRAOCULAR LENS PLACEMENT ([place]) LEFT;  Surgeon: Leandrew Koyanagi, MD;  Location: [facility] ORS;  Service: Ophthalmology;  Laterality: Left;  [place]  00:42 AP% 15.1 CDE 5.16 Fluid pack lot # [id number]  . COLONOSCOPY  2010  . EMBOLECTOMY Left 06/20/2015   Procedure: EMBOLECTOMY;  Surgeon: [name], MD;  Location: [facility] ORS;  Service: Vascular;  Laterality: Left;  brachial thrombectomy  . ENDARTERECTOMY FEMORAL Left 06/20/2015   Procedure: ENDARTERECTOMY FEMORAL/FEMORAL PSEUDOANEURYSM  repair;  Surgeon: [name], MD;  Location: [facility] ORS;  Service: Vascular;  Laterality: Left;  femerol  . FRACTURE SURGERY Right    SHOULDER  . INNER EAR SURGERY    . MANDIBLE SURGERY     TUMOR  . PERIPHERAL VASCULAR CATHETERIZATION N/A 05/21/2015   Procedure: Endovascular Repair/Stent Graft;  Surgeon: [name], MD;  Location: [place] CV LAB;  Service: Cardiovascular;  Laterality: N/A;  . PERIPHERAL VASCULAR CATHETERIZATION  06/20/2015   Procedure: Lower Extremity Angiography;  Surgeon: [name], MD;  Location: [place] CV LAB;  Service: Cardiovascular;;  . PERIPHERAL VASCULAR CATHETERIZATION  06/20/2015   Procedure: Lower Extremity Intervention;  Surgeon: [name], MD;  Location: [place] CV LAB;  Service: Cardiovascular;;    There were no vitals filed for this visit.  Subjective Assessment - 10/27/18 1453    Subjective   Patient reports he is becoming more comfortable with his daily exercises and has been focusing on going up and down the stairs how we have practiced in the clinic.      Pertinent History  Patient with recent diagnosis of Parkinson's disease last fall.  Patient's wife passed away during the same time frame and now patient lives alone and has no family nearby.     Patient Stated Goals  Patient reports he would like  to improve his balance and walking and remain independent as possible.     Currently in Pain?  No/denies    Pain Score  0-No pain    Multiple Pain Sites  No       Neuromuscular Reeducation:  Patient seen for instruction of LSVT BIG exercises: LSVT Daily Session Maximal Daily Exercises:  Sustained movements are designed to rescale the amplitude of movement output for generalization  to daily functional activities. Performed as follows for 1 set of 10 repetitions each: Multi directional  sustained movements- 1) Floor to ceiling, 2) Side to side. Multi directional Repetitive movements  performed in standing  and are designed to provide retraining effort needed for sustained muscle activation in tasks Performed as follows: 3) Step and reach forward, 4) Step and Reach Backwards,  5) Step and reach sideways, 6) Rock and reach forward/backward, 7) Rock and reach sideways.  Exercises performed in standard version with occasional cues for form and technique and occasional CGA with exercises in standing for safety with balance. Continued focused on increasing size of arm and hand movements with exercises, performed exercises with alternating sides to increase complexity of task.  Functional mobility for 365feet this date for 2 trials, one short rest break as needed, minimal cues for reciprocal arm movements and amplitude of steps. No assistive device required, cues at times for turns and he still requires a short rest break due to slight shortness of breath.   ADL:  Functional component list:  Sit to stand from a variety of surface heights without the use of arms, required cues for technique from lowest mat heightand regular chair Stepping on and off curb when there is nothing to hold onto, CGA to min assist with task, able to complete if he has something to hold onto.  Negotiating steps patient seen for going up and down 5 steps with use of hand rail on both sides, alternating feet and steps going up but tends to come down step by step.  Reaching to tie shoes difficulty with crossed leg method .         Response to tx: Patient continues to make good progress in all areas. He has become more familiar with the daily exercises and appears to be consistent with performing at home. He has worked this week on adding complexity to his exercises which challenges his balance. He has not felt comfortable performing the upgraded exercise at home alone but by the end of the week he felt comfortable completing these with the use of a chair at his side. Continue to work towards goals and plan of  care and focus on increasing consistency of daily performance with improved and increased complexity of tasks to further challenged patient.              OT Education - 10/27/18 1454    Education Details  HEP    Person(s) Educated  Patient    Methods  Explanation;Demonstration;Verbal cues;Handout    Comprehension  Verbalized understanding;Verbal cues required;Returned demonstration;Need further instruction          OT Long Term Goals - 10/27/18 1324      OT LONG TERM GOAL #1   Title  Patient will improve gait speed and endurance and be able to walk 700 feet in 6 minutes to negotiate around the home and community safely in 4 weeks     Baseline  6 min walk test eval 560 feet    Time  4    Period  Weeks    Status  On-going      OT LONG TERM GOAL #2   Title  Patient will complete HEP for maximal daily exercises with modified independence in 4 weeks       Baseline  no current program     Time  4    Period  Weeks    Status  On-going      OT LONG TERM GOAL #3   Title  Patient will transfer from sit to stand without the use of arms safely and independently from a variety of chairs/surfaces in 4 weeks.    Baseline  demonstrates difficulty with sit to stand from low surfaces.     Time  4    Period  Weeks    Status  On-going      OT LONG TERM GOAL #4   Title  Patient will demonstrate reciprocal arm swing during functional mobility without cues.     Baseline  reciprocal arm swing absent on eval.     Time  4    Period  Weeks    Status  On-going      OT LONG TERM GOAL #5   Status  On-going            Plan - 10/27/18 1454    Clinical Impression Statement  Patient continues to make good progress in all areas. He has become more familiar with the daily exercises and appears to be consistent with performing at home. He has worked this week on adding complexity to his exercises which challenges his balance. He has not felt comfortable performing the upgraded exercise at  home alone but by the end of the week he felt comfortable completing these with the use of a chair at his side. Continue to work towards goals and plan of care and focus on increasing consistency of daily performance with improved and increased complexity of tasks to further challenged patient.     OT Occupational Profile and History  Detailed Assessment- Review of Records and additional review of physical, cognitive, psychosocial history related to current functional performance    Occupational performance deficits (Please refer to evaluation for details):  ADL's;IADL's;Leisure;Social Participation    Body Structure / Function / Physical Skills  ADL;Dexterity;Flexibility;Strength;Balance;Coordination;FMC;IADL;Endurance;Gait;UE functional use;Mobility    Cognitive Skills  Memory    Psychosocial Skills  Routines and Behaviors;Coping Strategies    Rehab Potential  Good    Clinical Decision Making  Several treatment options, min-mod task modification necessary    Comorbidities Affecting Occupational Performance:  May have comorbidities impacting occupational performance    Modification or Assistance to Complete Evaluation   Min-Moderate modification of tasks or assist with assess necessary to complete eval    OT Frequency  4x / week    OT Duration  4 weeks    OT Treatment/Interventions  Self-care/ADL training;Therapeutic exercise;DME and/or AE instruction;Functional Mobility Training;Cognitive remediation/compensation;Balance training;Neuromuscular education;Gait Optician, dispensing;Therapeutic activities;Patient/family education;Coping strategies training    Consulted and Agree with Plan of Care  Patient       Patient will benefit from skilled therapeutic intervention in order to improve the following deficits and impairments:  Body Structure / Function / Physical Skills, Psychosocial Skills, Cognitive Skills  Visit Diagnosis: Difficulty in walking, not elsewhere classified  Muscle  weakness (generalized)  Other lack of coordination  Unsteadiness on feet    Problem List Patient Active Problem List   Diagnosis Date Noted  . Shoulder pain 08/11/2017  . Urinary urgency 08/11/2017  .  Venous insufficiency of both lower extremities 10/25/2016  . Bilateral leg edema 10/19/2016  . Prostate cancer ([place]) 09/27/2016  . Essential hypertension 10/29/2015  . Hyperlipidemia 10/29/2015  . Esophageal reflux 10/29/2015  . Centrilobular emphysema ([place]) 10/29/2015  . Labyrinthitis 10/29/2015  . Microhematuria 09/29/2015  . Femoral artery aneurysm, left ([place]) 06/22/2015  . Cerebral vascular disease 06/21/2015  . AAA (abdominal aortic aneurysm) ([place]) 05/21/2015  . Degenerative disc disease, lumbar 04/24/2015  . Urinary incontinence, male, stress 04/24/2015  . Dizziness 01/20/2015  . Endocarditis 01/20/2015  . Aneurysm of abdominal vessel ([place]) 08/30/2014  . Chronic obstructive pulmonary disease ([place]) 08/30/2014  . Personal history of transient ischemic attack (TIA), and cerebral infarction without residual deficits 08/30/2014   Howard Anderson, Howard Anderson, Howard Anderson  Howard Anderson 10/28/2018, 10:15 AM  [place] MAIN [facility] SERVICES [place], [place], [address] Phone: [phone]   Fax:  [phone]  Name: Howard Anderson MRN: [id number] Date of Birth: 1945-11-11

## 2018-10-27 NOTE — Therapy (Signed)
[place] MAIN [facility] SERVICES [address] [place], [place], [address] Phone: [phone]   Fax:  [phone]  Occupational Therapy Treatment  Patient Details  Name: Howard Anderson MRN: [id number] Date of Birth: 04-06-1946 Referring Provider (OT): Jennings Books   Encounter Date: 10/16/2018  OT End of Session - 10/27/18 0957    Visit Number  6    Number of Visits  17    Date for OT Re-Evaluation  11/10/18    OT Start Time  1300    OT Stop Time  1358    OT Time Calculation (min)  58 min    Activity Tolerance  Patient tolerated treatment well    Behavior During Therapy  [facility] for tasks assessed/performed       Past Medical History:  Diagnosis Date  . AAA (abdominal aortic aneurysm) without rupture ([place])   . AAA (abdominal aortic aneurysm) without rupture ([place])   . Cancer [facility])    prostate cancer  . Chest pain   . COPD (chronic obstructive pulmonary disease) ([place])   . Depression   . Dizziness and giddiness   . Dvt femoral (deep venous thrombosis) ([facility])    H/O LEG  . GERD (gastroesophageal reflux disease)   . Heart disease   . Hyperlipidemia   . Hypertension   . Parkinson disease ([place])   . Prostate cancer ([place])   . Shortness of breath dyspnea    DOE  . Shoulder fracture, right   . Stroke [facility])    tia    Past Surgical History:  Procedure Laterality Date  . CATARACT EXTRACTION W/PHACO Left 09/27/2018   Procedure: CATARACT EXTRACTION PHACO AND INTRAOCULAR LENS PLACEMENT ([place]) LEFT;  Surgeon: Leandrew Koyanagi, MD;  Location: [facility] ORS;  Service: Ophthalmology;  Laterality: Left;  [place]  00:42 AP% 15.1 CDE 5.16 Fluid pack lot # [id number]  . COLONOSCOPY  2010  . EMBOLECTOMY Left 06/20/2015   Procedure: EMBOLECTOMY;  Surgeon: [name], MD;  Location: [facility] ORS;  Service: Vascular;  Laterality: Left;  brachial thrombectomy  . ENDARTERECTOMY FEMORAL Left 06/20/2015   Procedure: ENDARTERECTOMY FEMORAL/FEMORAL PSEUDOANEURYSM  repair;  Surgeon: [name], MD;  Location: [facility] ORS;  Service: Vascular;  Laterality: Left;  femerol  . FRACTURE SURGERY Right    SHOULDER  . INNER EAR SURGERY    . MANDIBLE SURGERY     TUMOR  . PERIPHERAL VASCULAR CATHETERIZATION N/A 05/21/2015   Procedure: Endovascular Repair/Stent Graft;  Surgeon: [name], MD;  Location: [place] CV LAB;  Service: Cardiovascular;  Laterality: N/A;  . PERIPHERAL VASCULAR CATHETERIZATION  06/20/2015   Procedure: Lower Extremity Angiography;  Surgeon: [name], MD;  Location: [place] CV LAB;  Service: Cardiovascular;;  . PERIPHERAL VASCULAR CATHETERIZATION  06/20/2015   Procedure: Lower Extremity Intervention;  Surgeon: [name], MD;  Location: [place] CV LAB;  Service: Cardiovascular;;    There were no vitals filed for this visit.  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  Patient reports he has still worked towards performing his exercises as directed twice a day.     Pertinent History  Patient with recent diagnosis of Parkinson's disease last fall.  Patient's wife passed away during the same time frame and now patient lives alone and has no family nearby.     Patient Stated Goals  Patient reports he would like to improve his balance and walking and remain independent as possible.     Currently in Pain?  No/denies    Pain Score  0-No pain    Multiple Pain Sites  No         Neuromuscular Reeducation:    Patient seen for instruction of LSVT BIG exercises: LSVT Daily Session Maximal Daily Exercises:  Sustained movements are designed to rescale the amplitude of movement output for generalization  to daily functional activities. Performed as follows for 1 set of 10 repetitions each: Multi directional  sustained movements- 1) Floor to ceiling, 2) Side to side. Multi directional  Repetitive movements  performed in standing and are designed to provide retraining effort needed for sustained muscle activation in tasks Performed as follows: 3) Step and reach forward, 4) Step and Reach Backwards,  5) Step and reach sideways, 6) Rock and reach forward/backward, 7) Rock and reach sideways.   Exercises continued to be performed in a standard version with cues for form and technique and CGA with exercises in standing for safety with balance.  Continued focused on increasing size of arm and hand movements with exercises this date, performed exercises with alternating sides to increase complexity of task.  Continued cues for amplitude of step size when performing directional stepping and with functional mobility skills.   ADL:   Functional component list:   Sit to stand from a variety of surface heights without the use of arms, required cues for technique today from lowest mat height.  Stepping on and off curb when there is nothing to hold onto, CGA to min assist with task, able to complete if he has something to hold onto.  Negotiating steps patient seen for going up and down 5 steps with use of hand rail on both sides, alternating feet and steps going up but tends to come down step by step.   Reaching to tie shoes difficulty with crossed leg method.  .    Functional mobility for 275 feet this date for 2 trials, short rest break as needed, cues for reciprocal arm movements and amplitude of steps.    Response to tx:   Patient has progressed with performing exercises from a standard version, balance remains a challenge for exercises in standing and continues to require CGA for safety.  He was able to add in complexity of the task by performing exercises in an alternating method, right then left versus 10 on the right and 10 on the left, etc. He demonstrates some difficulty determining his functional component tasks but was able to determine 4 with therapist assist.  Continue to work  towards goals to increase independence in daily tasks, will continue to add challenges and increase complexity of exercises.              OT Education - 10/27/18 0957    Education Details  LSVT BIG maximal daily exercises in standard version, functional component tasks    Person(s) Educated  Patient    Methods  Explanation;Demonstration;Verbal cues;Handout    Comprehension  Verbalized understanding;Verbal cues required;Returned demonstration;Need further instruction          OT Long Term Goals - 10/14/18 1034      OT LONG TERM GOAL #1   Title  Patient will improve gait speed and endurance and be able to walk 700 feet in 6 minutes to negotiate around the home and community safely in 4 weeks     Baseline  6 min  walk test eval 560 feet    Time  4    Period  Weeks    Status  New    Target Date  11/10/18      OT LONG TERM GOAL #2   Title  Patient will complete HEP for maximal daily exercises with modified independence in 4 weeks       Baseline  no current program     Time  4    Period  Weeks    Status  New    Target Date  11/10/18      OT LONG TERM GOAL #3   Title  Patient will transfer from sit to stand without the use of arms safely and independently from a variety of chairs/surfaces in 4 weeks.    Baseline  demonstrates difficulty with sit to stand from low surfaces.     Time  4    Period  Weeks    Status  New    Target Date  11/10/18      OT LONG TERM GOAL #4   Title  Patient will demonstrate reciprocal arm swing during functional mobility without cues.     Baseline  reciprocal arm swing absent on eval.     Time  4    Period  Weeks    Status  New    Target Date  11/10/18            Plan - 10/27/18 0957    Clinical Impression Statement  Patient has progressed with performing exercises from a standard version, balance remains a challenge for exercises in standing and continues to require CGA for safety.  He was able to add in complexity of the task by  performing exercises in an alternating method, right then left versus 10 on the right and 10 on the left, etc. He demonstrates some difficulty determining his functional component tasks but was able to determine 4 with therapist assist.  Continue to work towards goals to increase independence in daily tasks, will continue to add challenges and increase complexity of exercises.      OT Occupational Profile and History  Detailed Assessment- Review of Records and additional review of physical, cognitive, psychosocial history related to current functional performance    Occupational performance deficits (Please refer to evaluation for details):  ADL's;IADL's;Leisure;Social Participation    Body Structure / Function / Physical Skills  ADL;Dexterity;Flexibility;Strength;Balance;Coordination;FMC;IADL;Endurance;Gait;UE functional use;Mobility    Cognitive Skills  Memory    Psychosocial Skills  Routines and Behaviors;Coping Strategies    Rehab Potential  Good    Clinical Decision Making  Several treatment options, min-mod task modification necessary    Comorbidities Affecting Occupational Performance:  May have comorbidities impacting occupational performance    Modification or Assistance to Complete Evaluation   Min-Moderate modification of tasks or assist with assess necessary to complete eval    OT Frequency  4x / week    OT Duration  4 weeks    OT Treatment/Interventions  Self-care/ADL training;Therapeutic exercise;DME and/or AE instruction;Functional Mobility Training;Cognitive remediation/compensation;Balance training;Neuromuscular education;Gait Optician, dispensing;Therapeutic activities;Patient/family education;Coping strategies training    Consulted and Agree with Plan of Care  Patient       Patient will benefit from skilled therapeutic intervention in order to improve the following deficits and impairments:  Body Structure / Function / Physical Skills, Psychosocial Skills, Cognitive  Skills  Visit Diagnosis: Difficulty in walking, not elsewhere classified  Muscle weakness (generalized)  Other lack of coordination  Unsteadiness on feet  Problem List Patient Active Problem List   Diagnosis Date Noted  . Shoulder pain 08/11/2017  . Urinary urgency 08/11/2017  . Venous insufficiency of both lower extremities 10/25/2016  . Bilateral leg edema 10/19/2016  . Prostate cancer ([place]) 09/27/2016  . Essential hypertension 10/29/2015  . Hyperlipidemia 10/29/2015  . Esophageal reflux 10/29/2015  . Centrilobular emphysema ([place]) 10/29/2015  . Labyrinthitis 10/29/2015  . Microhematuria 09/29/2015  . Femoral artery aneurysm, left ([place]) 06/22/2015  . Cerebral vascular disease 06/21/2015  . AAA (abdominal aortic aneurysm) ([place]) 05/21/2015  . Degenerative disc disease, lumbar 04/24/2015  . Urinary incontinence, male, stress 04/24/2015  . Dizziness 01/20/2015  . Endocarditis 01/20/2015  . Aneurysm of abdominal vessel ([place]) 08/30/2014  . Chronic obstructive pulmonary disease ([place]) 08/30/2014  . Personal history of transient ischemic attack (TIA), and cerebral infarction without residual deficits 08/30/2014   Amy T Lovett, OTR/L, CLT  Lovett,Amy 10/27/2018, 1:20 PM  [place] MAIN [facility] SERVICES [address] [place], [place], [address] Phone: [phone]   Fax:  [phone]  Name: Howard Anderson MRN: [id number] Date of Birth: 13-Jun-1946

## 2018-10-27 NOTE — Therapy (Signed)
[place] MAIN [facility] SERVICES [address] [place], [place], [address] Phone: [phone]   Fax:  [phone]  Occupational Therapy Treatment  Patient Details  Name: Howard Anderson MRN: [id number] Date of Birth: 05-27-1946 Referring Provider (OT): Jennings Books   Encounter Date: 10/17/2018  OT End of Session - 10/27/18 1322    Visit Number  7    Number of Visits  17    Date for OT Re-Evaluation  11/10/18    OT Start Time  1301    OT Stop Time  1400    OT Time Calculation (min)  59 min    Activity Tolerance  Patient tolerated treatment well    Behavior During Therapy  [facility] for tasks assessed/performed       Past Medical History:  Diagnosis Date  . AAA (abdominal aortic aneurysm) without rupture ([place])   . AAA (abdominal aortic aneurysm) without rupture ([place])   . Cancer [facility])    prostate cancer  . Chest pain   . COPD (chronic obstructive pulmonary disease) ([place])   . Depression   . Dizziness and giddiness   . Dvt femoral (deep venous thrombosis) (HCC)    H/O LEG  . GERD (gastroesophageal reflux disease)   . Heart disease   . Hyperlipidemia   . Hypertension   . Parkinson disease ([place])   . Prostate cancer ([place])   . Shortness of breath dyspnea    DOE  . Shoulder fracture, right   . Stroke [facility])    tia    Past Surgical History:  Procedure Laterality Date  . CATARACT EXTRACTION W/PHACO Left 09/27/2018   Procedure: CATARACT EXTRACTION PHACO AND INTRAOCULAR LENS PLACEMENT ([place]) LEFT;  Surgeon: Leandrew Koyanagi, MD;  Location: [facility] ORS;  Service: Ophthalmology;  Laterality: Left;  [place]  00:42 AP% 15.1 CDE 5.16 Fluid pack lot # [id number]  . COLONOSCOPY  2010  . EMBOLECTOMY Left 06/20/2015   Procedure: EMBOLECTOMY;  Surgeon: [name], MD;  Location: [facility] ORS;  Service: Vascular;  Laterality: Left;  brachial thrombectomy  . ENDARTERECTOMY FEMORAL Left 06/20/2015   Procedure: ENDARTERECTOMY FEMORAL/FEMORAL PSEUDOANEURYSM  repair;  Surgeon: [name], MD;  Location: [facility] ORS;  Service: Vascular;  Laterality: Left;  femerol  . FRACTURE SURGERY Right    SHOULDER  . INNER EAR SURGERY    . MANDIBLE SURGERY     TUMOR  . PERIPHERAL VASCULAR CATHETERIZATION N/A 05/21/2015   Procedure: Endovascular Repair/Stent Graft;  Surgeon: [name], MD;  Location: [place] CV LAB;  Service: Cardiovascular;  Laterality: N/A;  . PERIPHERAL VASCULAR CATHETERIZATION  06/20/2015   Procedure: Lower Extremity Angiography;  Surgeon: [name], MD;  Location: [place] CV LAB;  Service: Cardiovascular;;  . PERIPHERAL VASCULAR CATHETERIZATION  06/20/2015   Procedure: Lower Extremity Intervention;  Surgeon: [name], MD;  Location: [place] [facility] LAB;  Service: Cardiovascular;;    There were no vitals filed for this visit.  Subjective Assessment - 10/27/18 1321    Subjective   Patient reports he didn't feel comfortable with alternating sides last night at home and would like to practice it more this week.     Pertinent History  Patient with recent diagnosis of Parkinson's disease last fall.  Patient's wife passed away during the same time frame and now patient lives alone and has no family nearby.     Patient Stated Goals  Patient reports he would like to improve his balance and walking and remain  independent as possible.     Currently in Pain?  No/denies    Pain Score  0-No pain    Multiple Pain Sites  No           Neuromuscular Reeducation:  Patient seen for instruction of LSVT BIG exercises: LSVT Daily Session Maximal Daily Exercises:  Sustained movements are designed to rescale the amplitude of movement output for generalization  to daily functional activities. Performed as follows for 1 set of 10 repetitions each: Multi directional  sustained movements- 1) Floor to ceiling, 2) Side to side. Multi directional Repetitive movements  performed in standing and are designed to provide  retraining effort needed for sustained muscle activation in tasks Performed as follows: 3) Step and reach forward, 4) Step and Reach Backwards,  5) Step and reach sideways, 6) Rock and reach forward/backward, 7) Rock and reach sideways.  Exercises continued to be performed in a standard version with cues for form and technique and CGA with exercises in standing for safety with balance. Continued focused on increasing size of arm and hand movements with exercises this date, performed exercises with alternating sides to increase complexity of task. With alternating sides with exercises, patient does well with the first 2 but has more difficulty with balance with the exercises which require standing balance.  Continued cues for amplitude of step size when performing directional stepping and with functional mobility skills.  Functional mobility for 300 feet this date for 2 trials, short rest break as needed, cues for reciprocal arm movements and amplitude of steps. No assistive device required, cues at times for turns and he still requires a short rest break due to slight shortness of breath.   ADL:   Functional component list:   Sit to stand from a variety of surface heights without the use of arms, required cues for technique from lowest mat height and regular chair  Stepping on and off curb when there is nothing to hold onto, CGA to min assist with task, able to complete if he has something to hold onto.  Negotiating steps patient seen for going up and down 5 steps with use of hand rail on both sides, alternating feet and steps going up but tends to come down step by step.   Reaching to tie shoes difficulty with crossed leg method .   Functional mobility for 275 feet this date for 2 trials, short rest break as needed, cues for reciprocal arm movements and amplitude of steps.    Response to tx:    Patient is progressing well with performance of exercises in the clinic, he is familiar  with the exercises and beginning to know the sequence of the exercises and requires decreased cues for technique.  With added complexity of alternating sides, he does well with the sitting exercises but is challenged with the exercises in standing due to increased demands on balance.  Patient continues to require a short rest break with functional mobility and has some shortness of breath at times.  Patient tends to keep hands in a fisted position at times during exercises and benefits from cues and therapist demo to keep hands open and move in a larger movement pattern.  Continue to work towards goals in plan of care to improve mobility, balance, self care and safety with daily tasks.              OT Education - 10/27/18 1322    Education Details  LSVT BIG maximal daily exercises in standard version,  adding complexity to exercises, balance    Person(s) Educated  Patient    Methods  Explanation;Demonstration;Verbal cues;Handout    Comprehension  Verbalized understanding;Verbal cues required;Returned demonstration;Need further instruction          OT Long Term Goals - 10/27/18 1324      OT LONG TERM GOAL #1   Title  Patient will improve gait speed and endurance and be able to walk 700 feet in 6 minutes to negotiate around the home and community safely in 4 weeks     Baseline  6 min walk test eval 560 feet    Time  4    Period  Weeks    Status  On-going      OT LONG TERM GOAL #2   Title  Patient will complete HEP for maximal daily exercises with modified independence in 4 weeks       Baseline  no current program     Time  4    Period  Weeks    Status  On-going      OT LONG TERM GOAL #3   Title  Patient will transfer from sit to stand without the use of arms safely and independently from a variety of chairs/surfaces in 4 weeks.    Baseline  demonstrates difficulty with sit to stand from low surfaces.     Time  4    Period  Weeks    Status  On-going      OT LONG TERM GOAL  #4   Title  Patient will demonstrate reciprocal arm swing during functional mobility without cues.     Baseline  reciprocal arm swing absent on eval.     Time  4    Period  Weeks    Status  On-going      OT LONG TERM GOAL #5   Status  On-going            Plan - 10/27/18 1324    Clinical Impression Statement  Patient is progressing well with performance of exercises in the clinic, he is familiar with the exercises and beginning to know the sequence of the exercises and requires decreased cues for technique.  With added complexity of alternating sides, he does well with the sitting exercises but is challenged with the exercises in standing due to increased demands on balance.  Patient continues to require a short rest break with functional mobility and has some shortness of breath at times.  Patient tends to keep hands in a fisted position at times during exercises and benefits from cues and therapist demo to keep hands open and move in a larger movement pattern.  Continue to work towards goals in plan of care to improve mobility, balance, self care and safety with daily tasks.     OT Occupational Profile and History  Detailed Assessment- Review of Records and additional review of physical, cognitive, psychosocial history related to current functional performance    Occupational performance deficits (Please refer to evaluation for details):  ADL's;IADL's;Leisure;Social Participation    Body Structure / Function / Physical Skills  ADL;Dexterity;Flexibility;Strength;Balance;Coordination;FMC;IADL;Endurance;Gait;UE functional use;Mobility    Cognitive Skills  Memory    Psychosocial Skills  Routines and Behaviors;Coping Strategies    Rehab Potential  Good    Clinical Decision Making  Several treatment options, min-mod task modification necessary    Comorbidities Affecting Occupational Performance:  May have comorbidities impacting occupational performance    Modification or Assistance to Complete  Evaluation   Min-Moderate modification of tasks  or assist with assess necessary to complete eval    OT Frequency  4x / week    OT Duration  4 weeks    OT Treatment/Interventions  Self-care/ADL training;Therapeutic exercise;DME and/or AE instruction;Functional Mobility Training;Cognitive remediation/compensation;Balance training;Neuromuscular education;Gait Optician, dispensing;Therapeutic activities;Patient/family education;Coping strategies training    Consulted and Agree with Plan of Care  Patient       Patient will benefit from skilled therapeutic intervention in order to improve the following deficits and impairments:  Body Structure / Function / Physical Skills, Psychosocial Skills, Cognitive Skills  Visit Diagnosis: Difficulty in walking, not elsewhere classified  Muscle weakness (generalized)  Other lack of coordination  Unsteadiness on feet    Problem List Patient Active Problem List   Diagnosis Date Noted  . Shoulder pain 08/11/2017  . Urinary urgency 08/11/2017  . Venous insufficiency of both lower extremities 10/25/2016  . Bilateral leg edema 10/19/2016  . Prostate cancer ([place]) 09/27/2016  . Essential hypertension 10/29/2015  . Hyperlipidemia 10/29/2015  . Esophageal reflux 10/29/2015  . Centrilobular emphysema ([place]) 10/29/2015  . Labyrinthitis 10/29/2015  . Microhematuria 09/29/2015  . Femoral artery aneurysm, left ([place]) 06/22/2015  . Cerebral vascular disease 06/21/2015  . AAA (abdominal aortic aneurysm) ([place]) 05/21/2015  . Degenerative disc disease, lumbar 04/24/2015  . Urinary incontinence, male, stress 04/24/2015  . Dizziness 01/20/2015  . Endocarditis 01/20/2015  . Aneurysm of abdominal vessel ([place]) 08/30/2014  . Chronic obstructive pulmonary disease ([place]) 08/30/2014  . Personal history of transient ischemic attack (TIA), and cerebral infarction without residual deficits 08/30/2014    T , OTR/L, CLT  , 10/27/2018, 1:37  PM  [place] MAIN [facility] SERVICES [address] [place], [place], [address] Phone: [phone]   Fax:  [phone]  Name: Howard Anderson MRN: [id number] Date of Birth: [birth year]

## 2018-10-27 NOTE — Therapy (Signed)
[place] MAIN [facility] SERVICES [address] [place], [place], [address] Phone: [phone]   Fax:  [phone]  Occupational Therapy Treatment  Patient Details  Name: Howard Anderson MRN: [id number] Date of Birth: 1946/03/27 Referring Provider (OT): Jennings Books   Encounter Date: 10/18/2018  OT End of Session - 10/27/18 1411    Visit Number  8    Number of Visits  17    Date for OT Re-Evaluation  11/10/18    OT Start Time  1255    OT Stop Time  1359    OT Time Calculation (min)  64 min    Activity Tolerance  Patient tolerated treatment well    Behavior During Therapy  [facility] for tasks assessed/performed       Past Medical History:  Diagnosis Date  . AAA (abdominal aortic aneurysm) without rupture ([place])   . AAA (abdominal aortic aneurysm) without rupture ([place])   . Cancer [facility])    prostate cancer  . Chest pain   . COPD (chronic obstructive pulmonary disease) ([place])   . Depression   . Dizziness and giddiness   . Dvt femoral (deep venous thrombosis) (HCC)    H/O LEG  . GERD (gastroesophageal reflux disease)   . Heart disease   . Hyperlipidemia   . Hypertension   . Parkinson disease ([place])   . Prostate cancer ([place])   . Shortness of breath dyspnea    DOE  . Shoulder fracture, right   . Stroke [facility])    tia    Past Surgical History:  Procedure Laterality Date  . CATARACT EXTRACTION W/PHACO Left 09/27/2018   Procedure: CATARACT EXTRACTION PHACO AND INTRAOCULAR LENS PLACEMENT ([place]) LEFT;  Surgeon: Leandrew Koyanagi, MD;  Location: [facility] ORS;  Service: Ophthalmology;  Laterality: Left;  [place]  00:42 AP% 15.1 CDE 5.16 Fluid pack lot # [id number]  . COLONOSCOPY  2010  . EMBOLECTOMY Left 06/20/2015   Procedure: EMBOLECTOMY;  Surgeon: [name], MD;  Location: [facility] ORS;  Service: Vascular;  Laterality: Left;  brachial thrombectomy  . ENDARTERECTOMY FEMORAL Left 06/20/2015   Procedure: ENDARTERECTOMY FEMORAL/FEMORAL PSEUDOANEURYSM  repair;  Surgeon: [name], MD;  Location: [facility] ORS;  Service: Vascular;  Laterality: Left;  femerol  . FRACTURE SURGERY Right    SHOULDER  . INNER EAR SURGERY    . MANDIBLE SURGERY     TUMOR  . PERIPHERAL VASCULAR CATHETERIZATION N/A 05/21/2015   Procedure: Endovascular Repair/Stent Graft;  Surgeon: [name], MD;  Location: [place] CV LAB;  Service: Cardiovascular;  Laterality: N/A;  . PERIPHERAL VASCULAR CATHETERIZATION  06/20/2015   Procedure: Lower Extremity Angiography;  Surgeon: [name], MD;  Location: [place] CV LAB;  Service: Cardiovascular;;  . PERIPHERAL VASCULAR CATHETERIZATION  06/20/2015   Procedure: Lower Extremity Intervention;  Surgeon: [name], MD;  Location: [place] CV LAB;  Service: Cardiovascular;;    There were no vitals filed for this visit.  Subjective Assessment - 10/27/18 1410    Subjective   Patient continues to be focused on the news of the world right now.      Pertinent History  Patient with recent diagnosis of Parkinson's disease last fall.  Patient's wife passed away during the same time frame and now patient lives alone and has no family nearby.     Patient Stated Goals  Patient reports he would like to improve his balance and walking and remain independent as possible.     Currently  in Pain?  No/denies    Pain Score  0-No pain    Multiple Pain Sites  No        Neuromuscular Reeducation:  Patient seen for instruction of LSVT BIG exercises: LSVT Daily Session Maximal Daily Exercises:  Sustained movements are designed to rescale the amplitude of movement output for generalization  to daily functional activities. Performed as follows for 1 set of 10 repetitions each: Multi directional  sustained movements- 1) Floor to ceiling, 2) Side to side. Multi directional Repetitive movements  performed in standing and are designed to provide retraining effort needed for sustained muscle activation in tasks  Performed as follows: 3) Step and reach forward, 4) Step and Reach Backwards,  5) Step and reach sideways, 6) Rock and reach forward/backward, 7) Rock and reach sideways.  Exercises performed in standard version with occasional cues for form and technique and occasional CGA with exercises in standing for safety with balance. Continued focused on increasing size of arm and hand movements with exercises, performed exercises with alternating sides to increase complexity of task.With alternating sides with exercises, patient does well with the first 2 but has more difficulty with balance with the exercises which require standing balance.  Continued cues for amplitude of step size when performing directional stepping and with functional mobility skills. Functional mobility for 312feet this date for 2 trials, short rest break as needed, cues for reciprocal arm movements and amplitude of steps. No assistive device required, cues at times for turns and he still requires a short rest break due to slight shortness of breath.   ADL:  Functional component list:  Sit to stand from a variety of surface heights without the use of arms, required cues for technique from lowest mat height and regular chair  Stepping on and off curb when there is nothing to hold onto, CGA to min assist with task, able to complete if he has something to hold onto.  Negotiating steps patient seen for going up and down 5 steps with use of hand rail on both sides, alternating feet and steps going up but tends to come down step by step.  Reaching to tie shoes difficulty with crossed leg method .       Response to tx:  Patient has transitioned to performing exercises with increased complexity of alternating sides however, he still does this in the clinic only and performs at home with the regular standard version.  He reports his balance is challenged more with alternating sides and since he lives alone, he hasn't felt  comfortable yet doing this at home.  Recommended he utilize a chair beside him at home to hold during exercise in case he needs it.  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  Continue to work towards goals and encourage patient to be consistent with his exercises and make it a priority to put himself first.                      OT Education - 10/27/18 1410    Education Details  daily exercises, alternating sides, functional component tasks    Person(s) Educated  Patient    Methods  Explanation;Demonstration;Verbal cues;Handout    Comprehension  Verbalized understanding;Verbal cues required;Returned demonstration;Need further instruction          OT  Long Term Goals - 10/27/18 1324      OT LONG TERM GOAL #1   Title  Patient will improve gait speed and endurance and be able to walk 700 feet in 6 minutes to negotiate around the home and community safely in 4 weeks     Baseline  6 min walk test eval 560 feet    Time  4    Period  Weeks    Status  On-going      OT LONG TERM GOAL #2   Title  Patient will complete HEP for maximal daily exercises with modified independence in 4 weeks       Baseline  no current program     Time  4    Period  Weeks    Status  On-going      OT LONG TERM GOAL #3   Title  Patient will transfer from sit to stand without the use of arms safely and independently from a variety of chairs/surfaces in 4 weeks.    Baseline  demonstrates difficulty with sit to stand from low surfaces.     Time  4    Period  Weeks    Status  On-going      OT LONG TERM GOAL #4   Title  Patient will demonstrate reciprocal arm swing during functional mobility without cues.     Baseline  reciprocal arm swing absent on eval.     Time  4    Period  Weeks    Status  On-going      OT LONG  TERM GOAL #5   Status  On-going            Plan - 10/27/18 1411    Clinical Impression Statement  Patient has transitioned to performing exercises with increased complexity of alternating sides however, he still does this in the clinic only and performs at home with the regular standard version.  He reports his balance is challenged more with alternating sides and since he lives alone, he hasn't felt comfortable yet doing this at home.  Recommended he utilize a chair beside him at home to hold during exercise in case he needs it.  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  Continue to work towards goals and encourage patient to be consistent with his exercises and make it a priority to put himself first.      OT Occupational Profile and History  Detailed Assessment- Review of Records and additional review of physical, cognitive, psychosocial history related to current functional performance    Occupational performance deficits (Please refer to evaluation for details):  ADL's;IADL's;Leisure;Social Participation    Body Structure / Function / Physical Skills  ADL;Dexterity;Flexibility;Strength;Balance;Coordination;FMC;IADL;Endurance;Gait;UE functional use;Mobility    Cognitive Skills  Memory    Psychosocial Skills  Routines and Behaviors;Coping Strategies    Rehab Potential  Good    Clinical Decision Making  Several treatment options, min-mod task modification necessary    Comorbidities Affecting Occupational Performance:  May have comorbidities impacting occupational performance    Modification or Assistance to Complete Evaluation   Min-Moderate modification of tasks or assist with assess necessary to complete eval    OT Frequency  4x / week    OT Duration  4 weeks    OT Treatment/Interventions  Self-care/ADL  training;Therapeutic exercise;DME and/or AE instruction;Functional  Mobility Training;Cognitive remediation/compensation;Balance training;Neuromuscular education;Gait Optician, dispensing;Therapeutic activities;Patient/family education;Coping strategies training    Consulted and Agree with Plan of Care  Patient       Patient will benefit from skilled therapeutic intervention in order to improve the following deficits and impairments:  Body Structure / Function / Physical Skills, Psychosocial Skills, Cognitive Skills  Visit Diagnosis: Difficulty in walking, not elsewhere classified  Muscle weakness (generalized)  Other lack of coordination  Unsteadiness on feet    Problem List Patient Active Problem List   Diagnosis Date Noted  . Shoulder pain 08/11/2017  . Urinary urgency 08/11/2017  . Venous insufficiency of both lower extremities 10/25/2016  . Bilateral leg edema 10/19/2016  . Prostate cancer ([place]) 09/27/2016  . Essential hypertension 10/29/2015  . Hyperlipidemia 10/29/2015  . Esophageal reflux 10/29/2015  . Centrilobular emphysema ([place]) 10/29/2015  . Labyrinthitis 10/29/2015  . Microhematuria 09/29/2015  . Femoral artery aneurysm, left ([place]) 06/22/2015  . Cerebral vascular disease 06/21/2015  . AAA (abdominal aortic aneurysm) ([place]) 05/21/2015  . Degenerative disc disease, lumbar 04/24/2015  . Urinary incontinence, male, stress 04/24/2015  . Dizziness 01/20/2015  . Endocarditis 01/20/2015  . Aneurysm of abdominal vessel ([place]) 08/30/2014  . Chronic obstructive pulmonary disease ([place]) 08/30/2014  . Personal history of transient ischemic attack (TIA), and cerebral infarction without residual deficits 08/30/2014    Amy T Tomasita Morrow, OTR/L, CLT  [place],Amy 10/27/2018, 2:51 PM  [place] MAIN [facility] SERVICES [address] [place], [place], [address] Phone: [phone]   Fax:  [phone]  Name: Howard Anderson MRN:  [id number] Date of Birth: 04-03-1946

## 2018-10-30 ENCOUNTER — Ambulatory Visit: Payer: Medicare Other | Admitting: Occupational Therapy

## 2018-10-31 ENCOUNTER — Ambulatory Visit: Payer: Medicare Other | Admitting: Occupational Therapy

## 2018-11-01 ENCOUNTER — Ambulatory Visit: Payer: Medicare Other | Attending: Neurology | Admitting: Occupational Therapy

## 2018-11-02 ENCOUNTER — Ambulatory Visit: Payer: Medicare Other | Admitting: Occupational Therapy

## 2018-11-04 ENCOUNTER — Other Ambulatory Visit: Payer: Self-pay | Admitting: Family Medicine

## 2018-11-04 DIAGNOSIS — E782 Mixed hyperlipidemia: Secondary | ICD-10-CM

## 2018-11-06 ENCOUNTER — Ambulatory Visit: Payer: Medicare Other | Admitting: Occupational Therapy

## 2018-11-15 ENCOUNTER — Other Ambulatory Visit: Payer: Self-pay | Admitting: Family Medicine

## 2018-11-15 DIAGNOSIS — C61 Malignant neoplasm of prostate: Secondary | ICD-10-CM

## 2018-11-16 DIAGNOSIS — I10 Essential (primary) hypertension: Secondary | ICD-10-CM | POA: Diagnosis not present

## 2018-11-16 DIAGNOSIS — I38 Endocarditis, valve unspecified: Secondary | ICD-10-CM | POA: Diagnosis not present

## 2018-11-16 DIAGNOSIS — E782 Mixed hyperlipidemia: Secondary | ICD-10-CM | POA: Diagnosis not present

## 2018-11-17 ENCOUNTER — Encounter: Payer: Self-pay | Admitting: Family Medicine

## 2018-11-17 ENCOUNTER — Other Ambulatory Visit: Payer: Self-pay

## 2018-11-17 ENCOUNTER — Ambulatory Visit (INDEPENDENT_AMBULATORY_CARE_PROVIDER_SITE_OTHER): Payer: Medicare Other | Admitting: Family Medicine

## 2018-11-17 VITALS — BP 134/70 | Ht 67.0 in | Wt 202.0 lb

## 2018-11-17 DIAGNOSIS — F329 Major depressive disorder, single episode, unspecified: Secondary | ICD-10-CM | POA: Diagnosis not present

## 2018-11-17 DIAGNOSIS — E782 Mixed hyperlipidemia: Secondary | ICD-10-CM | POA: Diagnosis not present

## 2018-11-17 DIAGNOSIS — K219 Gastro-esophageal reflux disease without esophagitis: Secondary | ICD-10-CM

## 2018-11-17 MED ORDER — SERTRALINE HCL 50 MG PO TABS: 50.0000 mg | ORAL_TABLET | Freq: Every day | ORAL | 1 refills | Status: DC

## 2018-11-17 MED ORDER — FAMOTIDINE 40 MG PO TABS: 40.0000 mg | ORAL_TABLET | Freq: Every day | ORAL | 1 refills | Status: DC

## 2018-11-17 MED ORDER — ATORVASTATIN CALCIUM 10 MG PO TABS: ORAL_TABLET | ORAL | 1 refills | Status: DC

## 2018-11-17 NOTE — Progress Notes (Addendum)
Date:  11/17/2018   Name:  Howard Anderson   DOB:  1945/12/04   MRN:  [id number]   Chief Complaint: Hyperlipidemia; Gastroesophageal Reflux; and Depression  Patient is a [name] year old male who presents for a comprehensive physical exam. The patient reports the following problems: med refill. Health maintenance has been reviewed up to date         I connected withthis patient, , by telephoneat the patient's office.  I verified that I am speaking with the correct person using two identifiers. This visit was conducted via telephone due to the Covid-19 outbreak from my office at [facility] in [place], [place]. I discussed the limitations, risks, security and privacy concerns of performing an evaluation and management service by telephone. I also discussed with the patient that there may be a patient responsible charge related to this service. The patient expressed understanding and agreed to proceed.  Hyperlipidemia  This is a chronic problem. The current episode started more than 1 year ago. The problem is controlled. Recent lipid tests were reviewed and are normal. He has no history of chronic renal disease, diabetes, hypothyroidism, liver disease, obesity or nephrotic syndrome. Factors aggravating his hyperlipidemia include thiazides. Pertinent negatives include no chest pain, focal sensory loss, focal weakness, leg pain, myalgias or shortness of breath. Current antihyperlipidemic treatment includes statins. The current treatment provides moderate improvement of lipids. There are no compliance problems.  Risk factors for coronary artery disease include dyslipidemia.  Gastroesophageal Reflux  He reports no abdominal pain, no belching, no chest pain, no choking, no coughing, no dysphagia, no early satiety, no globus sensation, no heartburn, no hoarse voice, no nausea, no sore throat, no stridor or no wheezing. This is a chronic problem. The current episode started in the past 7 days. The  problem occurs frequently. The problem has been unchanged. The symptoms are aggravated by certain foods. Pertinent negatives include no anemia, fatigue, melena, muscle weakness, orthopnea or weight loss. He has tried a histamine-2 antagonist for the symptoms. The treatment provided mild relief.  Depression       The patient presents with depression.  This is a chronic problem.  The current episode started more than 1 year ago.   The onset quality is gradual.   The problem occurs intermittently.  The problem has been waxing and waning since onset.  Associated symptoms include sad.  Associated symptoms include no decreased concentration, no fatigue, no helplessness, no hopelessness, does not have insomnia, not irritable, no restlessness, no decreased interest, no appetite change, no body aches, no myalgias, no headaches, no indigestion and no suicidal ideas.  Past treatments include SSRIs - Selective serotonin reuptake inhibitors.  Previous treatment provided mild relief.  Past medical history includes depression.     Pertinent negatives include no hypothyroidism.   Review of Systems  Constitutional: Negative for appetite change, fatigue and weight loss.  HENT: Negative for hoarse voice and sore throat.   Respiratory: Negative for cough, choking, shortness of breath and wheezing.   Cardiovascular: Negative for chest pain.  Gastrointestinal: Negative for abdominal pain, dysphagia, heartburn, melena and nausea.  Musculoskeletal: Negative for myalgias and muscle weakness.  Neurological: Negative for focal weakness and headaches.  Psychiatric/Behavioral: Positive for depression. Negative for decreased concentration and suicidal ideas. The patient does not have insomnia.     Patient Active Problem List   Diagnosis Date Noted  . Shoulder pain 08/11/2017  . Urinary urgency 08/11/2017  . Venous insufficiency of both  lower extremities 10/25/2016  . Bilateral leg edema 10/19/2016  . Prostate cancer ([place])  09/27/2016  . Essential hypertension 10/29/2015  . Hyperlipidemia 10/29/2015  . Esophageal reflux 10/29/2015  . Centrilobular emphysema ([place]) 10/29/2015  . Labyrinthitis 10/29/2015  . Microhematuria 09/29/2015  . 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 Cerebral vascular disease 06/21/2015  . AAA (abdominal aortic aneurysm) ([place]) 05/21/2015  . Degenerative disc disease, lumbar 04/24/2015  . Urinary incontinence, male, stress 04/24/2015  . Dizziness 01/20/2015  . Endocarditis 01/20/2015  . Aneurysm of abdominal vessel ([place]) 08/30/2014  . Chronic obstructive pulmonary disease ([place]) 08/30/2014  . Personal history of transient ischemic attack (TIA), and cerebral infarction without residual deficits 08/30/2014    No Known Allergies  Past Surgical History:  Procedure Laterality Date  . CATARACT EXTRACTION W/PHACO Left 09/27/2018   Procedure: CATARACT EXTRACTION PHACO AND INTRAOCULAR LENS PLACEMENT ([place]) LEFT;  Surgeon: Leandrew Koyanagi, MD;  Location: [facility];  Service: Ophthalmology;  Laterality: Left;  [place]  00:42 AP% 15.1 CDE 5.16 Fluid pack lot # [id number]  . COLONOSCOPY  2010  . EMBOLECTOMY Left 06/20/2015   Procedure: EMBOLECTOMY;  Surgeon: [name], MD;  Location: [facility];  Service: Vascular;  Laterality: Left;  brachial thrombectomy  . ENDARTERECTOMY FEMORAL Left 06/20/2015   Procedure: ENDARTERECTOMY FEMORAL/FEMORAL PSEUDOANEURYSM repair;  Surgeon: [name], MD;  Location: [facility];  Service: Vascular;  Laterality: Left;  femerol  . FRACTURE SURGERY Right    SHOULDER  . INNER EAR SURGERY    . MANDIBLE SURGERY     TUMOR  . PERIPHERAL VASCULAR CATHETERIZATION N/A 05/21/2015   Procedure: Endovascular Repair/Stent Graft;  Surgeon: [name], MD;  Location: [place] [facility];  Service: Cardiovascular;  Laterality: N/A;  . PERIPHERAL VASCULAR CATHETERIZATION  06/20/2015   Procedure: Lower Extremity Angiography;  Surgeon: [name],  MD;  Location: [place] [facility];  Service: Cardiovascular;;  . PERIPHERAL VASCULAR CATHETERIZATION  06/20/2015   Procedure: Lower Extremity Intervention;  Surgeon: [name], MD;  Location: [place] [facility];  Service: Cardiovascular;;    Social History   Tobacco Use  . Smoking status: Former Smoker    Packs/day: 1.50    Years: 48.00    Pack years: 72.00    Last attempt to quit: 10/10/2013    Years since quitting: 5.1  . Smokeless tobacco: Never Used  Substance Use Topics  . Alcohol use: No  . Drug use: No     Medication list has been reviewed and updated.  Current Meds  Medication Sig  . aspirin 81 MG tablet Take 1 tablet (81 mg total) by mouth daily.  [name] atorvastatin (LIPITOR) 10 MG tablet TAKE ONE TABLET EVERY DAY  . brimonidine-timolol (COMBIGAN) 0.2-0.5 % ophthalmic solution Place 1 drop into both eyes every 12 (twelve) hours.  . carbidopa-levodopa (SINEMET IR) 25-100 MG tablet Take 1.5 tablets by mouth 4 (four) times daily. Dr Manuella Ghazi  . Cholecalciferol (VITAMIN D3) 1000 UNITS CAPS Take 1,000 Units by mouth daily.   . dorzolamide (TRUSOPT) 2 % ophthalmic solution Place 1 drop into both eyes 2 (two) times daily.   [name] latanoprost (XALATAN) 0.005 % ophthalmic solution Place 1 drop into both eyes at bedtime.   . mirabegron ER (MYRBETRIQ) 25 MG TB24 tablet Take 1 tablet (25 mg total) by mouth daily. (Patient taking differently: Take 25 mg by mouth daily. urology)  . Misc Natural Products (CYSTEX) LIQD Take 1 Dose by mouth daily.   [name]  Multiple Vitamins-Minerals (PRESERVISION AREDS PO) Take 1 capsule by mouth 2 (two) times daily.  . ranitidine (ZANTAC) 150 MG tablet Take 0.5 tablets (75 mg total) by mouth daily. (Patient taking differently: Take 150 mg by mouth daily. )  . sertraline (ZOLOFT) 50 MG tablet Take 1 tablet (50 mg total) by mouth daily.  . tamsulosin (FLOMAX) 0.4 MG CAPS capsule TAKE 1 CAPSULE EVERY DAY NEEDS APPT FOR MORE REFILLS (Patient taking differently:  0.4 mg. urologist)  . traZODone (DESYREL) 50 MG tablet Take 50 mg by mouth at bedtime.  . valsartan (DIOVAN) 80 MG tablet TAKE 1 TABLET (80 MG TOTAL) BY MOUTH ONCE DAILY. (Patient taking differently: Take 80 mg by mouth daily. TAKE 1 TABLET (80 MG TOTAL) BY MOUTH ONCE DAILY./ cardio)  . vitamin B-12 (CYANOCOBALAMIN) 1000 MCG tablet Take 1,000 mcg by mouth daily.    PHQ 2/9 Scores 11/17/2018 12/08/2017 09/08/2016 04/28/2016  PHQ - 2 Score 1 0 0 0  PHQ- 9 Score 2 - - -    BP Readings from Last 3 Encounters:  11/17/18 134/70  10/09/18 124/79  09/27/18 130/88    Physical Exam Vitals signs and nursing note reviewed.  Constitutional:      General: He is not irritable.    Wt Readings from Last 3 Encounters:  11/17/18 202 lb (91.6 kg)  10/09/18 203 lb (92.1 kg)  09/20/18 205 lb (93 kg)    BP 134/70   Ht 5\' 7"  (1.702 m)   Wt 202 lb (91.6 kg)   BMI 31.64 kg/m   Assessment and Plan:  1. Mixed hyperlipidemia Chronic.  Controlled.  Reviewed his last lipid panel which was acceptable.  Continue atorvastatin 10 mg once a day. - atorvastatin (LIPITOR) 10 MG tablet; TAKE ONE TABLET EVERY DAY  Dispense: 90 tablet; Refill: 1  2. Reactive depression Given the circumstances of the recent loss of his spouse patient is doing relatively well patient will continue Zoloft 50 mg 1 a day.  Discussed the stresses of the current COVID-19 situation the patient is handling that well. - sertraline (ZOLOFT) 50 MG tablet; Take 1 tablet (50 mg total) by mouth daily.  Dispense: 90 tablet; Refill: 1  3. Gastroesophageal reflux disease, esophagitis presence not specified Chronic.  Controlled.  Patient will discontinue Zantac and will place with Pepcid 40 mg once a day. - famotidine (PEPCID) 40 MG tablet; Take 1 tablet (40 mg total) by mouth daily.  Dispense: 90 tablet; Refill: 1  I spent 10 minutes with this patient, More than 50% of that time was spent in voice to voice education, counseling and care  coordination.

## 2018-11-29 ENCOUNTER — Other Ambulatory Visit: Payer: Self-pay

## 2018-11-29 DIAGNOSIS — C61 Malignant neoplasm of prostate: Secondary | ICD-10-CM

## 2018-11-29 MED ORDER — TAMSULOSIN HCL 0.4 MG PO CAPS: 0.4000 mg | ORAL_CAPSULE | Freq: Every day | ORAL | 3 refills | Status: DC

## 2018-12-13 ENCOUNTER — Ambulatory Visit: Admit: 2018-12-13 | Payer: Medicare Other | Admitting: Ophthalmology

## 2018-12-13 SURGERY — PHACOEMULSIFICATION, CATARACT, WITH IOL INSERTION
Anesthesia: Choice | Laterality: Right

## 2019-01-18 ENCOUNTER — Other Ambulatory Visit: Payer: Self-pay

## 2019-01-18 ENCOUNTER — Ambulatory Visit (INDEPENDENT_AMBULATORY_CARE_PROVIDER_SITE_OTHER): Payer: Medicare Other | Admitting: Vascular Surgery

## 2019-01-18 ENCOUNTER — Ambulatory Visit (INDEPENDENT_AMBULATORY_CARE_PROVIDER_SITE_OTHER): Payer: Medicare Other

## 2019-01-18 ENCOUNTER — Encounter (INDEPENDENT_AMBULATORY_CARE_PROVIDER_SITE_OTHER): Payer: Self-pay | Admitting: Vascular Surgery

## 2019-01-18 VITALS — BP 120/74 | HR 82 | Resp 14 | Ht 67.0 in | Wt 204.0 lb

## 2019-01-18 DIAGNOSIS — Z95828 Presence of other vascular implants and grafts: Secondary | ICD-10-CM

## 2019-01-18 DIAGNOSIS — I872 Venous insufficiency (chronic) (peripheral): Secondary | ICD-10-CM

## 2019-01-18 DIAGNOSIS — Z87891 Personal history of nicotine dependence: Secondary | ICD-10-CM

## 2019-01-18 DIAGNOSIS — E782 Mixed hyperlipidemia: Secondary | ICD-10-CM

## 2019-01-18 DIAGNOSIS — I714 Abdominal aortic aneurysm, without rupture, unspecified: Secondary | ICD-10-CM

## 2019-01-18 DIAGNOSIS — J439 Emphysema, unspecified: Secondary | ICD-10-CM

## 2019-01-18 DIAGNOSIS — I724 Aneurysm of artery of lower extremity: Secondary | ICD-10-CM | POA: Diagnosis not present

## 2019-01-19 ENCOUNTER — Encounter (INDEPENDENT_AMBULATORY_CARE_PROVIDER_SITE_OTHER): Payer: Self-pay | Admitting: Vascular Surgery

## 2019-01-19 NOTE — Progress Notes (Signed)
MRN : [id number]  Howard Anderson is a 73 y.o. ([birth year]) male who presents with chief complaint of  Chief Complaint  Patient presents with   Follow-up  .  History of Present Illness:   The patient returns to the office for surveillance of an abdominal aortic aneurysm status post stent graft placement on 05/21/2015.   Patient denies abdominal pain or back pain, no other abdominal complaints. No groin related complaints. No symptoms consistent with distal embolization No changes in claudication distance.   There have been no interval changes in his overall healthcare since his last visit.   Patient denies amaurosis fugax or TIA symptoms. There is no history of claudication or rest pain symptoms of the lower extremities.   No surgery or intervention at this point in time.    I have reviewed my discussion with the patient regarding venous insufficiency and secondary lymph edema and why it  causes symptoms. I have discussed with the patient the chronic skin changes that accompany these problems and the long term sequela such as ulceration and infection.  Patient will continue wearing graduated compression stockings class 1 (20-30 mmHg) on a daily basis a prescription was given to the patient to keep this updated. The patient will  put the stockings on first thing in the morning and removing them in the evening. The patient is instructed specifically not to sleep in the stockings.  In addition, behavioral modification including elevation during the day will be continued.  Diet and salt restriction was also discussed.  Previous duplex ultrasound of the lower extremities shows normal deep venous system, superficial reflux was not present.   The patient denies angina or shortness of breath.   Duplex US of the aorta and iliac arteries shows a 6.3 AAA sac with no endoleak, no significant change in the sac compared to the previous study.  Current Meds  Medication Sig   aspirin 81 MG tablet  Take 1 tablet (81 mg total) by mouth daily.    Past Medical History:  Diagnosis Date   AAA (abdominal aortic aneurysm) without rupture (HCC)    AAA (abdominal aortic aneurysm) without rupture (HCC)    Cancer (HCC)    prostate cancer   Chest pain    COPD (chronic obstructive pulmonary disease) (HCC)    Depression    Dizziness and giddiness    Dvt femoral (deep venous thrombosis) (HCC)    H/O LEG   GERD (gastroesophageal reflux disease)    Heart disease    Hyperlipidemia    Hypertension    Parkinson disease (HCC)    Prostate cancer ([place])    Shortness of breath dyspnea    DOE   Shoulder fracture, right    Stroke ([place])    tia    Past Surgical History:  Procedure Laterality Date   CATARACT EXTRACTION W/PHACO Left 09/27/2018   Procedure: CATARACT EXTRACTION PHACO AND INTRAOCULAR LENS PLACEMENT ([place]) LEFT;  Surgeon: Leandrew Koyanagi, MD;  Location: [facility];  Service: Ophthalmology;  Laterality: Left;  [place]  00:42 AP% 15.1 CDE 5.16 Fluid pack lot # [id number]   COLONOSCOPY  2010   EMBOLECTOMY Left 06/20/2015   Procedure: EMBOLECTOMY;  Surgeon: [name], MD;  Location: [facility];  Service: Vascular;  Laterality: Left;  brachial thrombectomy   ENDARTERECTOMY FEMORAL Left 06/20/2015   Procedure: ENDARTERECTOMY FEMORAL/FEMORAL PSEUDOANEURYSM repair;  Surgeon: [name], MD;  Location: [facility];  Service: Vascular;  Laterality: Left;  femerol   FRACTURE SURGERY Right  SHOULDER   INNER EAR SURGERY     MANDIBLE SURGERY     TUMOR   PERIPHERAL VASCULAR CATHETERIZATION N/A 05/21/2015   Procedure: Endovascular Repair/Stent Graft;  Surgeon: [name], MD;  Location: [place] [facility];  Service: Cardiovascular;  Laterality: N/A;   PERIPHERAL VASCULAR CATHETERIZATION  06/20/2015   Procedure: Lower Extremity Angiography;  Surgeon: [name], MD;  Location: [place] [facility];  Service: Cardiovascular;;   PERIPHERAL VASCULAR  CATHETERIZATION  06/20/2015   Procedure: Lower Extremity Intervention;  Surgeon: [name], MD;  Location: [place] [facility];  Service: Cardiovascular;;    Social History Social History   Tobacco Use   Smoking status: Former Smoker    Packs/day: 1.50    Years: 48.00    Pack years: 72.00    Quit date: 10/10/2013    Years since quitting: 5.2   Smokeless tobacco: Never Used  Substance Use Topics   Alcohol use: No   Drug use: No    Family History Family History  Problem Relation Age of Onset   Prostate cancer Brother    Bladder Cancer Neg Hx    Kidney cancer Neg Hx     No Known Allergies   REVIEW OF SYSTEMS (Negative unless checked)  Constitutional: [] Weight loss  [] Fever  [] Chills Cardiac: [] Chest pain   [] Chest pressure   [] Palpitations   [] Shortness of breath when laying flat   [x] Shortness of breath with exertion. Vascular:  [x] Pain in legs with walking   [] Pain in legs at rest  [] History of DVT   [] Phlebitis   [x] Swelling in legs   [] Varicose veins   [] Non-healing ulcers Pulmonary:   [] Uses home oxygen   [] Productive cough   [] Hemoptysis   [] Wheeze  [] COPD   [] Asthma Neurologic:  [] Dizziness   [] Seizures   [] History of stroke   [] History of TIA  [] Aphasia   [] Vissual changes   [] Weakness or numbness in arm   [] Weakness or numbness in leg Musculoskeletal:   [] Joint swelling   [] Joint pain   [] Low back pain Hematologic:  [] Easy bruising  [] Easy bleeding   [] Hypercoagulable state   [] Anemic Gastrointestinal:  [] Diarrhea   [] Vomiting  [] Gastroesophageal reflux/heartburn   [] Difficulty swallowing. Genitourinary:  [] Chronic kidney disease   [] Difficult urination  [] Frequent urination   [] Blood in urine Skin:  [x] Rashes   [] Ulcers  Psychological:  [] History of anxiety   []  History of major depression.  Physical Examination  Vitals:   01/18/19 1004  BP: 120/74  Pulse: 82  Resp: 14  Weight: 204 lb (92.5 kg)  Height: 5\' 7"  (1.702 m)   Body mass index is  31.95 kg/m. Gen: WD/WN, NAD obese Head: [place]/AT, No temporalis wasting.  Ear/Nose/Throat: Hearing grossly intact, nares w/o erythema or drainage Eyes: PER, EOMI, sclera nonicteric.  Neck: Supple, no large masses.   Pulmonary:  Good air movement, no audible wheezing bilaterally, no use of accessory muscles.  Cardiac: RRR, no JVD Vascular: scattered varicosities present bilaterally.  Mild venous stasis changes to the legs bilaterally.  2+ soft pitting edema Vessel Right Left  Radial Palpable Palpable  Gastrointestinal: Non-distended. No guarding/no peritoneal signs.  Musculoskeletal: M/S 5/5 throughout.  No deformity or atrophy.  Neurologic: CN 2-12 intact. Symmetrical.  Speech is fluent. Motor exam as listed above. Psychiatric: Judgment intact, Mood & affect appropriate for pt's clinical situation. Dermatologic: No rashes or ulcers noted.  No changes consistent with cellulitis. Lymph : No lichenification or skin changes of chronic lymphedema.  CBC Lab Results  Component Value Date   WBC 5.8 03/09/2016   HGB 14.6 03/09/2016   HCT 43.3 03/09/2016   MCV 88.2 03/09/2016   PLT 263 03/09/2016    BMET    Component Value Date/Time   NA 140 09/27/2016 0000   NA 139 08/02/2014 1128   K 4.8 09/27/2016 0000   K 4.2 08/02/2014 1128   CL 102 09/27/2016 0000   CL 106 08/02/2014 1128   CO2 23 09/27/2016 0000   CO2 25 08/02/2014 1128   GLUCOSE 123 (H) 09/27/2016 0000   GLUCOSE 129 (H) 01/01/2016 0526   GLUCOSE 175 (H) 08/02/2014 1128   BUN 18 09/27/2016 0000   BUN 16 08/02/2014 1128   CREATININE 1.00 09/27/2016 0000   CREATININE 0.74 08/03/2014 0830   CALCIUM 9.3 09/27/2016 0000   CALCIUM 7.9 (L) 08/02/2014 1128   GFRNONAA 75 09/27/2016 0000   GFRNONAA >60 08/03/2014 0830   GFRNONAA >60 10/18/2013 0502   GFRAA 87 09/27/2016 0000   GFRAA >60 08/03/2014 0830   GFRAA >60 10/18/2013 0502   CrCl cannot be calculated (Patient's most recent lab result is older than the maximum 21 days  allowed.).  COAG Lab Results  Component Value Date   INR 0.98 05/08/2015    Radiology Vas [place] Evar Duplex  Result Date: 01/18/2019 Endovascular Aortic Repair Study (EVAR) Limitations: Air/bowel gas and obesity.  Comparison Study: 06/08/2018 Performing Technologist: Charlane Ferretti RT (R)(VS)  Examination Guidelines: A complete evaluation includes B-mode imaging, spectral Doppler, color Doppler, and power Doppler as needed of all accessible portions of each vessel. Bilateral testing is considered an integral part of a complete examination. Limited examinations for reoccurring indications may be performed as noted.  Abdominal Aorta Findings: +-------------+-------+----------+----------+--------+--------+--------+  Location      AP (cm) Trans (cm) PSV (cm/s) Waveform Thrombus Comments  +-------------+-------+----------+----------+--------+--------+--------+  Distal        3.31    3.05       38                                     +-------------+-------+----------+----------+--------+--------+--------+  RT CIA Prox   2.4     2.8        121                                    +-------------+-------+----------+----------+--------+--------+--------+  RT CIA Mid    6.3     3.0        146                                    +-------------+-------+----------+----------+--------+--------+--------+  RT CIA Distal 1.4     1.2        52                                     +-------------+-------+----------+----------+--------+--------+--------+  RT EIA Prox   0.9     1.2        127                                    +-------------+-------+----------+----------+--------+--------+--------+  LT CIA Prox   1.6     1.6        58                                     +-------------+-------+----------+----------+--------+--------+--------+  LT EIA Prox   0.9     0.8        110                                    +-------------+-------+----------+----------+--------+--------+--------+ Endovascular Aortic Repair (EVAR):  +----------+----------------+-------------------+-------------------+             Diameter AP (cm) Diameter Trans (cm) Velocities (cm/sec)  +----------+----------------+-------------------+-------------------+  Aorta      6.30             3.00                146                  +----------+----------------+-------------------+-------------------+  Right Limb 1.40             1.20                52                   +----------+----------------+-------------------+-------------------+  Left Limb  1.60             1.60                58                   +----------+----------------+-------------------+-------------------+  Summary: Abdominal Aorta: The largest aortic measurement is 6.3 cm. Patent endovascular aneurysm repair with no evidence of endoleak. Limited exam due to bowel gas. Aneurysm appears slightly larger than previous exam on 06/08/2018. This may be due to poor visualization due to bowel gas and abdominal girth. The largest aortic diameter has increased compared to prior exam. Previous diameter measurement was 5.6 cm.  *See table(s) above for measurements and observations.  Electronically signed by Hortencia Pilar MD on 01/18/2019 at 4:43:32 PM.   Final      Assessment/Plan 1. Abdominal aortic aneurysm (AAA) without rupture (HCC) Recommend: Patient is status post successful endovascular repair of the AAA.   No further intervention is required at this time.   No endoleak is detected and the aneurysm sac is stable.  The patient will continue antiplatelet therapy as prescribed as well as aggressive management of hyperlipidemia. Exercise is again strongly encouraged.   However, endografts require continued surveillance with ultrasound or CT scan. This is mandatory to detect any changes that allow repressurization of the aneurysm sac.  The patient is informed that this would be asymptomatic.  The patient is reminded that lifelong routine surveillance is a necessity with an endograft. Patient  will continue to follow-up at 12 month intervals with ultrasound of the aorta.  - VAS US AORTA/IVC/ILIACS; Future  2. Venous insufficiency of both lower extremities  No surgery or intervention at this point in time.    I have reviewed my previous discussion with the patient regarding swelling and why it  causes symptoms.  The patient is doing well with compression and will continue wearing graduated compression stockings class 1 (20-30 mmHg) on a daily basis a prescription was given. The patient will  continue wearing the  stockings first thing in the morning and removing them in the evening. The patient is instructed specifically not to sleep in the stockings.    In addition, behavioral modification including elevation during the day and exercise will be continued.    Patient should follow-up on an annual basis   3. Pulmonary emphysema, unspecified emphysema type ([place]) Continue pulmonary medications and aerosols as already ordered, these medications have been reviewed and there are no changes at this time.    4. Mixed hyperlipidemia Continue statin as ordered and reviewed, no changes at this time   5. Femoral artery aneurysm, left [facility]) Follow with ultrasound on a biannual basis    Hortencia Pilar, MD  01/19/2019 8:09 AM

## 2019-02-01 DIAGNOSIS — H353132 Nonexudative age-related macular degeneration, bilateral, intermediate dry stage: Secondary | ICD-10-CM | POA: Diagnosis not present

## 2019-02-01 DIAGNOSIS — H2511 Age-related nuclear cataract, right eye: Secondary | ICD-10-CM | POA: Diagnosis not present

## 2019-02-22 ENCOUNTER — Other Ambulatory Visit: Admission: RE | Admit: 2019-02-22 | Payer: Medicare Other | Source: Ambulatory Visit

## 2019-02-22 NOTE — Discharge Instructions (Signed)

## 2019-02-23 ENCOUNTER — Other Ambulatory Visit
Admission: RE | Admit: 2019-02-23 | Discharge: 2019-02-23 | Disposition: A | Discharge status: Home / Self Care | Payer: Medicare Other | Source: Ambulatory Visit | Attending: Ophthalmology | Admitting: Ophthalmology

## 2019-02-23 ENCOUNTER — Other Ambulatory Visit: Payer: Self-pay

## 2019-02-23 DIAGNOSIS — Z1159 Encounter for screening for other viral diseases: Secondary | ICD-10-CM | POA: Diagnosis not present

## 2019-02-24 LAB — SARS CORONAVIRUS 2 (TAT 6-24 HRS): SARS Coronavirus 2: NEGATIVE

## 2019-02-26 ENCOUNTER — Encounter: Payer: Self-pay | Admitting: *Deleted

## 2019-02-26 ENCOUNTER — Other Ambulatory Visit: Payer: Self-pay

## 2019-02-26 NOTE — Anesthesia Preprocedure Evaluation (Addendum)
Anesthesia Evaluation  Patient identified by MRN, date of birth, ID band Patient awake    Reviewed: Allergy & Precautions, NPO status , Patient's Chart, lab work & pertinent test results  History of Anesthesia Complications Negative for: history of anesthetic complications  Airway Mallampati: III   Neck ROM: Full    Dental  (+)    Pulmonary COPD, former smoker (quit 2015),    Pulmonary exam normal breath sounds clear to auscultation       Cardiovascular hypertension, + Peripheral Vascular Disease (AAA s/p repair 2016)   Rhythm:Regular Rate:Normal + Systolic murmurs Murmur; hx DVT   Neuro/Psych PSYCHIATRIC DISORDERS Depression TIA (approx 2015, no deficits) Neuromuscular disease (Parkinson disease)    GI/Hepatic GERD  ,  Endo/Other  negative endocrine ROS  Renal/GU negative Renal ROS     Musculoskeletal   Abdominal   Peds  Hematology Prostate CA   Anesthesia Other Findings Vascular surgery note 01/19/19:  Assessment/Plan 1. Abdominal aortic aneurysm (AAA) without rupture (HCC) Recommend: Patient is status post successful endovascular repair of the AAA.   No further intervention is required at this time.   No endoleak is detected and the aneurysm sac is stable.  The patient will continue antiplatelet therapy as prescribed as well as aggressive management of hyperlipidemia. Exercise is again strongly encouraged.   However, endografts require continued surveillance with ultrasound or CT scan. This is mandatory to detect any changes that allow repressurization of the aneurysm sac.  The patient is informed that this would be asymptomatic.  The patient is reminded that lifelong routine surveillance is a necessity with an endograft. Patient will continue to follow-up at 12 month intervals with ultrasound of the aorta.  - VAS US AORTA/IVC/ILIACS; Future  2. Venous insufficiency of both lower extremities  No  surgery or intervention at this point in time.    I have reviewed my previous discussion with the patient regarding swelling and why it  causes symptoms.  The patient is doing well with compression and will continue wearing graduated compression stockings class 1 (20-30 mmHg) on a daily basis a prescription was given. The patient will  continue wearing the stockings first thing in the morning and removing them in the evening. The patient is instructed specifically not to sleep in the stockings.    In addition, behavioral modification including elevation during the day and exercise will be continued.    Patient should follow-up on an annual basis   3. Pulmonary emphysema, unspecified emphysema type ([place]) Continue pulmonary medications and aerosols as already ordered, these medications have been reviewed and there are no changes at this time.  4. Mixed hyperlipidemia Continue statin as ordered and reviewed, no changes at this time  5. Femoral artery aneurysm, left (HCC) Follow with ultrasound on a biannual basis  Hortencia Pilar, MD  Reproductive/Obstetrics                            Anesthesia Physical Anesthesia Plan  ASA: IV  Anesthesia Plan: MAC   Post-op Pain Management:    Induction: Intravenous  PONV Risk Score and Plan: 1 and TIVA and Midazolam  Airway Management Planned: Natural Airway  Additional Equipment:   Intra-op Plan:   Post-operative Plan:   Informed Consent: I have reviewed the patients History and Physical, chart, labs and discussed the procedure including the risks, benefits and alternatives for the proposed anesthesia with the patient or authorized representative who has indicated his/her understanding and  acceptance.       Plan Discussed with: CRNA  Anesthesia Plan Comments:        Anesthesia Quick Evaluation

## 2019-02-28 ENCOUNTER — Encounter
Admission: RE | Disposition: A | Discharge status: Home / Self Care | Payer: Self-pay | Source: Home / Self Care | Attending: Ophthalmology

## 2019-02-28 ENCOUNTER — Ambulatory Visit: Payer: Medicare Other | Admitting: Anesthesiology

## 2019-02-28 ENCOUNTER — Other Ambulatory Visit: Payer: Self-pay

## 2019-02-28 ENCOUNTER — Ambulatory Visit
Admission: RE | Admit: 2019-02-28 | Discharge: 2019-02-28 | Disposition: A | Discharge status: Home / Self Care | Payer: Medicare Other | Attending: Ophthalmology | Admitting: Ophthalmology

## 2019-02-28 DIAGNOSIS — G20 Parkinson's disease: Secondary | ICD-10-CM | POA: Diagnosis not present

## 2019-02-28 DIAGNOSIS — K219 Gastro-esophageal reflux disease without esophagitis: Secondary | ICD-10-CM | POA: Insufficient documentation

## 2019-02-28 DIAGNOSIS — H5703 Miosis: Secondary | ICD-10-CM | POA: Diagnosis not present

## 2019-02-28 DIAGNOSIS — H2511 Age-related nuclear cataract, right eye: Secondary | ICD-10-CM | POA: Insufficient documentation

## 2019-02-28 DIAGNOSIS — Z8546 Personal history of malignant neoplasm of prostate: Secondary | ICD-10-CM | POA: Insufficient documentation

## 2019-02-28 DIAGNOSIS — F329 Major depressive disorder, single episode, unspecified: Secondary | ICD-10-CM | POA: Insufficient documentation

## 2019-02-28 DIAGNOSIS — I10 Essential (primary) hypertension: Secondary | ICD-10-CM | POA: Insufficient documentation

## 2019-02-28 DIAGNOSIS — Z923 Personal history of irradiation: Secondary | ICD-10-CM | POA: Diagnosis not present

## 2019-02-28 DIAGNOSIS — Z8673 Personal history of transient ischemic attack (TIA), and cerebral infarction without residual deficits: Secondary | ICD-10-CM | POA: Diagnosis not present

## 2019-02-28 DIAGNOSIS — H25811 Combined forms of age-related cataract, right eye: Secondary | ICD-10-CM | POA: Diagnosis not present

## 2019-02-28 DIAGNOSIS — I2510 Atherosclerotic heart disease of native coronary artery without angina pectoris: Secondary | ICD-10-CM | POA: Diagnosis not present

## 2019-02-28 DIAGNOSIS — E785 Hyperlipidemia, unspecified: Secondary | ICD-10-CM | POA: Insufficient documentation

## 2019-02-28 DIAGNOSIS — Z87891 Personal history of nicotine dependence: Secondary | ICD-10-CM | POA: Insufficient documentation

## 2019-02-28 DIAGNOSIS — Z86718 Personal history of other venous thrombosis and embolism: Secondary | ICD-10-CM | POA: Insufficient documentation

## 2019-02-28 HISTORY — PX: CATARACT EXTRACTION W/PHACO: SHX586

## 2019-02-28 SURGERY — PHACOEMULSIFICATION, CATARACT, WITH IOL INSERTION
Anesthesia: Monitor Anesthesia Care | Site: Eye | Laterality: Right

## 2019-02-28 MED ORDER — ACETAMINOPHEN 325 MG PO TABS: 650.0000 mg | ORAL_TABLET | Freq: Once | ORAL | Status: DC | PRN

## 2019-02-28 MED ORDER — LIDOCAINE HCL (PF) 2 % IJ SOLN
INTRAOCULAR | Status: DC | PRN
  Administered 2019-02-28: 1 mL

## 2019-02-28 MED ORDER — TETRACAINE HCL 0.5 % OP SOLN
1.0000 [drp] | OPHTHALMIC | Status: DC | PRN
  Administered 2019-02-28 (×3): 1 [drp] via OPHTHALMIC

## 2019-02-28 MED ORDER — FENTANYL CITRATE (PF) 100 MCG/2ML IJ SOLN
INTRAMUSCULAR | Status: DC | PRN
  Administered 2019-02-28: 50 ug via INTRAVENOUS

## 2019-02-28 MED ORDER — CEFUROXIME OPHTHALMIC INJECTION 1 MG/0.1 ML
INJECTION | OPHTHALMIC | Status: DC | PRN
  Administered 2019-02-28: 0.1 mL via INTRACAMERAL

## 2019-02-28 MED ORDER — ACETAMINOPHEN 160 MG/5ML PO SOLN: 325.0000 mg | ORAL | Status: DC | PRN

## 2019-02-28 MED ORDER — MOXIFLOXACIN HCL 0.5 % OP SOLN
1.0000 [drp] | OPHTHALMIC | Status: DC | PRN
  Administered 2019-02-28 (×3): 1 [drp] via OPHTHALMIC

## 2019-02-28 MED ORDER — ARMC OPHTHALMIC DILATING DROPS
1.0000 "application " | OPHTHALMIC | Status: DC | PRN
  Administered 2019-02-28 (×3): 1 via OPHTHALMIC

## 2019-02-28 MED ORDER — NA HYALUR & NA CHOND-NA HYALUR 0.4-0.35 ML IO KIT
PACK | INTRAOCULAR | Status: DC | PRN
  Administered 2019-02-28: 1 mL via INTRAOCULAR

## 2019-02-28 MED ORDER — MIDAZOLAM HCL 2 MG/2ML IJ SOLN
INTRAMUSCULAR | Status: DC | PRN
  Administered 2019-02-28 (×2): 1 mg via INTRAVENOUS

## 2019-02-28 MED ORDER — BRIMONIDINE TARTRATE-TIMOLOL 0.2-0.5 % OP SOLN
OPHTHALMIC | Status: DC | PRN
  Administered 2019-02-28: 1 [drp] via OPHTHALMIC

## 2019-02-28 MED ORDER — EPINEPHRINE PF 1 MG/ML IJ SOLN
INTRAOCULAR | Status: DC | PRN
  Administered 2019-02-28: 44 mL via OPHTHALMIC

## 2019-02-28 MED ORDER — ONDANSETRON HCL 4 MG/2ML IJ SOLN: 4.0000 mg | Freq: Once | INTRAMUSCULAR | Status: DC | PRN

## 2019-02-28 SURGICAL SUPPLY — 27 items
CANNULA ANT/CHMB 27G (MISCELLANEOUS) ×1 IMPLANT
CANNULA ANT/CHMB 27GA (MISCELLANEOUS) ×3 IMPLANT
GLOVE SURG LX 7.5 STRW (GLOVE) ×4
GLOVE SURG LX STRL 7.5 STRW (GLOVE) ×1 IMPLANT
GLOVE SURG TRIUMPH 8.0 PF LTX (GLOVE) ×3 IMPLANT
GOWN STRL REUS W/ TWL LRG LVL3 (GOWN DISPOSABLE) ×2 IMPLANT
GOWN STRL REUS W/TWL LRG LVL3 (GOWN DISPOSABLE) ×4
LENS IOL ACRSF IQ ULTRA 23.5 (Intraocular Lens) IMPLANT
LENS IOL ACRYSOF IQ 23.5 (Intraocular Lens) ×3 IMPLANT
MARKER SKIN DUAL TIP RULER LAB (MISCELLANEOUS) ×3 IMPLANT
NDL FILTER BLUNT 18X1 1/2 (NEEDLE) ×1 IMPLANT
NDL RETROBULBAR .5 NSTRL (NEEDLE) IMPLANT
NEEDLE FILTER BLUNT 18X 1/2SAF (NEEDLE) ×2
NEEDLE FILTER BLUNT 18X1 1/2 (NEEDLE) ×1 IMPLANT
PACK CATARACT BRASINGTON (MISCELLANEOUS) ×3 IMPLANT
PACK EYE AFTER SURG (MISCELLANEOUS) ×3 IMPLANT
PACK OPTHALMIC (MISCELLANEOUS) ×3 IMPLANT
RING MALYGIN 7.0 (MISCELLANEOUS) ×2 IMPLANT
SUT ETHILON 10-0 CS-B-6CS-B-6 (SUTURE)
SUT VICRYL  9 0 (SUTURE)
SUT VICRYL 9 0 (SUTURE) IMPLANT
SUTURE EHLN 10-0 CS-B-6CS-B-6 (SUTURE) IMPLANT
SYR 3ML LL SCALE MARK (SYRINGE) ×3 IMPLANT
SYR 5ML LL (SYRINGE) ×3 IMPLANT
SYR TB 1ML LUER SLIP (SYRINGE) ×3 IMPLANT
WATER STERILE IRR 500ML POUR (IV SOLUTION) ×3 IMPLANT
WIPE NON LINTING 3.25X3.25 (MISCELLANEOUS) ×3 IMPLANT

## 2019-02-28 NOTE — Anesthesia Postprocedure Evaluation (Signed)
Anesthesia Post Note  Patient: Howard Anderson  Procedure(s) Performed: CATARACT EXTRACTION PHACO AND INTRAOCULAR LENS PLACEMENT ([place]) RIGHT MALYUGIN (Right Eye)  Patient location during evaluation: PACU Anesthesia Type: MAC Level of consciousness: awake and alert, oriented and patient cooperative Pain management: pain level controlled Vital Signs Assessment: post-procedure vital signs reviewed and stable Respiratory status: spontaneous breathing, nonlabored ventilation and respiratory function stable Cardiovascular status: blood pressure returned to baseline and stable Postop Assessment: adequate PO intake Anesthetic complications: no    Darrin Nipper

## 2019-02-28 NOTE — Anesthesia Procedure Notes (Signed)
Procedure Name: MAC Date/Time: 02/28/2019 12:00 PM Performed by: Cameron Ali, CRNA Pre-anesthesia Checklist: Patient identified, Emergency Drugs available, Suction available, Timeout performed and Patient being monitored Patient Re-evaluated:Patient Re-evaluated prior to induction Oxygen Delivery Method: Nasal cannula Placement Confirmation: positive ETCO2

## 2019-02-28 NOTE — Op Note (Signed)
OPERATIVE NOTE  Howard Anderson [id number] 02/28/2019   PREOPERATIVE DIAGNOSIS:    Nuclear Sclerotic Cataract Right eye with miotic pupil.        H25.11  POSTOPERATIVE DIAGNOSIS: Nuclear Sclerotic Cataract Right eye with miotic pupil.          PROCEDURE:  Phacoemusification with posterior chamber intraocular lens placement of the right eye which required pupil stretching with the Malyugin pupil expansion device.  LENS:   Implant Name Type Inv. Item Serial No. Manufacturer Lot No. LRB No. Used Action  LENS IOL ACRYSOF IQ 23.5 - [id number] Intraocular Lens LENS IOL ACRYSOF IQ 23.5 [id number] ALCON  Right 1 Implanted       ULTRASOUND TIME: 19 % of 0 minutes 47 seconds, CDE 9.2  SURGEON:  Wyonia Hough, MD   ANESTHESIA:  Topical with tetracaine drops and 2% Xylocaine jelly, augmented with 1% preservative-free intracameral lidocaine.   COMPLICATIONS:  None.   DESCRIPTION OF PROCEDURE:  The patient was identified in the holding room and transported to the operating room and placed in the supine position under the operating microscope. Theright eye was identified as the operative eye and it was prepped and draped in the usual sterile ophthalmic fashion.   A 1 millimeter clear-corneal paracentesis was made at the 12:00 position.  0.5 ml of preservative-free 1% lidocaine was injected into the anterior chamber. The anterior chamber was filled with Viscoat viscoelastic.  A 2.4 millimeter keratome was used to make a near-clear corneal incision at the 9:00 position. A Malyugin pupil expander was then placed through the main incision and into the anterior chamber of the eye.  The edge of the iris was secured on the lip of the pupil expander and it was released, thereby expanding the pupil to approximately 7 millimeters for completion of the cataract surgery.  Additional Viscoat was placed in the anterior chamber.  A cystotome and capsulorrhexis forceps were used to make a curvilinear  capsulorrhexis.   Balanced salt solution was used to hydrodissect and hydrodelineate the lens nucleus.   Phacoemulsification was used in stop and chop fashion to remove the lens, nucleus and epinucleus.  The remaining cortex was aspirated using the irrigation aspiration handpiece.  Additional Provisc was placed into the eye to distend the capsular bag for lens placement.  A lens was then injected into the capsular bag.  The pupil expanding ring was removed using a Kuglen hook and insertion device. The remaining viscoelastic was aspirated from the capsular bag and the anterior chamber.  The anterior chamber was filled with balanced salt solution to inflate to a physiologic pressure.  Wounds were hydrated with balanced salt solution.  The anterior chamber was inflated to a physiologic pressure with balanced salt solution.  No wound leaks were noted.Cefuroxime 0.1 ml of a 10mg /ml solution was injected into the anterior chamber for a dose of 1 mg of intracameral antibiotic at the completion of the case. Timolol and Brimonidine drops were applied to the eye.  The patient was taken to the recovery room in stable condition without complications of anesthesia or surgery.  Howard Anderson 02/28/2019, 12:21 PM

## 2019-02-28 NOTE — H&P (Signed)

## 2019-02-28 NOTE — Transfer of Care (Signed)
Immediate Anesthesia Transfer of Care Note  Patient: Howard Anderson  Procedure(s) Performed: CATARACT EXTRACTION PHACO AND INTRAOCULAR LENS PLACEMENT ([facility]) RIGHT MALYUGIN (Right Eye)  Patient Location: PACU  Anesthesia Type: MAC  Level of Consciousness: awake, alert  and patient cooperative  Airway and Oxygen Therapy: Patient Spontanous Breathing and Patient connected to supplemental oxygen  Post-op Assessment: Post-op Vital signs reviewed, Patient's Cardiovascular Status Stable, Respiratory Function Stable, Patent Airway and No signs of Nausea or vomiting  Post-op Vital Signs: Reviewed and stable  Complications: No apparent anesthesia complications

## 2019-03-01 ENCOUNTER — Encounter: Payer: Self-pay | Admitting: Ophthalmology

## 2019-03-22 DIAGNOSIS — G20 Parkinson's disease: Secondary | ICD-10-CM | POA: Diagnosis not present

## 2019-04-20 ENCOUNTER — Other Ambulatory Visit: Payer: Self-pay

## 2019-04-23 ENCOUNTER — Inpatient Hospital Stay: Payer: Medicare Other | Attending: Radiation Oncology

## 2019-04-23 ENCOUNTER — Other Ambulatory Visit: Payer: Self-pay

## 2019-04-23 DIAGNOSIS — C61 Malignant neoplasm of prostate: Secondary | ICD-10-CM | POA: Diagnosis not present

## 2019-04-23 DIAGNOSIS — Z923 Personal history of irradiation: Secondary | ICD-10-CM | POA: Insufficient documentation

## 2019-04-23 LAB — PSA: Prostatic Specific Antigen: 0.6 ng/mL (ref 0.00–4.00)

## 2019-04-30 ENCOUNTER — Other Ambulatory Visit: Payer: Self-pay

## 2019-04-30 ENCOUNTER — Encounter: Payer: Self-pay | Admitting: Radiation Oncology

## 2019-04-30 ENCOUNTER — Ambulatory Visit
Admission: RE | Admit: 2019-04-30 | Discharge: 2019-04-30 | Disposition: A | Discharge status: Home / Self Care | Payer: Medicare Other | Source: Ambulatory Visit | Attending: Radiation Oncology | Admitting: Radiation Oncology

## 2019-04-30 VITALS — BP 133/82 | HR 86 | Temp 96.9°F | Resp 18 | Wt 201.5 lb

## 2019-04-30 DIAGNOSIS — R3915 Urgency of urination: Secondary | ICD-10-CM | POA: Insufficient documentation

## 2019-04-30 DIAGNOSIS — R351 Nocturia: Secondary | ICD-10-CM | POA: Diagnosis not present

## 2019-04-30 DIAGNOSIS — Z923 Personal history of irradiation: Secondary | ICD-10-CM | POA: Insufficient documentation

## 2019-04-30 DIAGNOSIS — C61 Malignant neoplasm of prostate: Secondary | ICD-10-CM | POA: Insufficient documentation

## 2019-04-30 NOTE — Progress Notes (Signed)
Radiation Oncology Follow up Note  Name: Howard Anderson   Date:   04/30/2019 MRN:  [id number] DOB: 1945/09/22    This 73 y.o. male presents to the clinic today for 3-1/2-year follow-up status post IMRT radiation therapy for Gleason 7 adenocarcinoma of the prostate.  REFERRING PROVIDER: Juline Patch, MD  HPI: Patient is a 73 year old male now out 3-1/2 years having completed image guided IMRT radiation therapy for Gleason 7 (3+4) adenocarcinoma the prostate.  Seen today in routine follow-up he is doing well he states his bowels have never worked better.  No specific diarrhea.  He does have some urgency of urination and nocturia x2.  Most recent PSA is 0.6.  Absolutely stable over the past 2 years  COMPLICATIONS OF TREATMENT: none  FOLLOW UP COMPLIANCE: keeps appointments   PHYSICAL EXAM:  BP 133/82   Pulse 86   Temp (!) 96.9 F (36.1 C)   Resp 18   Wt 201 lb 8 oz (91.4 kg)   BMI 31.56 kg/m  Well-developed well-nourished patient in NAD. HEENT reveals PERLA, EOMI, discs not visualized.  Oral cavity is clear. No oral mucosal lesions are identified. Neck is clear without evidence of cervical or supraclavicular adenopathy. Lungs are clear to A&P. Cardiac examination is essentially unremarkable with regular rate and rhythm without murmur rub or thrill. Abdomen is benign with no organomegaly or masses noted. Motor sensory and DTR levels are equal and symmetric in the upper and lower extremities. Cranial nerves II through XII are grossly intact. Proprioception is intact. No peripheral adenopathy or edema is identified. No motor or sensory levels are noted. Crude visual fields are within normal range.  RADIOLOGY RESULTS: No current films for review  PLAN: Present time patient is 3 and half years out under excellent biochemical control of his prostate cancer.  I am pleased with his overall progress.  Of asked to see him back in 1 year for follow-up with a PSA prior to his visit.  Patient knows  to call with any concerns at any time.  I would like to take this opportunity to thank you for allowing me to participate in the care of your patient.Noreene Filbert, MD

## 2019-05-21 DIAGNOSIS — R0602 Shortness of breath: Secondary | ICD-10-CM | POA: Diagnosis not present

## 2019-05-21 DIAGNOSIS — I10 Essential (primary) hypertension: Secondary | ICD-10-CM | POA: Diagnosis not present

## 2019-05-21 DIAGNOSIS — E782 Mixed hyperlipidemia: Secondary | ICD-10-CM | POA: Diagnosis not present

## 2019-05-21 DIAGNOSIS — R600 Localized edema: Secondary | ICD-10-CM | POA: Diagnosis not present

## 2019-05-21 DIAGNOSIS — I714 Abdominal aortic aneurysm, without rupture: Secondary | ICD-10-CM | POA: Diagnosis not present

## 2019-06-03 ENCOUNTER — Other Ambulatory Visit: Payer: Self-pay | Admitting: Family Medicine

## 2019-06-03 DIAGNOSIS — E782 Mixed hyperlipidemia: Secondary | ICD-10-CM

## 2019-06-12 ENCOUNTER — Other Ambulatory Visit: Payer: Self-pay

## 2019-06-12 ENCOUNTER — Ambulatory Visit (INDEPENDENT_AMBULATORY_CARE_PROVIDER_SITE_OTHER): Payer: Medicare Other | Admitting: Family Medicine

## 2019-06-12 ENCOUNTER — Encounter: Payer: Self-pay | Admitting: Family Medicine

## 2019-06-12 VITALS — BP 122/62 | HR 64 | Ht 67.0 in | Wt 199.0 lb

## 2019-06-12 DIAGNOSIS — E782 Mixed hyperlipidemia: Secondary | ICD-10-CM | POA: Diagnosis not present

## 2019-06-12 DIAGNOSIS — K219 Gastro-esophageal reflux disease without esophagitis: Secondary | ICD-10-CM

## 2019-06-12 DIAGNOSIS — F329 Major depressive disorder, single episode, unspecified: Secondary | ICD-10-CM | POA: Diagnosis not present

## 2019-06-12 DIAGNOSIS — Z23 Encounter for immunization: Secondary | ICD-10-CM

## 2019-06-12 DIAGNOSIS — R69 Illness, unspecified: Secondary | ICD-10-CM

## 2019-06-12 MED ORDER — TRAZODONE HCL 50 MG PO TABS: 50.0000 mg | ORAL_TABLET | Freq: Every day | ORAL | 5 refills | Status: DC

## 2019-06-12 MED ORDER — SERTRALINE HCL 50 MG PO TABS: 50.0000 mg | ORAL_TABLET | Freq: Every day | ORAL | 1 refills | Status: DC

## 2019-06-12 MED ORDER — FAMOTIDINE 40 MG PO TABS: 40.0000 mg | ORAL_TABLET | Freq: Every day | ORAL | 1 refills | Status: DC

## 2019-06-12 MED ORDER — ATORVASTATIN CALCIUM 10 MG PO TABS: ORAL_TABLET | ORAL | 0 refills | Status: DC

## 2019-06-12 NOTE — Progress Notes (Signed)
Date:  06/12/2019   Name:  Howard Anderson   DOB:  04-10-1946   MRN:  [id number]   Chief Complaint: Hyperlipidemia, Gastroesophageal Reflux, Depression (PHQ9=3 and GAD7=1), Insomnia (takes trazodone), Flu Vaccine, and pneum vacc need (23)  Hyperlipidemia This is a chronic problem. The current episode started more than 1 year ago. The problem is controlled. Recent lipid tests were reviewed and are normal. He has no history of chronic renal disease, diabetes, hypothyroidism, liver disease or obesity. Factors aggravating his hyperlipidemia include thiazides. Pertinent negatives include no chest pain, focal sensory loss, focal weakness, leg pain, myalgias or shortness of breath. Current antihyperlipidemic treatment includes statins. The current treatment provides moderate improvement of lipids. There are no compliance problems.  Risk factors for coronary artery disease include dyslipidemia and hypertension.  Gastroesophageal Reflux He reports no abdominal pain, no belching, no chest pain, no choking, no coughing, no dysphagia, no early satiety, no globus sensation, no heartburn, no hoarse voice, no nausea, no sore throat, no stridor, no tooth decay, no water brash or no wheezing. This is a chronic problem. The current episode started more than 1 year ago. The problem occurs constantly. The problem has been unchanged. The symptoms are aggravated by certain foods. Pertinent negatives include no anemia, fatigue, melena, muscle weakness, orthopnea or weight loss. There are no known risk factors. He has tried a PPI for the symptoms. The treatment provided moderate relief.  Depression        This is a recurrent problem.  The current episode started more than 1 year ago.   The onset quality is gradual.   The problem occurs intermittently.  Associated symptoms include insomnia.  Associated symptoms include no decreased concentration, no fatigue, no helplessness, no hopelessness, not irritable, no restlessness, no  decreased interest, no appetite change, no body aches, no myalgias, no headaches, no indigestion, not sad and no suicidal ideas.  Past treatments include SSRIs - Selective serotonin reuptake inhibitors.   Pertinent negatives include no hypothyroidism. Insomnia Primary symptoms: difficulty falling asleep.  The problem occurs intermittently. PMH includes: depression.    Lab Results  Component Value Date   CREATININE 1.00 09/27/2016   BUN 18 09/27/2016   NA 140 09/27/2016   K 4.8 09/27/2016   CL 102 09/27/2016   CO2 23 09/27/2016   Lab Results  Component Value Date   CHOL 102 12/08/2017   HDL 44 12/08/2017   LDLCALC 45 12/08/2017   TRIG 67 12/08/2017   CHOLHDL 2.3 12/08/2017   No results found for: TSH No results found for: HGBA1C   Review of Systems  Constitutional: Negative for appetite change, chills, fatigue, fever and weight loss.  HENT: Negative for drooling, ear discharge, ear pain, hoarse voice, rhinorrhea and sore throat.   Respiratory: Negative for cough, choking, shortness of breath and wheezing.   Cardiovascular: Negative for chest pain, palpitations and leg swelling.  Gastrointestinal: Negative for abdominal pain, blood in stool, constipation, diarrhea, dysphagia, heartburn, melena and nausea.  Endocrine: Negative for polydipsia.  Genitourinary: Negative for dysuria, frequency, hematuria and urgency.  Musculoskeletal: Negative for back pain, myalgias, muscle weakness and neck pain.  Skin: Negative for rash.  Allergic/Immunologic: Negative for environmental allergies.  Neurological: Negative for dizziness, focal weakness and headaches.  Hematological: Does not bruise/bleed easily.  Psychiatric/Behavioral: Positive for depression. Negative for decreased concentration and suicidal ideas. The patient has insomnia. The patient is not nervous/anxious.     Patient Active Problem List   Diagnosis Date Noted  .  Shoulder pain 08/11/2017  . Urinary urgency 08/11/2017  .  Venous insufficiency of both lower extremities 10/25/2016  . Bilateral leg edema 10/19/2016  . Prostate cancer ([place]) 09/27/2016  . Essential hypertension 10/29/2015  . Hyperlipidemia 10/29/2015  . Esophageal reflux 10/29/2015  . Centrilobular emphysema ([place]) 10/29/2015  . Labyrinthitis 10/29/2015  . Microhematuria 09/29/2015  . Femoral artery aneurysm, left ([place]) 06/22/2015  . Cerebral vascular disease 06/21/2015  . AAA (abdominal aortic aneurysm) ([place]) 05/21/2015  . Degenerative disc disease, lumbar 04/24/2015  . Urinary incontinence, male, stress 04/24/2015  . Dizziness 01/20/2015  . Endocarditis 01/20/2015  . Aneurysm of abdominal vessel ([place]) 08/30/2014  . Chronic obstructive pulmonary disease ([place]) 08/30/2014  . Personal history of transient ischemic attack (TIA), and cerebral infarction without residual deficits 08/30/2014    No Known Allergies  Past Surgical History:  Procedure Laterality Date  . CATARACT EXTRACTION W/PHACO Left 09/27/2018   Procedure: CATARACT EXTRACTION PHACO AND INTRAOCULAR LENS PLACEMENT ([place]) LEFT;  Surgeon: Leandrew Koyanagi, MD;  Location: [facility];  Service: Ophthalmology;  Laterality: Left;  [place]  00:42 AP% 15.1 CDE 5.16 Fluid pack lot # [id number]  . CATARACT EXTRACTION W/PHACO Right 02/28/2019   Procedure: CATARACT EXTRACTION PHACO AND INTRAOCULAR LENS PLACEMENT ([place]) RIGHT MALYUGIN;  Surgeon: Leandrew Koyanagi, MD;  Location: [facility];  Service: Ophthalmology;  Laterality: Right;  . COLONOSCOPY  2010  . EMBOLECTOMY Left 06/20/2015   Procedure: EMBOLECTOMY;  Surgeon: [name], MD;  Location: [facility];  Service: Vascular;  Laterality: Left;  brachial thrombectomy  . ENDARTERECTOMY FEMORAL Left 06/20/2015   Procedure: ENDARTERECTOMY FEMORAL/FEMORAL PSEUDOANEURYSM repair;  Surgeon: [name], MD;  Location: [facility];  Service: Vascular;  Laterality: Left;  femerol  . FRACTURE SURGERY Right    SHOULDER  . INNER EAR  SURGERY    . MANDIBLE SURGERY     TUMOR  . PERIPHERAL VASCULAR CATHETERIZATION N/A 05/21/2015   Procedure: Endovascular Repair/Stent Graft;  Surgeon: [name], MD;  Location: [place] [facility];  Service: Cardiovascular;  Laterality: N/A;  . PERIPHERAL VASCULAR CATHETERIZATION  06/20/2015   Procedure: Lower Extremity Angiography;  Surgeon: [name], MD;  Location: [place] [facility];  Service: Cardiovascular;;  . PERIPHERAL VASCULAR CATHETERIZATION  06/20/2015   Procedure: Lower Extremity Intervention;  Surgeon: [name], MD;  Location: [place] [facility];  Service: Cardiovascular;;    Social History   Tobacco Use  . Smoking status: Former Smoker    Packs/day: 1.50    Years: 48.00    Pack years: 72.00    Quit date: 10/10/2013    Years since quitting: 5.6  . Smokeless tobacco: Never Used  Substance Use Topics  . Alcohol use: No  . Drug use: No     Medication list has been reviewed and updated.  Current Meds  Medication Sig  . aspirin 81 MG tablet Take 1 tablet (81 mg total) by mouth daily.  [name] atorvastatin (LIPITOR) 10 MG tablet TAKE ONE TABLET BY MOUTH EVERY DAY  . brimonidine-timolol (COMBIGAN) 0.2-0.5 % ophthalmic solution Place 1 drop into both eyes every 12 (twelve) hours.  . carbidopa-levodopa (SINEMET IR) 25-100 MG tablet Take 1.5 tablets by mouth 3 (three) times daily. Dr Manuella Ghazi  . Cholecalciferol (VITAMIN D3) 1000 UNITS CAPS Take 1,000 Units by mouth daily.   . dorzolamide (TRUSOPT) 2 % ophthalmic solution Place 1 drop into both eyes 2 (two) times daily.   [name] doxycycline (PERIOSTAT) 20 MG tablet Take 20 mg by  mouth 2 (two) times daily. dentist  . famotidine (PEPCID) 40 MG tablet Take 1 tablet (40 mg total) by mouth daily.  [name] latanoprost (XALATAN) 0.005 % ophthalmic solution Place 1 drop into both eyes at bedtime.   . Multiple Vitamins-Minerals (PRESERVISION AREDS PO) Take 1 capsule by mouth 2 (two) times daily.  . sertraline (ZOLOFT) 50 MG  tablet Take 1 tablet (50 mg total) by mouth daily.  . tamsulosin (FLOMAX) 0.4 MG CAPS capsule Take 1 capsule (0.4 mg total) by mouth daily.  . traZODone (DESYREL) 50 MG tablet Take 50 mg by mouth at bedtime.  . valsartan (DIOVAN) 80 MG tablet TAKE 1 TABLET (80 MG TOTAL) BY MOUTH ONCE DAILY. (Patient taking differently: Take 80 mg by mouth daily. TAKE 1 TABLET (80 MG TOTAL) BY MOUTH ONCE DAILY./ cardio)  . vitamin B-12 (CYANOCOBALAMIN) 1000 MCG tablet Take 1,000 mcg by mouth daily.    PHQ 2/9 Scores 06/12/2019 11/17/2018 12/08/2017 09/08/2016  PHQ - 2 Score 3 1 0 0  PHQ- 9 Score 3 2 - -    BP Readings from Last 3 Encounters:  06/12/19 122/62  04/30/19 133/82  02/28/19 102/68    Physical Exam Vitals signs and nursing note reviewed.  Constitutional:      General: He is not irritable. HENT:     Head: Normocephalic.     Right Ear: Tympanic membrane, ear canal and external ear normal.     Left Ear: Tympanic membrane, ear canal and external ear normal.     Nose: Nose normal.     Mouth/Throat:     Mouth: Mucous membranes are moist.  Eyes:     General: No scleral icterus.       Right eye: No discharge.        Left eye: No discharge.     Conjunctiva/sclera: Conjunctivae normal.     Pupils: Pupils are equal, round, and reactive to light.  Neck:     Musculoskeletal: Normal range of motion and neck supple.     Thyroid: No thyromegaly.     Vascular: No JVD.     Trachea: No tracheal deviation.  Cardiovascular:     Rate and Rhythm: Normal rate and regular rhythm.     Chest Wall: PMI is not displaced.     Pulses: Normal pulses.     Heart sounds: Normal heart sounds. No murmur. No systolic murmur. No diastolic murmur. No friction rub. No gallop. No S3 or S4 sounds.   Pulmonary:     Effort: No respiratory distress.     Breath sounds: Normal breath sounds. No decreased breath sounds, wheezing, rhonchi or rales.  Abdominal:     General: Bowel sounds are normal.     Palpations: Abdomen is  soft. There is no mass.     Tenderness: There is no abdominal tenderness. There is no guarding or rebound.  Musculoskeletal: Normal range of motion.        General: No tenderness.     Right lower leg: No edema.     Left lower leg: No edema.  Lymphadenopathy:     Cervical: No cervical adenopathy.  Skin:    General: Skin is warm.     Capillary Refill: Capillary refill takes less than 2 seconds.     Findings: No rash.  Neurological:     Mental Status: He is alert and oriented to person, place, and time.     Cranial Nerves: No cranial nerve deficit.     Deep Tendon Reflexes: Reflexes  are normal and symmetric.     Wt Readings from Last 3 Encounters:  06/12/19 199 lb (90.3 kg)  04/30/19 201 lb 8 oz (91.4 kg)  02/26/19 204 lb (92.5 kg)    BP 122/62   Pulse 64   Ht 5\' 7"  (1.702 m)   Wt 199 lb (90.3 kg)   BMI 31.17 kg/m   Assessment and Plan: 1. Mixed hyperlipidemia Chronic.  Controlled.  Stable.  Will continue atorvastatin 10 mg once a day.  Will check lipid panel. - atorvastatin (LIPITOR) 10 MG tablet; TAKE ONE TABLET BY MOUTH EVERY DAY  Dispense: 90 tablet; Refill: 0 - Lipid Panel With LDL/HDL Ratio  2. Gastroesophageal reflux disease Chronic.  Controlled.  Stable.  Continue Pepcid 40 mg once a day. - famotidine (PEPCID) 40 MG tablet; Take 1 tablet (40 mg total) by mouth daily.  Dispense: 90 tablet; Refill: 1  3. Reactive depression Chronic.  Controlled.  Stable.  Patient doing very well with a PHQ score of 3 and a gad score of 1.  Is still despondent over the loss of spouse.  We will continue sertraline 50 mg once a day. - sertraline (ZOLOFT) 50 MG tablet; Take 1 tablet (50 mg total) by mouth daily.  Dispense: 90 tablet; Refill: 1  4. Taking multiple medications for chronic disease Patient is on a statin and we will check hepatic function panel for ruling out hepatotoxicity. - Lipid Panel With LDL/HDL Ratio - Hepatic function panel - COMPLETE METABOLIC PANEL WITH GFR   5. Need for pneumococcal vaccination Discussed and administered - Pneumococcal polysaccharide vaccine 23-valent greater than or equal to 2yo subcutaneous/IM  6. Influenza vaccine needed Discussed and administered - Flu Vaccine QUAD High Dose(Fluad)

## 2019-06-13 LAB — CMP14+EGFR
ALT: 8 IU/L (ref 0–44)
AST: 12 IU/L (ref 0–40)
Albumin/Globulin Ratio: 2.3 — ABNORMAL HIGH (ref 1.2–2.2)
Albumin: 4.5 g/dL (ref 3.7–4.7)
Alkaline Phosphatase: 58 IU/L (ref 39–117)
BUN/Creatinine Ratio: 26 — ABNORMAL HIGH (ref 10–24)
BUN: 26 mg/dL (ref 8–27)
Bilirubin Total: 0.6 mg/dL (ref 0.0–1.2)
CO2: 26 mmol/L (ref 20–29)
Calcium: 9.4 mg/dL (ref 8.6–10.2)
Chloride: 103 mmol/L (ref 96–106)
Creatinine, Ser: 1 mg/dL (ref 0.76–1.27)
GFR calc Af Amer: 86 mL/min/{1.73_m2} (ref >59)
GFR calc non Af Amer: 74 mL/min/{1.73_m2} (ref >59)
Globulin, Total: 2 g/dL (ref 1.5–4.5)
Glucose: 131 mg/dL — ABNORMAL HIGH (ref 65–99)
Potassium: 5.5 mmol/L — ABNORMAL HIGH (ref 3.5–5.2)
Sodium: 141 mmol/L (ref 134–144)
Total Protein: 6.5 g/dL (ref 6.0–8.5)

## 2019-06-13 LAB — LIPID PANEL WITH LDL/HDL RATIO
Cholesterol, Total: 97 mg/dL — ABNORMAL LOW (ref 100–199)
HDL: 42 mg/dL (ref >39)
LDL Chol Calc (NIH): 39 mg/dL (ref 0–99)
LDL/HDL Ratio: 0.9 ratio (ref 0.0–3.6)
Triglycerides: 74 mg/dL (ref 0–149)
VLDL Cholesterol Cal: 16 mg/dL (ref 5–40)

## 2019-06-13 LAB — HEPATIC FUNCTION PANEL: Bilirubin, Direct: 0.2 mg/dL (ref 0.00–0.40)

## 2019-10-09 ENCOUNTER — Ambulatory Visit: Payer: Medicare Other | Admitting: Urology

## 2019-10-10 ENCOUNTER — Other Ambulatory Visit: Payer: Self-pay

## 2019-10-10 ENCOUNTER — Ambulatory Visit (INDEPENDENT_AMBULATORY_CARE_PROVIDER_SITE_OTHER): Payer: Medicare Other | Admitting: Urology

## 2019-10-10 ENCOUNTER — Other Ambulatory Visit: Payer: Self-pay | Admitting: Family Medicine

## 2019-10-10 DIAGNOSIS — C61 Malignant neoplasm of prostate: Secondary | ICD-10-CM | POA: Diagnosis not present

## 2019-10-10 DIAGNOSIS — K219 Gastro-esophageal reflux disease without esophagitis: Secondary | ICD-10-CM

## 2019-10-10 NOTE — Progress Notes (Signed)
10/10/2019 2:35 PM   EDDIEL THAN March 17, 1946 [id number]  Referring provider: Juline Patch, MD [address] [place],  [place] [address]  Chief Complaint  Patient presents with  . Prostate Cancer    Urologic history: 1.  T1c intermediate risk adenocarcinoma the prostate -Prostate biopsy 10/2015; PSA 10.8; volume 58 cc -12/12 cores positive Gleason 3+4 adenocarcinoma -CT without adenopathy or extraprostatic pelvic disease -IMRT completed August 2017  2.  History microhematuria -Cystoscopy performed 10/2015 with prostatic enlargement/hypervascularity -No upper tract abnormalities CTU   HPI: 74 y.o. male presents for annual follow-up.  -Doing well and has no complaints today -No bothersome lower urinary tract symptoms -Denies dysuria, gross hematuria -No flank, abdominal or pelvic pain -Saw Dr. Baruch Gouty 04/2019; PSA stable 0.6   PMH: Past Medical History:  Diagnosis Date  . AAA (abdominal aortic aneurysm) without rupture ([place])   . AAA (abdominal aortic aneurysm) without rupture ([place])   . Cancer [facility])    prostate cancer  . Chest pain   . COPD (chronic obstructive pulmonary disease) ([place])   . Depression   . Dizziness and giddiness   . Dvt femoral (deep venous thrombosis) (H[facility])    H/O LEG  . GERD (gastroesophageal reflux disease)   . Heart disease   . Hyperlipidemia   . Hypertension   . Parkinson disease ([place])   . Prostate cancer ([place])   . Shortness of breath dyspnea    DOE  . Shoulder fracture, right   . Stroke [facility])    tia    Surgical History: Past Surgical History:  Procedure Laterality Date  . CATARACT EXTRACTION W/PHACO Left 09/27/2018   Procedure: CATARACT EXTRACTION PHACO AND INTRAOCULAR LENS PLACEMENT ([place]) LEFT;  Surgeon: Leandrew Koyanagi, MD;  Location: [facility];  Service: Ophthalmology;  Laterality: Left;  [place]  00:42 AP% 15.1 CDE 5.16 Fluid pack lot # [id number]  . CATARACT EXTRACTION W/PHACO Right 02/28/2019   Procedure:  CATARACT EXTRACTION PHACO AND INTRAOCULAR LENS PLACEMENT ([place]) RIGHT MALYUGIN;  Surgeon: Leandrew Koyanagi, MD;  Location: [place];  Service: Ophthalmology;  Laterality: Right;  . COLONOSCOPY  2010  . EMBOLECTOMY Left 06/20/2015   Procedure: EMBOLECTOMY;  Surgeon: [name], MD;  Location: [facility];  Service: Vascular;  Laterality: Left;  brachial thrombectomy  . ENDARTERECTOMY FEMORAL Left 06/20/2015   Procedure: ENDARTERECTOMY FEMORAL/FEMORAL PSEUDOANEURYSM repair;  Surgeon: [name], MD;  Location: [facility];  Service: Vascular;  Laterality: Left;  femerol  . FRACTURE SURGERY Right    SHOULDER  . INNER EAR SURGERY    . MANDIBLE SURGERY     TUMOR  . PERIPHERAL VASCULAR CATHETERIZATION N/A 05/21/2015   Procedure: Endovascular Repair/Stent Graft;  Surgeon: [name], MD;  Location: [place] [facility];  Service: Cardiovascular;  Laterality: N/A;  . PERIPHERAL VASCULAR CATHETERIZATION  06/20/2015   Procedure: Lower Extremity Angiography;  Surgeon: [name], MD;  Location: [place] [facility];  Service: Cardiovascular;;  . PERIPHERAL VASCULAR CATHETERIZATION  06/20/2015   Procedure: Lower Extremity Intervention;  Surgeon: [name], MD;  Location: [place] [facility];  Service: Cardiovascular;;    Home Medications:  Allergies as of 10/10/2019   No Known Allergies     Medication List       Accurate as of October 10, 2019  2:35 PM. If you have any questions, ask your nurse or doctor.        aspirin 81 MG tablet Take 1 tablet (81 mg total) by  mouth daily.   atorvastatin 10 MG tablet Commonly known as: LIPITOR TAKE ONE TABLET BY MOUTH EVERY DAY   carbidopa-levodopa 25-100 MG tablet Commonly known as: SINEMET IR Take 1.5 tablets by mouth 3 (three) times daily. Dr Manuella Ghazi   Combigan 0.2-0.5 % ophthalmic solution Generic drug: brimonidine-timolol Place 1 drop into both eyes every 12 (twelve) hours.   dorzolamide 2 % ophthalmic  solution Commonly known as: TRUSOPT Place 1 drop into both eyes 2 (two) times daily.   doxycycline 20 MG tablet Commonly known as: PERIOSTAT Take 20 mg by mouth 2 (two) times daily. dentist   famotidine 40 MG tablet Commonly known as: PEPCID TAKE 1 TABLET BY MOUTH DAILY   latanoprost 0.005 % ophthalmic solution Commonly known as: XALATAN Place 1 drop into both eyes at bedtime.   PRESERVISION AREDS PO Take 1 capsule by mouth 2 (two) times daily.   sertraline 50 MG tablet Commonly known as: ZOLOFT Take 1 tablet (50 mg total) by mouth daily.   tamsulosin 0.4 MG Caps capsule Commonly known as: FLOMAX Take 1 capsule (0.4 mg total) by mouth daily.   traZODone 50 MG tablet Commonly known as: DESYREL Take 1 tablet (50 mg total) by mouth at bedtime.   valsartan 80 MG tablet Commonly known as: DIOVAN TAKE 1 TABLET (80 MG TOTAL) BY MOUTH ONCE DAILY. What changed:   how much to take  how to take this  when to take this  additional instructions   vitamin B-12 1000 MCG tablet Commonly known as: CYANOCOBALAMIN Take 1,000 mcg by mouth daily.   Vitamin D3 25 MCG (1000 UT) Caps Take 1,000 Units by mouth daily.       Allergies: No Known Allergies  Family History: Family History  Problem Relation Age of Onset  . Prostate cancer Brother   . Bladder Cancer Neg Hx   . Kidney cancer Neg Hx     Social History:  reports that he quit smoking about 6 years ago. He has a 72.00 pack-year smoking history. He has never used smokeless tobacco. He reports that he does not drink alcohol or use drugs.   Physical Exam: There were no vitals taken for this visit.  Constitutional:  Alert and oriented, No acute distress. HEENT: [place] AT, moist mucus membranes.  Trachea midline, no masses. Cardiovascular: No clubbing, cyanosis, or edema. Respiratory: Normal respiratory effort, no increased work of breathing. Neurologic: Grossly intact, no focal deficits, moving all 4  extremities. Psychiatric: Normal mood and affect.   Assessment & Plan:    - T1c intermediate risk prostate cancer Doing well status post IMRT.  PSA remains low and stable.  He has radiation oncology follow-up 04/2020 and will see him back in 1 year with a PSA.  PSA ordered today   Abbie Sons, MD  [facility] [address], [place], [place] [address] [phone]

## 2019-10-11 ENCOUNTER — Telehealth: Payer: Self-pay | Admitting: *Deleted

## 2019-10-11 LAB — PSA: Prostate Specific Ag, Serum: 1 ng/mL (ref 0.0–4.0)

## 2019-10-11 NOTE — Telephone Encounter (Signed)
-----   Message from Abbie Sons, MD sent at 10/11/2019  8:24 AM EST ----- PSA was slightly higher at 1.0.  He is scheduled for a follow-up in radiation oncology in September and PSA will be rechecked at that time

## 2019-10-12 ENCOUNTER — Encounter: Payer: Self-pay | Admitting: Urology

## 2019-10-12 NOTE — Telephone Encounter (Signed)
Notified patient as instructed, patient pleased. Discussed follow-up appointments, patient agrees  

## 2019-12-13 ENCOUNTER — Other Ambulatory Visit: Payer: Self-pay | Admitting: Urology

## 2019-12-13 ENCOUNTER — Other Ambulatory Visit: Payer: Self-pay | Admitting: Family Medicine

## 2019-12-13 DIAGNOSIS — F329 Major depressive disorder, single episode, unspecified: Secondary | ICD-10-CM

## 2019-12-13 DIAGNOSIS — E782 Mixed hyperlipidemia: Secondary | ICD-10-CM

## 2019-12-13 DIAGNOSIS — C61 Malignant neoplasm of prostate: Secondary | ICD-10-CM

## 2019-12-18 ENCOUNTER — Other Ambulatory Visit: Payer: Self-pay | Admitting: Family Medicine

## 2019-12-18 DIAGNOSIS — K219 Gastro-esophageal reflux disease without esophagitis: Secondary | ICD-10-CM

## 2019-12-18 NOTE — Telephone Encounter (Signed)
Requested Prescriptions  Pending Prescriptions Disp Refills  . famotidine (PEPCID) 40 MG tablet [Pharmacy Med Name: FAMOTIDINE 40 MG TAB] 90 tablet 1    Sig: TAKE 1 TABLET BY MOUTH DAILY     Gastroenterology:  H2 Antagonists Passed - 12/18/2019  6:59 PM      Passed - Valid encounter within last 12 months    Recent Outpatient Visits          6 months ago Taking multiple medications for chronic disease   [facility] Juline Patch, MD   1 year ago Reactive depression   [facility] Juline Patch, MD   1 year ago Encounter for medication review and counseling   [place], MD   2 years ago Mixed hyperlipidemia   [place], MD   3 years ago Abdominal aortic aneurysm (AAA) without rupture [facility])   [facility] Juline Patch, MD      Future Appointments            In 10 months [place], Ronda Fairly, MD [place]

## 2020-01-04 ENCOUNTER — Other Ambulatory Visit: Payer: Self-pay | Admitting: *Deleted

## 2020-01-04 DIAGNOSIS — C61 Malignant neoplasm of prostate: Secondary | ICD-10-CM

## 2020-01-04 NOTE — Telephone Encounter (Signed)
Patient is using total care

## 2020-01-21 ENCOUNTER — Other Ambulatory Visit: Payer: Self-pay

## 2020-01-21 ENCOUNTER — Encounter (INDEPENDENT_AMBULATORY_CARE_PROVIDER_SITE_OTHER): Payer: Self-pay | Admitting: Vascular Surgery

## 2020-01-21 ENCOUNTER — Ambulatory Visit (INDEPENDENT_AMBULATORY_CARE_PROVIDER_SITE_OTHER): Payer: Medicare Other | Admitting: Vascular Surgery

## 2020-01-21 ENCOUNTER — Ambulatory Visit (INDEPENDENT_AMBULATORY_CARE_PROVIDER_SITE_OTHER): Payer: Medicare Other

## 2020-01-21 VITALS — BP 110/72 | HR 74 | Resp 16 | Wt 210.0 lb

## 2020-01-21 DIAGNOSIS — I714 Abdominal aortic aneurysm, without rupture, unspecified: Secondary | ICD-10-CM

## 2020-01-21 DIAGNOSIS — I10 Essential (primary) hypertension: Secondary | ICD-10-CM

## 2020-01-21 DIAGNOSIS — I872 Venous insufficiency (chronic) (peripheral): Secondary | ICD-10-CM

## 2020-01-21 DIAGNOSIS — J439 Emphysema, unspecified: Secondary | ICD-10-CM | POA: Diagnosis not present

## 2020-01-21 DIAGNOSIS — E782 Mixed hyperlipidemia: Secondary | ICD-10-CM

## 2020-01-31 ENCOUNTER — Encounter (INDEPENDENT_AMBULATORY_CARE_PROVIDER_SITE_OTHER): Payer: Self-pay | Admitting: Vascular Surgery

## 2020-01-31 NOTE — Progress Notes (Signed)
MRN : [id number]  Howard Anderson is a 74 y.o. ([birth year]) male who presents with chief complaint of  Chief Complaint  Patient presents with  . Follow-up    ultrasound follow up  .  History of Present Illness:   The patient returns to the office for surveillance of an abdominal aortic aneurysm status post stent graft placement on 05/21/2015.   Patient denies abdominal pain or back pain, no other abdominal complaints. No groin related complaints. No symptoms consistent with distal embolization No changes in claudication distance.   The patient is also followed for leg swelling.  The swelling has improved quite a bit and the pain associated with swelling has decreased substantially. There have not been any interval development of a ulcerations or wounds.  Since the previous visit the patient has been wearing graduated compression stockings and has noted little significant improvement in the lymphedema. The patient has been using compression routinely morning until night.  There have been no interval changes in his overall healthcare since his last visit.   Patient denies amaurosis fugax or TIA symptoms. There is no history of claudication or rest pain symptoms of the lower extremities.   No surgery or intervention at this point in time.    Previous duplex ultrasound of the lower extremities shows normal deep venous system, superficial reflux was not present.   The patient denies angina or shortness of breath.   Duplex US of the aorta and iliac arteries shows a 4.17 AAA sac with no endoleak, significant decrease in the sac compared to the previous study.   Current Meds  Medication Sig  . aspirin 81 MG tablet Take 1 tablet (81 mg total) by mouth daily.  [name] atorvastatin (LIPITOR) 10 MG tablet TAKE 1 TABLET BY MOUTH DAILY  . brimonidine-timolol (COMBIGAN) 0.2-0.5 % ophthalmic solution Place 1 drop into both eyes every 12 (twelve) hours.  . carbidopa-levodopa (SINEMET IR)  25-100 MG tablet Take 1.5 tablets by mouth 3 (three) times daily. Dr Manuella Ghazi  . Cholecalciferol (VITAMIN D3) 1000 UNITS CAPS Take 1,000 Units by mouth daily.   . dorzolamide (TRUSOPT) 2 % ophthalmic solution Place 1 drop into both eyes 2 (two) times daily.   [name] doxycycline (PERIOSTAT) 20 MG tablet Take 20 mg by mouth 2 (two) times daily. dentist  . famotidine (PEPCID) 40 MG tablet TAKE 1 TABLET BY MOUTH DAILY  . latanoprost (XALATAN) 0.005 % ophthalmic solution Place 1 drop into both eyes at bedtime.   . Multiple Vitamins-Minerals (PRESERVISION AREDS PO) Take 1 capsule by mouth 2 (two) times daily.  . sertraline (ZOLOFT) 50 MG tablet TAKE 1 TABLET BY MOUTH DAILY  . tamsulosin (FLOMAX) 0.4 MG CAPS capsule TAKE 1 CAPSULE BY MOUTH ONCE DAILY  . traZODone (DESYREL) 50 MG tablet Take 1 tablet (50 mg total) by mouth at bedtime.  . valsartan (DIOVAN) 80 MG tablet TAKE 1 TABLET (80 MG TOTAL) BY MOUTH ONCE DAILY. (Patient taking differently: Take 80 mg by mouth daily. TAKE 1 TABLET (80 MG TOTAL) BY MOUTH ONCE DAILY./ cardio)  . vitamin B-12 (CYANOCOBALAMIN) 1000 MCG tablet Take 1,000 mcg by mouth daily.    Past Medical History:  Diagnosis Date  . AAA (abdominal aortic aneurysm) without rupture ([place])   . AAA (abdominal aortic aneurysm) without rupture ([place])   . Cancer [facility])    prostate cancer  . Chest pain   . COPD (chronic obstructive pulmonary disease) ([place])   . Depression   . Dizziness and giddiness   .  Dvt femoral (deep venous thrombosis) ([facility])    H/O LEG  . GERD (gastroesophageal reflux disease)   . Heart disease   . Hyperlipidemia   . Hypertension   . Parkinson disease ([place])   . Prostate cancer ([place])   . Shortness of breath dyspnea    DOE  . Shoulder fracture, right   . Stroke [facility])    tia    Past Surgical History:  Procedure Laterality Date  . CATARACT EXTRACTION W/PHACO Left 09/27/2018   Procedure: CATARACT EXTRACTION PHACO AND INTRAOCULAR LENS PLACEMENT ([place]) LEFT;  Surgeon:  Leandrew Koyanagi, MD;  Location: [facility];  Service: Ophthalmology;  Laterality: Left;  [place]  00:42 AP% 15.1 CDE 5.16 Fluid pack lot # [id number]  . CATARACT EXTRACTION W/PHACO Right 02/28/2019   Procedure: CATARACT EXTRACTION PHACO AND INTRAOCULAR LENS PLACEMENT ([place]) RIGHT MALYUGIN;  Surgeon: Leandrew Koyanagi, MD;  Location: [place];  Service: Ophthalmology;  Laterality: Right;  . COLONOSCOPY  2010  . EMBOLECTOMY Left 06/20/2015   Procedure: EMBOLECTOMY;  Surgeon: [name], MD;  Location: [facility];  Service: Vascular;  Laterality: Left;  brachial thrombectomy  . ENDARTERECTOMY FEMORAL Left 06/20/2015   Procedure: ENDARTERECTOMY FEMORAL/FEMORAL PSEUDOANEURYSM repair;  Surgeon: [name], MD;  Location: [facility];  Service: Vascular;  Laterality: Left;  femerol  . FRACTURE SURGERY Right    SHOULDER  . INNER EAR SURGERY    . MANDIBLE SURGERY     TUMOR  . PERIPHERAL VASCULAR CATHETERIZATION N/A 05/21/2015   Procedure: Endovascular Repair/Stent Graft;  Surgeon: [name], MD;  Location: [place] [facility];  Service: Cardiovascular;  Laterality: N/A;  . PERIPHERAL VASCULAR CATHETERIZATION  06/20/2015   Procedure: Lower Extremity Angiography;  Surgeon: [name], MD;  Location: [place] [facility];  Service: Cardiovascular;;  . PERIPHERAL VASCULAR CATHETERIZATION  06/20/2015   Procedure: Lower Extremity Intervention;  Surgeon: [name], MD;  Location: [place] [facility];  Service: Cardiovascular;;    Social History Social History   Tobacco Use  . Smoking status: Former Smoker    Packs/day: 1.50    Years: 48.00    Pack years: 72.00    Quit date: 10/10/2013    Years since quitting: 6.3  . Smokeless tobacco: Never Used  Vaping Use  . Vaping Use: Never used  Substance Use Topics  . Alcohol use: No  . Drug use: No    Family History Family History  Problem Relation Age of Onset  . Prostate cancer Brother   . Bladder Cancer  Neg Hx   . Kidney cancer Neg Hx     No Known Allergies   REVIEW OF SYSTEMS (Negative unless checked)  Constitutional: [] Weight loss  [] Fever  [] Chills Cardiac: [] Chest pain   [] Chest pressure   [] Palpitations   [] Shortness of breath when laying flat   [] Shortness of breath with exertion. Vascular:  [] Pain in legs with walking   [x] Pain in legs at rest  [] History of DVT   [] Phlebitis   [x] Swelling in legs   [] Varicose veins   [] Non-healing ulcers Pulmonary:   [] Uses home oxygen   [] Productive cough   [] Hemoptysis   [] Wheeze  [] COPD   [] Asthma Neurologic:  [] Dizziness   [] Seizures   [] History of stroke   [] History of TIA  [] Aphasia   [] Vissual changes   [] Weakness or numbness in arm   [] Weakness or numbness in leg Musculoskeletal:   [] Joint swelling   [] Joint pain   [] Low back pain Hematologic:  []   Easy bruising  [] Easy bleeding   [] Hypercoagulable state   [] Anemic Gastrointestinal:  [] Diarrhea   [] Vomiting  [] Gastroesophageal reflux/heartburn   [] Difficulty swallowing. Genitourinary:  [] Chronic kidney disease   [] Difficult urination  [] Frequent urination   [] Blood in urine Skin:  [] Rashes   [] Ulcers  Psychological:  [] History of anxiety   []  History of major depression.  Physical Examination  Vitals:   01/21/20 1134  BP: 110/72  Pulse: 74  Resp: 16  Weight: 210 lb (95.3 kg)   Body mass index is 32.89 kg/m. Gen: WD/WN, NAD Head: [place]/AT, No temporalis wasting.  Ear/Nose/Throat: Hearing grossly intact, nares w/o erythema or drainage Eyes: PER, EOMI, sclera nonicteric.  Neck: Supple, no large masses.   Pulmonary:  Good air movement, no audible wheezing bilaterally, no use of accessory muscles.  Cardiac: RRR, no JVD Vascular: scattered varicosities present bilaterally.  Moderate venous stasis changes to the legs bilaterally.  2+ soft pitting edema Vessel Right Left  Radial Palpable Palpable  Gastrointestinal: Non-distended. No guarding/no peritoneal signs.  Musculoskeletal: M/S 5/5  throughout.  No deformity or atrophy.  Neurologic: CN 2-12 intact. Symmetrical.  Speech is fluent. Motor exam as listed above. Psychiatric: Judgment intact, Mood & affect appropriate for pt's clinical situation. Dermatologic: No rashes or ulcers noted.  No changes consistent with cellulitis.  CBC Lab Results  Component Value Date   WBC 5.8 03/09/2016   HGB 14.6 03/09/2016   HCT 43.3 03/09/2016   MCV 88.2 03/09/2016   PLT 263 03/09/2016    BMET    Component Value Date/Time   NA 141 06/12/2019 1056   NA 139 08/02/2014 1128   K 5.5 (H) 06/12/2019 1056   K 4.2 08/02/2014 1128   CL 103 06/12/2019 1056   CL 106 08/02/2014 1128   CO2 26 06/12/2019 1056   CO2 25 08/02/2014 1128   GLUCOSE 131 (H) 06/12/2019 1056   GLUCOSE 129 (H) 01/01/2016 0526   GLUCOSE 175 (H) 08/02/2014 1128   BUN 26 06/12/2019 1056   BUN 16 08/02/2014 1128   CREATININE 1.00 06/12/2019 1056   CREATININE 0.74 08/03/2014 0830   CALCIUM 9.4 06/12/2019 1056   CALCIUM 7.9 (L) 08/02/2014 1128   GFRNONAA 74 06/12/2019 1056   GFRNONAA >60 08/03/2014 0830   GFRNONAA >60 10/18/2013 0502   GFRAA 86 06/12/2019 1056   GFRAA >60 08/03/2014 0830   GFRAA >60 10/18/2013 0502   CrCl cannot be calculated (Patient's most recent lab result is older than the maximum 21 days allowed.).  COAG Lab Results  Component Value Date   INR 0.98 05/08/2015    Radiology VAS [place] EVAR DUPLEX  Result Date: 01/21/2020 Endovascular Aortic Repair Study (EVAR) Indications: Follow up exam for EVAR.  Comparison Study: 01/18/2019 Performing Technologist: Almira Coaster RVS  Examination Guidelines: A complete evaluation includes B-mode imaging, spectral Doppler, color Doppler, and power Doppler as needed of all accessible portions of each vessel. Bilateral testing is considered an integral part of a complete examination. Limited examinations for reoccurring indications may be performed as noted.  Endovascular Aortic Repair (EVAR):  +----------+----------------+-------------------+-------------------+           Diameter AP (cm)Diameter Trans (cm)Velocities (cm/sec) +----------+----------------+-------------------+-------------------+ Aorta     3.77            4.17               67                  +----------+----------------+-------------------+-------------------+ Right Limb1.36  1.48               38                  +----------+----------------+-------------------+-------------------+ Left Limb 1.45            1.41               63                  +----------+----------------+-------------------+-------------------+  Summary: Abdominal Aorta: Patent endovascular aneurysm repair with no evidence of endoleak. In comparison to Prior study on 01/18/2019; EVAR measurements appear to be decreased from 6.3cms to 4.17cms today.  *See table(s) above for measurements and observations.  Electronically signed by Hortencia Pilar MD on 01/21/2020 at 4:45:50 PM.   Final      Assessment/Plan 1. Abdominal aortic aneurysm (AAA) without rupture (HCC) Recommend: Patient is status post successful endovascular repair of the AAA.   No further intervention is required at this time.   No endoleak is detected and the aneurysm sac is stable.  The patient will continue antiplatelet therapy as prescribed as well as aggressive management of hyperlipidemia. Exercise is again strongly encouraged.   However, endografts require continued surveillance with ultrasound or CT scan. This is mandatory to detect any changes that allow repressurization of the aneurysm sac.  The patient is informed that this would be asymptomatic.  The patient is reminded that lifelong routine surveillance is a necessity with an endograft. Patient will continue to follow-up at 6 month intervals with ultrasound of the aorta. - VAS US AORTA/IVC/ILIACS; Future  2. Venous insufficiency of both lower extremities No surgery or intervention at this point in  time.  I have reviewed my discussion with the patient regarding venous insufficiency and why it causes symptoms. I have discussed with the patient the chronic skin changes that accompany venous insufficiency and the long term sequela such as ulceration. Patient will contnue wearing graduated compression stockings on a daily basis, as this has provided excellent control of his edema. The patient will put the stockings on first thing in the morning and removing them in the evening. The patient is reminded not to sleep in the stockings.  In addition, behavioral modification including elevation during the day will be initiated. Exercise is strongly encouraged.  Given the patient's good control and lack of any problems regarding the venous insufficiency and lymphedema a lymph pump in not need at this time.    3. Essential hypertension Continue antihypertensive medications as already ordered, these medications have been reviewed and there are no changes at this time.   4. Pulmonary emphysema, unspecified emphysema type ([place]) Continue pulmonary medications and aerosols as already ordered, these medications have been reviewed and there are no changes at this time.    5. Mixed hyperlipidemia Continue statin as ordered and reviewed, no changes at this time    Hortencia Pilar, MD  01/31/2020 1:15 PM

## 2020-02-08 ENCOUNTER — Other Ambulatory Visit: Payer: Self-pay | Admitting: Family Medicine

## 2020-02-08 DIAGNOSIS — F329 Major depressive disorder, single episode, unspecified: Secondary | ICD-10-CM

## 2020-02-08 NOTE — Telephone Encounter (Signed)
Patient called and appt scheduled on 02/11/20. Courtesy refill provided.

## 2020-02-11 ENCOUNTER — Ambulatory Visit
Admission: RE | Admit: 2020-02-11 | Discharge: 2020-02-11 | Disposition: A | Discharge status: Home / Self Care | Payer: Medicare Other | Attending: Family Medicine | Admitting: Family Medicine

## 2020-02-11 ENCOUNTER — Ambulatory Visit
Admission: RE | Admit: 2020-02-11 | Discharge: 2020-02-11 | Disposition: A | Discharge status: Home / Self Care | Payer: Medicare Other | Source: Ambulatory Visit | Attending: Family Medicine | Admitting: Family Medicine

## 2020-02-11 ENCOUNTER — Encounter: Payer: Self-pay | Admitting: Family Medicine

## 2020-02-11 ENCOUNTER — Ambulatory Visit (INDEPENDENT_AMBULATORY_CARE_PROVIDER_SITE_OTHER): Payer: Medicare Other | Admitting: Family Medicine

## 2020-02-11 ENCOUNTER — Other Ambulatory Visit: Payer: Self-pay

## 2020-02-11 VITALS — BP 112/78 | HR 88 | Ht 67.0 in | Wt 207.0 lb

## 2020-02-11 DIAGNOSIS — M25511 Pain in right shoulder: Secondary | ICD-10-CM

## 2020-02-11 DIAGNOSIS — I10 Essential (primary) hypertension: Secondary | ICD-10-CM

## 2020-02-11 DIAGNOSIS — E782 Mixed hyperlipidemia: Secondary | ICD-10-CM

## 2020-02-11 DIAGNOSIS — F329 Major depressive disorder, single episode, unspecified: Secondary | ICD-10-CM

## 2020-02-11 DIAGNOSIS — K219 Gastro-esophageal reflux disease without esophagitis: Secondary | ICD-10-CM | POA: Diagnosis not present

## 2020-02-11 DIAGNOSIS — R739 Hyperglycemia, unspecified: Secondary | ICD-10-CM

## 2020-02-11 MED ORDER — SERTRALINE HCL 50 MG PO TABS: 75.0000 mg | ORAL_TABLET | Freq: Every day | ORAL | 1 refills | Status: AC

## 2020-02-11 MED ORDER — FAMOTIDINE 40 MG PO TABS: 40.0000 mg | ORAL_TABLET | Freq: Every day | ORAL | 1 refills | Status: AC

## 2020-02-11 MED ORDER — ATORVASTATIN CALCIUM 10 MG PO TABS: ORAL_TABLET | ORAL | 1 refills | Status: AC

## 2020-02-11 MED ORDER — TRAZODONE HCL 50 MG PO TABS: 50.0000 mg | ORAL_TABLET | Freq: Every day | ORAL | 1 refills | Status: AC

## 2020-02-11 NOTE — Patient Instructions (Signed)
Mediterranean Diet A Mediterranean diet refers to food and lifestyle choices that are based on the traditions of countries located on the [place]. This way of eating has been shown to help prevent certain conditions and improve outcomes for people who have chronic diseases, like kidney disease and heart disease. What are tips for following this plan? Lifestyle  Cook and eat meals together with your family, when possible.  Drink enough fluid to keep your urine clear or pale yellow.  Be physically active every day. This includes: ? Aerobic exercise like running or swimming. ? Leisure activities like gardening, walking, or housework.  Get 7-8 hours of sleep each night.  If recommended by your health care provider, drink red wine in moderation. This means 1 glass a day for nonpregnant women and 2 glasses a day for men. A glass of wine equals 5 oz (150 mL). Reading food labels  Check the serving size of packaged foods. For foods such as rice and pasta, the serving size refers to the amount of cooked product, not dry.  Check the total fat in packaged foods. Avoid foods that have saturated fat or trans fats.  Check the ingredients list for added sugars, such as corn syrup.   Shopping  At the grocery store, buy most of your food from the areas near the walls of the store. This includes: ? Fresh fruits and vegetables (produce). ? Grains, beans, nuts, and seeds. Some of these may be available in unpackaged forms or large amounts (in bulk). ? Fresh seafood. ? Poultry and eggs. ? Low-fat dairy products.  Buy whole ingredients instead of prepackaged foods.  Buy fresh fruits and vegetables in-season from local farmers markets.  Buy frozen fruits and vegetables in resealable bags.  If you do not have access to quality fresh seafood, buy precooked frozen shrimp or canned fish, such as tuna, salmon, or sardines.  Buy small amounts of raw or cooked vegetables, salads, or olives from  the deli or salad bar at your store.  Stock your pantry so you always have certain foods on hand, such as olive oil, canned tuna, canned tomatoes, rice, pasta, and beans. Cooking  Cook foods with extra-virgin olive oil instead of using butter or other vegetable oils.  Have meat as a side dish, and have vegetables or grains as your main dish. This means having meat in small portions or adding small amounts of meat to foods like pasta or stew.  Use beans or vegetables instead of meat in common dishes like chili or lasagna.  Experiment with different cooking methods. Try roasting or broiling vegetables instead of steaming or sauteing them.  Add frozen vegetables to soups, stews, pasta, or rice.  Add nuts or seeds for added healthy fat at each meal. You can add these to yogurt, salads, or vegetable dishes.  Marinate fish or vegetables using olive oil, lemon juice, garlic, and fresh herbs. Meal planning  Plan to eat 1 vegetarian meal one day each week. Try to work up to 2 vegetarian meals, if possible.  Eat seafood 2 or more times a week.  Have healthy snacks readily available, such as: ? Vegetable sticks with hummus. ? Greek yogurt. ? Fruit and nut trail mix.  Eat balanced meals throughout the week. This includes: ? Fruit: 2-3 servings a day ? Vegetables: 4-5 servings a day ? Low-fat dairy: 2 servings a day ? Fish, poultry, or lean meat: 1 serving a day ? Beans and legumes: 2 or more servings a week ?   Nuts and seeds: 1-2 servings a day ? Whole grains: 6-8 servings a day ? Extra-virgin olive oil: 3-4 servings a day  Limit red meat and sweets to only a few servings a month   What are my food choices?  Mediterranean diet ? Recommended  Grains: Whole-grain pasta. Brown rice. Bulgar wheat. Polenta. Couscous. Whole-wheat bread. Oatmeal. Quinoa.  Vegetables: Artichokes. Beets. Broccoli. Cabbage. Carrots. Eggplant. Green beans. Chard. Kale. Spinach. Onions. Leeks. Peas. Squash.  Tomatoes. Peppers. Radishes.  Fruits: Apples. Apricots. Avocado. Berries. Bananas. Cherries. Dates. Figs. Grapes. Lemons. Melon. Oranges. Peaches. Plums. Pomegranate.  Meats and other protein foods: Beans. Almonds. Sunflower seeds. Pine nuts. Peanuts. Cod. Salmon. Scallops. Shrimp. Tuna. Tilapia. Clams. Oysters. Eggs.  Dairy: Low-fat milk. Cheese. Greek yogurt.  Beverages: Water. Red wine. Herbal tea.  Fats and oils: Extra virgin olive oil. Avocado oil. Grape seed oil.  Sweets and desserts: Greek yogurt with honey. Baked apples. Poached pears. Trail mix.  Seasoning and other foods: Basil. Cilantro. Coriander. Cumin. Mint. Parsley. Sage. Rosemary. Tarragon. Garlic. Oregano. Thyme. Pepper. Balsalmic vinegar. Tahini. Hummus. Tomato sauce. Olives. Mushrooms. ? Limit these  Grains: Prepackaged pasta or rice dishes. Prepackaged cereal with added sugar.  Vegetables: Deep fried potatoes (french fries).  Fruits: Fruit canned in syrup.  Meats and other protein foods: Beef. Pork. Lamb. Poultry with skin. Hot dogs. Bacon.  Dairy: Ice cream. Sour cream. Whole milk.  Beverages: Juice. Sugar-sweetened soft drinks. Beer. Liquor and spirits.  Fats and oils: Butter. Canola oil. Vegetable oil. Beef fat (tallow). Lard.  Sweets and desserts: Cookies. Cakes. Pies. Candy.  Seasoning and other foods: Mayonnaise. Premade sauces and marinades. The items listed may not be a complete list. Talk with your dietitian about what dietary choices are right for you. Summary  The Mediterranean diet includes both food and lifestyle choices.  Eat a variety of fresh fruits and vegetables, beans, nuts, seeds, and whole grains.  Limit the amount of red meat and sweets that you eat.  Talk with your health care provider about whether it is safe for you to drink red wine in moderation. This means 1 glass a day for nonpregnant women and 2 glasses a day for men. A glass of wine equals 5 oz (150 mL). This information  is not intended to replace advice given to you by your health care provider. Make sure you discuss any questions you have with your health care provider. Document Revised: 03/18/2016 Document Reviewed: 03/11/2016 Elsevier Patient Education  2020 Elsevier Inc.  

## 2020-02-11 NOTE — Progress Notes (Signed)
Date:  02/11/2020   Name:  Howard Anderson   DOB:  August 11, 1945   MRN:  [id number]   Chief Complaint: Shoulder Pain (Patient fell one week ago. Rt shoulder pain since fall.), Insomnia (Refill trazodone.), Hyperlipidemia (Rf atorvastatin.), Depression (Rf Zoloft.- 2 and 4- increase to 1.5 tabs= 75mg ), and Gastroesophageal Reflux  Shoulder Pain  This is a chronic problem. The current episode started more than 1 year ago. There has been a history of trauma (recent fall). The problem occurs intermittently. The problem has been waxing and waning. The quality of the pain is described as aching. The pain is mild. Associated symptoms include a limited range of motion. Pertinent negatives include no fever, inability to bear weight, itching, joint locking, joint swelling, numbness, stiffness or tingling. He has tried nothing for the symptoms. The treatment provided mild relief. There is no history of diabetes.  Insomnia Primary symptoms: no fragmented sleep, no sleep disturbance, no difficulty falling asleep, no somnolence, no frequent awakening, no premature morning awakening, no malaise/fatigue, no napping.   The current episode started more than one year. The problem has been waxing and waning since onset. PMH includes: depression.  Hyperlipidemia This is a chronic problem. The current episode started more than 1 year ago. The problem is controlled. Recent lipid tests were reviewed and are normal. He has no history of chronic renal disease, diabetes, hypothyroidism, liver disease, obesity or nephrotic syndrome. Pertinent negatives include no chest pain, focal sensory loss, focal weakness, leg pain, myalgias or shortness of breath. Current antihyperlipidemic treatment includes statins. The current treatment provides moderate improvement of lipids. There are no compliance problems.  Risk factors for coronary artery disease include dyslipidemia, hypertension, male sex and post-menopausal.  Depression         This is a chronic problem.  The current episode started more than 1 year ago.   The problem occurs intermittently.  The problem has been gradually improving since onset.  Associated symptoms include insomnia and sad.  Associated symptoms include no decreased concentration, no fatigue, no helplessness, no hopelessness, not irritable, no restlessness, no decreased interest, no appetite change, no body aches, no myalgias, no headaches and no suicidal ideas.  Past treatments include SSRIs - Selective serotonin reuptake inhibitors.  Compliance with treatment is variable.  Previous treatment provided mild relief.   Pertinent negatives include no hypothyroidism. Gastroesophageal Reflux He reports no abdominal pain, no belching, no chest pain, no choking, no coughing, no dysphagia, no hoarse voice, no nausea, no sore throat or no wheezing. The current episode started more than 1 year ago. The problem has been gradually improving. The symptoms are aggravated by certain foods. Pertinent negatives include no fatigue, melena, muscle weakness, orthopnea or weight loss.    Lab Results  Component Value Date   CREATININE 1.00 06/12/2019   BUN 26 06/12/2019   NA 141 06/12/2019   K 5.5 (H) 06/12/2019   CL 103 06/12/2019   CO2 26 06/12/2019   Lab Results  Component Value Date   CHOL 97 (L) 06/12/2019   HDL 42 06/12/2019   LDLCALC 39 06/12/2019   TRIG 74 06/12/2019   CHOLHDL 2.3 12/08/2017   No results found for: TSH No results found for: HGBA1C Lab Results  Component Value Date   WBC 5.8 03/09/2016   HGB 14.6 03/09/2016   HCT 43.3 03/09/2016   MCV 88.2 03/09/2016   PLT 263 03/09/2016   Lab Results  Component Value Date   ALT 8 06/12/2019  AST 12 06/12/2019   ALKPHOS 58 06/12/2019   BILITOT 0.6 06/12/2019     Review of Systems  Constitutional: Negative for appetite change, chills, fatigue, fever, malaise/fatigue and weight loss.  HENT: Negative for drooling, ear discharge, ear pain, hoarse  voice and sore throat.   Respiratory: Negative for cough, choking, shortness of breath and wheezing.   Cardiovascular: Negative for chest pain, palpitations and leg swelling.  Gastrointestinal: Negative for abdominal pain, blood in stool, constipation, diarrhea, dysphagia, melena and nausea.  Endocrine: Negative for polydipsia.  Genitourinary: Negative for dysuria, frequency, hematuria and urgency.  Musculoskeletal: Negative for back pain, myalgias, muscle weakness, neck pain and stiffness.  Skin: Negative for itching and rash.  Allergic/Immunologic: Negative for environmental allergies.  Neurological: Negative for dizziness, tingling, focal weakness, numbness and headaches.  Hematological: Does not bruise/bleed easily.  Psychiatric/Behavioral: Positive for depression. Negative for decreased concentration, sleep disturbance and suicidal ideas. The patient has insomnia. The patient is not nervous/anxious.     Patient Active Problem List   Diagnosis Date Noted  . Shoulder pain 08/11/2017  . Urinary urgency 08/11/2017  . Venous insufficiency of both lower extremities 10/25/2016  . Bilateral leg edema 10/19/2016  . Prostate cancer ([place]) 09/27/2016  . Essential hypertension 10/29/2015  . Hyperlipidemia 10/29/2015  . Esophageal reflux 10/29/2015  . Centrilobular emphysema ([place]) 10/29/2015  . Labyrinthitis 10/29/2015  . Microhematuria 09/29/2015  . 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 Cerebral vascular disease 06/21/2015  . AAA (abdominal aortic aneurysm) ([place]) 05/21/2015  . Degenerative disc disease, lumbar 04/24/2015  . Urinary incontinence, male, stress 04/24/2015  . Dizziness 01/20/2015  . Endocarditis 01/20/2015  . Aneurysm of abdominal vessel ([place]) 08/30/2014  . Chronic obstructive pulmonary disease ([place]) 08/30/2014  . Personal history of transient ischemic attack (TIA), and cerebral infarction without residual deficits 08/30/2014    No Known Allergies  Past  Surgical History:  Procedure Laterality Date  . CATARACT EXTRACTION W/PHACO Left 09/27/2018   Procedure: CATARACT EXTRACTION PHACO AND INTRAOCULAR LENS PLACEMENT ([place]) LEFT;  Surgeon: Leandrew Koyanagi, MD;  Location: [facility];  Service: Ophthalmology;  Laterality: Left;  [place]  00:42 AP% 15.1 CDE 5.16 Fluid pack lot # [id number]  . CATARACT EXTRACTION W/PHACO Right 02/28/2019   Procedure: CATARACT EXTRACTION PHACO AND INTRAOCULAR LENS PLACEMENT ([place]) RIGHT MALYUGIN;  Surgeon: Leandrew Koyanagi, MD;  Location: [place];  Service: Ophthalmology;  Laterality: Right;  . COLONOSCOPY  2010  . EMBOLECTOMY Left 06/20/2015   Procedure: EMBOLECTOMY;  Surgeon: [name], MD;  Location: [facility];  Service: Vascular;  Laterality: Left;  brachial thrombectomy  . ENDARTERECTOMY FEMORAL Left 06/20/2015   Procedure: ENDARTERECTOMY FEMORAL/FEMORAL PSEUDOANEURYSM repair;  Surgeon: [name], MD;  Location: [facility];  Service: Vascular;  Laterality: Left;  femerol  . FRACTURE SURGERY Right    SHOULDER  . INNER EAR SURGERY    . MANDIBLE SURGERY     TUMOR  . PERIPHERAL VASCULAR CATHETERIZATION N/A 05/21/2015   Procedure: Endovascular Repair/Stent Graft;  Surgeon: [name], MD;  Location: [place] [facility];  Service: Cardiovascular;  Laterality: N/A;  . PERIPHERAL VASCULAR CATHETERIZATION  06/20/2015   Procedure: Lower Extremity Angiography;  Surgeon: [name], MD;  Location: [place] [facility];  Service: Cardiovascular;;  . PERIPHERAL VASCULAR CATHETERIZATION  06/20/2015   Procedure: Lower Extremity Intervention;  Surgeon: [name], MD;  Location: [place] [facility];  Service: Cardiovascular;;    Social History   Tobacco Use  . Smoking  status: Former Smoker    Packs/day: 1.50    Years: 48.00    Pack years: 72.00    Quit date: 10/10/2013    Years since quitting: 6.3  . Smokeless tobacco: Never Used  Vaping Use  . Vaping Use: Never used    Substance Use Topics  . Alcohol use: No  . Drug use: No     Medication list has been reviewed and updated.  Current Meds  Medication Sig  . aspirin 81 MG tablet Take 1 tablet (81 mg total) by mouth daily.  [name] atorvastatin (LIPITOR) 10 MG tablet TAKE 1 TABLET BY MOUTH DAILY  . brimonidine-timolol (COMBIGAN) 0.2-0.5 % ophthalmic solution Place 1 drop into both eyes every 12 (twelve) hours.  . carbidopa-levodopa (SINEMET IR) 25-100 MG tablet Take 1.5 tablets by mouth 3 (three) times daily. Dr Manuella Ghazi  . Cholecalciferol (VITAMIN D3) 1000 UNITS CAPS Take 1,000 Units by mouth daily.   . dorzolamide (TRUSOPT) 2 % ophthalmic solution Place 1 drop into both eyes 2 (two) times daily.   [name] doxycycline (PERIOSTAT) 20 MG tablet Take 20 mg by mouth 2 (two) times daily. dentist  . famotidine (PEPCID) 40 MG tablet TAKE 1 TABLET BY MOUTH DAILY  . latanoprost (XALATAN) 0.005 % ophthalmic solution Place 1 drop into both eyes at bedtime.   . Multiple Vitamins-Minerals (PRESERVISION AREDS PO) Take 1 capsule by mouth 2 (two) times daily.  . sertraline (ZOLOFT) 50 MG tablet TAKE 1 TABLET BY MOUTH DAILY  . tamsulosin (FLOMAX) 0.4 MG CAPS capsule TAKE 1 CAPSULE BY MOUTH ONCE DAILY  . traZODone (DESYREL) 50 MG tablet Take 1 tablet (50 mg total) by mouth at bedtime.  . valsartan (DIOVAN) 80 MG tablet TAKE 1 TABLET (80 MG TOTAL) BY MOUTH ONCE DAILY. (Patient taking differently: Take 80 mg by mouth daily. TAKE 1 TABLET (80 MG TOTAL) BY MOUTH ONCE DAILY./ cardio)  . vitamin B-12 (CYANOCOBALAMIN) 1000 MCG tablet Take 1,000 mcg by mouth daily.    PHQ 2/9 Scores 02/11/2020 06/12/2019 11/17/2018 12/08/2017  PHQ - 2 Score 2 3 1  0  PHQ- 9 Score 2 3 2  -    GAD 7 : Generalized Anxiety Score 02/11/2020 06/12/2019  Nervous, Anxious, on Edge 0 1  Control/stop worrying 2 0  Worry too much - different things 2 1  Trouble relaxing 0 0  Restless 0 0  Easily annoyed or irritable 0 0  Afraid - awful might happen 0 0  Total GAD 7  Score 4 2  Anxiety Difficulty Not difficult at all Somewhat difficult    BP Readings from Last 3 Encounters:  02/11/20 112/78  01/21/20 110/72  06/12/19 122/62    Physical Exam Vitals and nursing note reviewed.  Constitutional:      General: He is not irritable. HENT:     Head: Normocephalic.     Right Ear: Tympanic membrane, ear canal and external ear normal.     Left Ear: Tympanic membrane, ear canal and external ear normal.     Nose: Nose normal. No congestion or rhinorrhea.     Mouth/Throat:     Mouth: Mucous membranes are moist.  Eyes:     General: No scleral icterus.       Right eye: No discharge.        Left eye: No discharge.     Conjunctiva/sclera: Conjunctivae normal.     Pupils: Pupils are equal, round, and reactive to light.  Neck:     Thyroid: No thyromegaly.  Vascular: No JVD.     Trachea: No tracheal deviation.  Cardiovascular:     Rate and Rhythm: Normal rate and regular rhythm.     Heart sounds: Normal heart sounds. No murmur heard.  No friction rub. No gallop.   Pulmonary:     Effort: No respiratory distress.     Breath sounds: Normal breath sounds. No wheezing, rhonchi or rales.  Abdominal:     General: Bowel sounds are normal.     Palpations: Abdomen is soft. There is no mass.     Tenderness: There is no abdominal tenderness. There is no guarding or rebound.  Musculoskeletal:        General: No tenderness.     Right shoulder: No swelling, deformity, effusion, laceration, tenderness, bony tenderness or crepitus. Decreased range of motion. Normal strength. Normal pulse.     Cervical back: Normal range of motion and neck supple.  Lymphadenopathy:     Cervical: No cervical adenopathy.  Skin:    General: Skin is warm.     Findings: No rash.  Neurological:     Mental Status: He is alert and oriented to person, place, and time.     Cranial Nerves: No cranial nerve deficit.     Deep Tendon Reflexes: Reflexes are normal and symmetric.     Wt  Readings from Last 3 Encounters:  02/11/20 207 lb (93.9 kg)  01/21/20 210 lb (95.3 kg)  06/12/19 199 lb (90.3 kg)    BP 112/78   Pulse 88   Ht 5\' 7"  (1.702 m)   Wt 207 lb (93.9 kg)   SpO2 97%   BMI 32.42 kg/m   Assessment and Plan:  1. Mixed hyperlipidemia Chronic.  Controlled.  Stable.  Continue atorvastatin 10 mg once a day.  Will check lipid panel. - atorvastatin (LIPITOR) 10 MG tablet; TAKE 1 TABLET BY MOUTH DAILY  Dispense: 90 tablet; Refill: 1 - Lipid Panel With LDL/HDL Ratio  2. Reactive depression Chronic.  Controlled.  Stable.  PHQ is 2 with a gad score of 4.  Continue sertraline 75 mg once a day. - sertraline (ZOLOFT) 50 MG tablet; Take 1.5 tablets (75 mg total) by mouth daily.  Dispense: 135 tablet; Refill: 1  3. Gastroesophageal reflux disease, unspecified whether esophagitis present .  Controlled.  Stable.  Continue famotidine 40 mg once a day. - famotidine (PEPCID) 40 MG tablet; Take 1 tablet (40 mg total) by mouth daily.  Dispense: 90 tablet; Refill: 1  4. Acute pain of right shoulder New onset.  Patient has developed pain of the right shoulder and proximal humerus.  Patient sustained a fall couple weeks ago with resolution and healing of laceration but still has pain with limited range of motion.  Patient is unable to abduct this suggested may be some capsulitis in the meantime we will get an x-ray to make sure that there is no involvement of the humerus such as fracture or dislocation. - DG Shoulder Right  5. Essential hypertension Chronic.  Controlled.  Stable.  Will check comprehensive metabolic panel. - Comprehensive Metabolic Panel (CMET)  6. Hyperglycemia Review of previous renal function panel patient has had some elevation of his glucose and we will check an A1c for evaluation of prediabetes. - HgB A1c

## 2020-02-12 ENCOUNTER — Other Ambulatory Visit: Payer: Self-pay

## 2020-02-12 DIAGNOSIS — Z1211 Encounter for screening for malignant neoplasm of colon: Secondary | ICD-10-CM

## 2020-02-12 DIAGNOSIS — M19111 Post-traumatic osteoarthritis, right shoulder: Secondary | ICD-10-CM

## 2020-02-12 LAB — COMPREHENSIVE METABOLIC PANEL
ALT: 7 IU/L (ref 0–44)
AST: 11 IU/L (ref 0–40)
Albumin/Globulin Ratio: 1.9 (ref 1.2–2.2)
Albumin: 4.3 g/dL (ref 3.7–4.7)
Alkaline Phosphatase: 60 IU/L (ref 48–121)
BUN/Creatinine Ratio: 17 (ref 10–24)
BUN: 15 mg/dL (ref 8–27)
Bilirubin Total: 0.6 mg/dL (ref 0.0–1.2)
CO2: 25 mmol/L (ref 20–29)
Calcium: 8.9 mg/dL (ref 8.6–10.2)
Chloride: 103 mmol/L (ref 96–106)
Creatinine, Ser: 0.86 mg/dL (ref 0.76–1.27)
GFR calc Af Amer: 99 mL/min/{1.73_m2} (ref >59)
GFR calc non Af Amer: 85 mL/min/{1.73_m2} (ref >59)
Globulin, Total: 2.3 g/dL (ref 1.5–4.5)
Glucose: 101 mg/dL — ABNORMAL HIGH (ref 65–99)
Potassium: 4.7 mmol/L (ref 3.5–5.2)
Sodium: 140 mmol/L (ref 134–144)
Total Protein: 6.6 g/dL (ref 6.0–8.5)

## 2020-02-12 LAB — LIPID PANEL WITH LDL/HDL RATIO
Cholesterol, Total: 90 mg/dL — ABNORMAL LOW (ref 100–199)
HDL: 42 mg/dL (ref >39)
LDL Chol Calc (NIH): 33 mg/dL (ref 0–99)
LDL/HDL Ratio: 0.8 ratio (ref 0.0–3.6)
Triglycerides: 65 mg/dL (ref 0–149)
VLDL Cholesterol Cal: 15 mg/dL (ref 5–40)

## 2020-02-12 LAB — HEMOGLOBIN A1C
Est. average glucose Bld gHb Est-mCnc: 128 mg/dL
Hgb A1c MFr Bld: 6.1 % — ABNORMAL HIGH (ref 4.8–5.6)

## 2020-02-12 NOTE — Progress Notes (Unsigned)
Abstracted colonoscopy and put in for referral to GI

## 2020-02-12 NOTE — Progress Notes (Unsigned)
Ref ortho placed

## 2020-02-13 ENCOUNTER — Ambulatory Visit: Payer: Self-pay | Admitting: Family Medicine

## 2020-02-15 ENCOUNTER — Emergency Department: Payer: Medicare Other

## 2020-02-15 ENCOUNTER — Ambulatory Visit: Payer: Medicare Other | Admitting: Family Medicine

## 2020-02-15 ENCOUNTER — Other Ambulatory Visit: Payer: Self-pay

## 2020-02-15 ENCOUNTER — Emergency Department
Admission: EM | Admit: 2020-02-15 | Discharge: 2020-02-15 | Disposition: A | Discharge status: Home / Self Care | Payer: Medicare Other | Attending: Emergency Medicine | Admitting: Emergency Medicine

## 2020-02-15 DIAGNOSIS — I10 Essential (primary) hypertension: Secondary | ICD-10-CM | POA: Diagnosis not present

## 2020-02-15 DIAGNOSIS — M25511 Pain in right shoulder: Secondary | ICD-10-CM | POA: Diagnosis not present

## 2020-02-15 DIAGNOSIS — Z87891 Personal history of nicotine dependence: Secondary | ICD-10-CM | POA: Diagnosis not present

## 2020-02-15 DIAGNOSIS — G8929 Other chronic pain: Secondary | ICD-10-CM | POA: Insufficient documentation

## 2020-02-15 DIAGNOSIS — R1030 Lower abdominal pain, unspecified: Secondary | ICD-10-CM | POA: Insufficient documentation

## 2020-02-15 DIAGNOSIS — G20 Parkinson's disease: Secondary | ICD-10-CM | POA: Insufficient documentation

## 2020-02-15 DIAGNOSIS — W19XXXA Unspecified fall, initial encounter: Secondary | ICD-10-CM

## 2020-02-15 DIAGNOSIS — Z7982 Long term (current) use of aspirin: Secondary | ICD-10-CM | POA: Insufficient documentation

## 2020-02-15 DIAGNOSIS — Z79899 Other long term (current) drug therapy: Secondary | ICD-10-CM | POA: Diagnosis not present

## 2020-02-15 DIAGNOSIS — Z8546 Personal history of malignant neoplasm of prostate: Secondary | ICD-10-CM | POA: Insufficient documentation

## 2020-02-15 DIAGNOSIS — R519 Headache, unspecified: Secondary | ICD-10-CM | POA: Diagnosis not present

## 2020-02-15 LAB — COMPREHENSIVE METABOLIC PANEL
ALT: 8 U/L (ref 0–44)
AST: 15 U/L (ref 15–41)
Albumin: 4.3 g/dL (ref 3.5–5.0)
Alkaline Phosphatase: 52 U/L (ref 38–126)
Anion gap: 9 (ref 5–15)
BUN: 21 mg/dL (ref 8–23)
CO2: 27 mmol/L (ref 22–32)
Calcium: 9.1 mg/dL (ref 8.9–10.3)
Chloride: 102 mmol/L (ref 98–111)
Creatinine, Ser: 0.98 mg/dL (ref 0.61–1.24)
GFR calc Af Amer: >60 mL/min (ref >60)
GFR calc non Af Amer: >60 mL/min (ref >60)
Glucose, Bld: 113 mg/dL — ABNORMAL HIGH (ref 70–99)
Potassium: 4.7 mmol/L (ref 3.5–5.1)
Sodium: 138 mmol/L (ref 135–145)
Total Bilirubin: 0.9 mg/dL (ref 0.3–1.2)
Total Protein: 7.3 g/dL (ref 6.5–8.1)

## 2020-02-15 LAB — URINALYSIS, COMPLETE (UACMP) WITH MICROSCOPIC
Bacteria, UA: NONE SEEN
Bilirubin Urine: NEGATIVE
Glucose, UA: NEGATIVE mg/dL
Hgb urine dipstick: NEGATIVE
Ketones, ur: 5 mg/dL — AB
Leukocytes,Ua: NEGATIVE
Nitrite: NEGATIVE
Protein, ur: NEGATIVE mg/dL
Specific Gravity, Urine: 1.02 (ref 1.005–1.030)
pH: 5 (ref 5.0–8.0)

## 2020-02-15 LAB — CBC WITH DIFFERENTIAL/PLATELET
Abs Immature Granulocytes: 0.02 10*3/uL (ref 0.00–0.07)
Basophils Absolute: 0 10*3/uL (ref 0.0–0.1)
Basophils Relative: 0 %
Eosinophils Absolute: 0 10*3/uL (ref 0.0–0.5)
Eosinophils Relative: 0 %
HCT: 43.5 % (ref 39.0–52.0)
Hemoglobin: 14.2 g/dL (ref 13.0–17.0)
Immature Granulocytes: 0 %
Lymphocytes Relative: 16 %
Lymphs Abs: 1.1 10*3/uL (ref 0.7–4.0)
MCH: 29.9 pg (ref 26.0–34.0)
MCHC: 32.6 g/dL (ref 30.0–36.0)
MCV: 91.6 fL (ref 80.0–100.0)
Monocytes Absolute: 0.7 10*3/uL (ref 0.1–1.0)
Monocytes Relative: 11 %
Neutro Abs: 5.1 10*3/uL (ref 1.7–7.7)
Neutrophils Relative %: 73 %
Platelets: 210 10*3/uL (ref 150–400)
RBC: 4.75 MIL/uL (ref 4.22–5.81)
RDW: 14 % (ref 11.5–15.5)
WBC: 6.9 10*3/uL (ref 4.0–10.5)
nRBC: 0 % (ref 0.0–0.2)

## 2020-02-15 LAB — CK: Total CK: 99 U/L (ref 49–397)

## 2020-02-15 MED ORDER — ACETAMINOPHEN 325 MG PO TABS
650.0000 mg | ORAL_TABLET | Freq: Once | ORAL | Status: AC
  Administered 2020-02-15: 650 mg via ORAL
  Filled 2020-02-15: qty 2

## 2020-02-15 MED ORDER — IOHEXOL 300 MG/ML  SOLN
100.0000 mL | Freq: Once | INTRAMUSCULAR | Status: AC | PRN
  Administered 2020-02-15: 100 mL via INTRAVENOUS
  Filled 2020-02-15: qty 100

## 2020-02-15 NOTE — ED Notes (Signed)
See triage note  Presents s/p fall last pm  Having pain to head and rib area    Was sent in from [place]

## 2020-02-15 NOTE — ED Triage Notes (Signed)
FIRST NURSE NOTE: [facility] provider called prior to pt's arrival to note the patient had a fall and hit his head. Pt also was complaining of abd pain and imaging was done and there was concern of free fluid in the abdomen at [facility].

## 2020-02-15 NOTE — Discharge Instructions (Addendum)
Your exam, labs, chest x-ray, and CT scan are all normal and reassuring at this time.  No sign of any acute fracture to the ribs, no signs of any internal injury or bleeding related to your fall.  Your symptoms may represent muscle wall strain or contusion.  You should continue to take Tylenol as needed for pain, and follow-up with a primary provider.  Your CT did reveal a calcified gallbladder, this should be reevaluated by a general surgeon for elective removal.  Follow-up with your provider, or return to the ED as needed.

## 2020-02-15 NOTE — ED Triage Notes (Addendum)
Pt A&O, ambulatory with cane. States hx parkinsons. Last night was having trouble getting out of a chair and fell. C/o of pain to R side of body- arm, head. Denies LOC. Denies blood thinners.   [facility] sent pt to ER for CT scans.

## 2020-02-15 NOTE — ED Provider Notes (Signed)
[facility] Emergency Department Provider Note ____________________________________________  Time seen: 1526  I have reviewed the triage vital signs and the nursing notes.  HISTORY  Chief Complaint  Fall  HPI Howard Anderson is a 74 y.o. male presents himself to the ED for evaluation of injury sustained following a fall.  Patient with history of Parkinson, is a widower who lives alone.  He describes mechanical fall last night as he tried to get up out of his chair.  He reports being down for approximately 30 to 45 minutes, but denies any head injury or loss of consciousness.   This morning he called his primary provider to seek medical advice.  He was advised by his PCP, to report to the ED for further evaluation.  [facility] evaluation of generalized pain to the right side of his body including his right shoulder which is chronic, his right and left lower abdomen, and mild pain to his head.  He denies any cuts, scrapes, or abrasions.  He notes pain to his lower abdomen intermittently when he changes positions.  Patient denies any nausea, vomiting, chest pain, shortness of breath he also denies any metabolic causes, footdrop, or paresthesias.  He denies any pain to the chest or ribs, despite a note from the urgent care provider noting anterior rib fractures on the left and free fluid in the abdomen on plain films the reported today.  Past Medical History:  Diagnosis Date  . AAA (abdominal aortic aneurysm) without rupture ([place])   . AAA (abdominal aortic aneurysm) without rupture ([place])   . Cancer [facility])    prostate cancer  . Chest pain   . COPD (chronic obstructive pulmonary disease) ([place])   . Depression   . Dizziness and giddiness   . Dvt femoral (deep venous thrombosis) ([facility])    H/O LEG  . GERD (gastroesophageal reflux disease)   . Heart disease   . Hyperlipidemia   . Hypertension   . Parkinson disease ([place])   . Prostate cancer  ([place])   . Shortness of breath dyspnea    DOE  . Shoulder fracture, right   . Stroke [facility])    tia    Patient Active Problem List   Diagnosis Date Noted  . Shoulder pain 08/11/2017  . Urinary urgency 08/11/2017  . Venous insufficiency of both lower extremities 10/25/2016  . Bilateral leg edema 10/19/2016  . Prostate cancer ([place]) 09/27/2016  . Essential hypertension 10/29/2015  . Hyperlipidemia 10/29/2015  . Esophageal reflux 10/29/2015  . Centrilobular emphysema ([place]) 10/29/2015  . Labyrinthitis 10/29/2015  . Microhematuria 09/29/2015  . Femoral artery aneurysm, left ([place]) 06/22/2015  . Cerebral vascular disease 06/21/2015  . AAA (abdominal aortic aneurysm) ([place]) 05/21/2015  . Degenerative disc disease, lumbar 04/24/2015  . Urinary incontinence, male, stress 04/24/2015  . Dizziness 01/20/2015  . Endocarditis 01/20/2015  . Aneurysm of abdominal vessel ([place]) 08/30/2014  . Chronic obstructive pulmonary disease ([place]) 08/30/2014  . Personal history of transient ischemic attack (TIA), and cerebral infarction without residual deficits 08/30/2014    Past Surgical History:  Procedure Laterality Date  . CATARACT EXTRACTION W/PHACO Left 09/27/2018   Procedure: CATARACT EXTRACTION PHACO AND INTRAOCULAR LENS PLACEMENT ([place]) LEFT;  Surgeon: Leandrew Koyanagi, MD;  Location: [facility] ORS;  Service: Ophthalmology;  Laterality: Left;  [place]  00:42 AP% 15.1 CDE 5.16 Fluid pack lot # [id number]  . CATARACT EXTRACTION W/PHACO Right 02/28/2019   Procedure: CATARACT EXTRACTION PHACO AND INTRAOCULAR LENS PLACEMENT (  [place]) RIGHT MALYUGIN;  Surgeon: Leandrew Koyanagi, MD;  Location: [place];  Service: Ophthalmology;  Laterality: Right;  . COLONOSCOPY  2010  . EMBOLECTOMY Left 06/20/2015   Procedure: EMBOLECTOMY;  Surgeon: [name], MD;  Location: [facility] ORS;  Service: Vascular;  Laterality: Left;  brachial thrombectomy  . ENDARTERECTOMY FEMORAL Left 06/20/2015   Procedure:  ENDARTERECTOMY FEMORAL/FEMORAL PSEUDOANEURYSM repair;  Surgeon: [name], MD;  Location: [facility] ORS;  Service: Vascular;  Laterality: Left;  femerol  . FRACTURE SURGERY Right    SHOULDER  . INNER EAR SURGERY    . MANDIBLE SURGERY     TUMOR  . PERIPHERAL VASCULAR CATHETERIZATION N/A 05/21/2015   Procedure: Endovascular Repair/Stent Graft;  Surgeon: [name], MD;  Location: [place] [facility];  Service: Cardiovascular;  Laterality: N/A;  . PERIPHERAL VASCULAR CATHETERIZATION  06/20/2015   Procedure: Lower Extremity Angiography;  Surgeon: [name], MD;  Location: [place] [facility];  Service: Cardiovascular;;  . PERIPHERAL VASCULAR CATHETERIZATION  06/20/2015   Procedure: Lower Extremity Intervention;  Surgeon: [name], MD;  Location: [place] [facility];  Service: Cardiovascular;;    Prior to Admission medications   Medication Sig Start Date End Date Taking? Authorizing Provider  aspirin 81 MG tablet Take 1 tablet (81 mg total) by mouth daily. 03/17/18   Juline Patch, MD  atorvastatin (LIPITOR) 10 MG tablet TAKE 1 TABLET BY MOUTH DAILY 02/11/20   Juline Patch, MD  brimonidine-timolol (COMBIGAN) 0.2-0.5 % ophthalmic solution Place 1 drop into both eyes every 12 (twelve) hours.    [provider]  carbidopa-levodopa (SINEMET IR) 25-100 MG tablet Take 1.5 tablets by mouth 3 (three) times daily. Dr Manuella Ghazi    [provider]  Cholecalciferol (VITAMIN D3) 1000 UNITS CAPS Take 1,000 Units by mouth daily.     [provider]  dorzolamide (TRUSOPT) 2 % ophthalmic solution Place 1 drop into both eyes 2 (two) times daily.  07/30/16   [provider]  doxycycline (PERIOSTAT) 20 MG tablet Take 20 mg by mouth 2 (two) times daily. dentist    [provider]  famotidine (PEPCID) 40 MG tablet Take 1 tablet (40 mg total) by mouth daily. 02/11/20   Juline Patch, MD  latanoprost (XALATAN) 0.005 % ophthalmic solution Place 1  drop into both eyes at bedtime.     [provider]  Multiple Vitamins-Minerals (PRESERVISION AREDS PO) Take 1 capsule by mouth 2 (two) times daily.    [provider]  sertraline (ZOLOFT) 50 MG tablet Take 1.5 tablets (75 mg total) by mouth daily. 02/11/20   Juline Patch, MD  tamsulosin (FLOMAX) 0.4 MG CAPS capsule TAKE 1 CAPSULE BY MOUTH ONCE DAILY 12/13/19   Stoioff, Ronda Fairly, MD  traZODone (DESYREL) 50 MG tablet Take 1 tablet (50 mg total) by mouth at bedtime. 02/11/20   Juline Patch, MD  valsartan (DIOVAN) 80 MG tablet TAKE 1 TABLET (80 MG TOTAL) BY MOUTH ONCE DAILY. Patient taking differently: Take 80 mg by mouth daily. TAKE 1 TABLET (80 MG TOTAL) BY MOUTH ONCE DAILY./ cardio 03/17/18   Juline Patch, MD  vitamin B-12 (CYANOCOBALAMIN) 1000 MCG tablet Take 1,000 mcg by mouth daily.    [provider]    Allergies Patient has no known allergies.  Family History  Problem Relation Age of Onset  . Prostate cancer Brother   . Bladder Cancer Neg Hx   . Kidney cancer Neg Hx  Social History Social History   Tobacco Use  . Smoking status: Former Smoker    Packs/day: 1.50    Years: 48.00    Pack years: 72.00    Quit date: 10/10/2013    Years since quitting: 6.3  . Smokeless tobacco: Never Used  Vaping Use  . Vaping Use: Never used  Substance Use Topics  . Alcohol use: No  . Drug use: No    Review of Systems  Constitutional: Negative for fever. Eyes: Negative for visual changes. ENT: Negative for sore throat. Cardiovascular: Negative for chest pain. Respiratory: Negative for shortness of breath. Gastrointestinal: Positive for lower abdominal pain. Denies nausea, vomiting and diarrhea. Genitourinary: Negative for dysuria. Musculoskeletal: Negative for back pain. Reports chronic right shoulder pain Skin: Negative for rash. Neurological: Negative for headaches, focal weakness or numbness. ____________________________________________  PHYSICAL  EXAM:  VITAL SIGNS: ED Triage Vitals  Enc Vitals Group     BP 02/15/20 1407 139/86     Pulse Rate 02/15/20 1407 84     Resp 02/15/20 1407 16     Temp 02/15/20 1407 99.4 F (37.4 C)     Temp Source 02/15/20 1407 Oral     SpO2 02/15/20 1407 96 %     Weight 02/15/20 1408 207 lb (93.9 kg)     Height 02/15/20 1408 5\' 7"  (1.702 m)     Head Circumference --      Peak Flow --      Pain Score 02/15/20 1408 9     Pain Loc --      Pain Edu? --      Excl. in [place]? --     Constitutional: Alert and oriented. Well appearing and in no distress. GCS = 15 Head: Normocephalic and atraumatic. No abrasion, hematoma, erythema, or laceration  Eyes: Conjunctivae are normal. PERRL. Normal extraocular movements Mouth/Throat: Mucous membranes are moist. Neck: Supple. No thyromegaly. Hematological/Lymphatic/Immunological: No cervical lymphadenopathy. Cardiovascular: Normal rate, regular rhythm. Normal distal pulses. No abdominal bruits.  Respiratory: Normal respiratory effort. No wheezes/rales/rhonchi. Gastrointestinal: Soft and nontender. Mild intermittent pain to the lower abdominal wall with transition from sit to stand. No distention, rebound, guarding, or rigidity. No CVA tendernss. Musculoskeletal: normal spinal alignment, midline tenderness, spasm, deformity, or step-off. Nontender with normal range of motion in all extremities.  Neurologic: CN II-XII grossly intact.  Normal gait without ataxia. Normal speech and language. No gross focal neurologic deficits are appreciated. Skin:  Skin is warm, dry and intact. No rash noted. Psychiatric: Mood and affect are normal. Patient exhibits appropriate insight and judgment. ____________________________________________   LABS (pertinent positives/negatives) Labs Reviewed  COMPREHENSIVE METABOLIC PANEL - Abnormal; Notable for the following components:      Result Value   Glucose, Bld 113 (*)    All other components within normal limits  URINALYSIS, COMPLETE  (UACMP) WITH MICROSCOPIC - Abnormal; Notable for the following components:   Color, Urine YELLOW (*)    APPearance CLEAR (*)    Ketones, ur 5 (*)    All other components within normal limits  CBC WITH DIFFERENTIAL/PLATELET  CK  ____________________________________________   RADIOLOGY  DG Left Rib w/ CXR IMPRESSION: Negative.  CT ABD/Pelvis w/ CM  IMPRESSION: No acute intra-abdominal injury. Incidental findings as noted above.  Porcelain gallbladder. Surgical consultation may be helpful as this does carry a slightly increased risk of gallbladder malignancy.  Interval decrease in size of abdominal aortic aneurysm following endovascular stent graft repair, now measuring 4.0 cm in greatest dimension. 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  Peripheral vascular disease with extensive calcific plaque within the lower extremity arterial inflow. Correlation for signs and symptoms of lower extremity arterial insufficiency may be helpful for further management.  Aortic Atherosclerosis (ICD10-I70.0). ____________________________________________  PROCEDURES  Tylenol 650 mg p.o. Procedures ____________________________________________  INITIAL IMPRESSION / ASSESSMENT AND PLAN / ED COURSE  Patient with ED evaluation of injuries following a mechanical fall at home 1 day prior.  He was initially evaluated by local urgent care and sent over for further imaging due to concerns for possible rib fracture and free fluid in the abdomen.  X-rays performed here of the chest were negative for any acute fracture and patient is without any acute chest wall pain.  CT abdomen/pelvis is also negative for any acute intra abdominal process, bleeding, or free fluid.  Patient is discharged at this time with reassuring vital signs, labs, and exam.  He is encouraged to follow with primary provider for ongoing symptoms.  He is also given instruction to consider elective gallbladder surgery given his  CT imaging confirming the porcelain gallbladder.  Howard Anderson was evaluated in Emergency Department on 02/16/2020 for the symptoms described in the history of present illness. He was evaluated in the context of the global COVID-19 pandemic, which necessitated consideration that the patient might be at risk for infection with the SARS-CoV-2 virus that causes COVID-19. Institutional protocols and algorithms that pertain to the evaluation of patients at risk for COVID-19 are in a state of rapid change based on information released by regulatory bodies including the CDC and federal and state organizations. These policies and algorithms were followed during the patient's care in the ED. ____________________________________________  FINAL CLINICAL IMPRESSION(S) / ED DIAGNOSES  Final diagnoses:  Fall in home, initial encounter  Lower abdominal pain      Kieley Akter, Dannielle Karvonen, PA-C 02/16/20 0009    Earleen Newport, MD 02/18/20 [phone]

## 2020-02-22 ENCOUNTER — Telehealth: Payer: Self-pay

## 2020-02-22 ENCOUNTER — Telehealth (INDEPENDENT_AMBULATORY_CARE_PROVIDER_SITE_OTHER): Payer: Self-pay | Admitting: Gastroenterology

## 2020-02-22 DIAGNOSIS — Z1211 Encounter for screening for malignant neoplasm of colon: Secondary | ICD-10-CM

## 2020-02-22 NOTE — Telephone Encounter (Signed)
Unable to contact patient for his telephone nurse visit call to schedule colonoscopy.  LVM on cell number.  Also tried to contact him on home number no answer, no voicemail.  Thanks,  [place], [place]

## 2020-03-10 NOTE — Progress Notes (Signed)
error 

## 2020-03-11 ENCOUNTER — Other Ambulatory Visit: Payer: Self-pay

## 2020-03-11 ENCOUNTER — Encounter (HOSPITAL_COMMUNITY): Payer: Self-pay | Admitting: Emergency Medicine

## 2020-03-11 ENCOUNTER — Emergency Department (HOSPITAL_COMMUNITY)
Admission: EM | Admit: 2020-03-11 | Discharge: 2020-03-12 | Disposition: A | Discharge status: Home / Self Care | Payer: Medicare Other | Attending: Emergency Medicine | Admitting: Emergency Medicine

## 2020-03-11 DIAGNOSIS — Y92002 Bathroom of unspecified non-institutional (private) residence single-family (private) house as the place of occurrence of the external cause: Secondary | ICD-10-CM | POA: Diagnosis not present

## 2020-03-11 DIAGNOSIS — I10 Essential (primary) hypertension: Secondary | ICD-10-CM | POA: Insufficient documentation

## 2020-03-11 DIAGNOSIS — Z7982 Long term (current) use of aspirin: Secondary | ICD-10-CM | POA: Insufficient documentation

## 2020-03-11 DIAGNOSIS — S50312A Abrasion of left elbow, initial encounter: Secondary | ICD-10-CM | POA: Insufficient documentation

## 2020-03-11 DIAGNOSIS — E875 Hyperkalemia: Secondary | ICD-10-CM | POA: Insufficient documentation

## 2020-03-11 DIAGNOSIS — Z79899 Other long term (current) drug therapy: Secondary | ICD-10-CM | POA: Diagnosis not present

## 2020-03-11 DIAGNOSIS — S01312A Laceration without foreign body of left ear, initial encounter: Secondary | ICD-10-CM | POA: Insufficient documentation

## 2020-03-11 DIAGNOSIS — W19XXXA Unspecified fall, initial encounter: Secondary | ICD-10-CM | POA: Diagnosis not present

## 2020-03-11 DIAGNOSIS — J449 Chronic obstructive pulmonary disease, unspecified: Secondary | ICD-10-CM | POA: Insufficient documentation

## 2020-03-11 DIAGNOSIS — Y999 Unspecified external cause status: Secondary | ICD-10-CM | POA: Insufficient documentation

## 2020-03-11 DIAGNOSIS — Z8546 Personal history of malignant neoplasm of prostate: Secondary | ICD-10-CM | POA: Insufficient documentation

## 2020-03-11 DIAGNOSIS — Z23 Encounter for immunization: Secondary | ICD-10-CM | POA: Diagnosis not present

## 2020-03-11 DIAGNOSIS — Z87891 Personal history of nicotine dependence: Secondary | ICD-10-CM | POA: Diagnosis not present

## 2020-03-11 DIAGNOSIS — G20 Parkinson's disease: Secondary | ICD-10-CM | POA: Insufficient documentation

## 2020-03-11 DIAGNOSIS — Y9389 Activity, other specified: Secondary | ICD-10-CM | POA: Insufficient documentation

## 2020-03-11 LAB — CBC WITH DIFFERENTIAL/PLATELET
Abs Immature Granulocytes: 0.02 10*3/uL (ref 0.00–0.07)
Basophils Absolute: 0 10*3/uL (ref 0.0–0.1)
Basophils Relative: 0 %
Eosinophils Absolute: 0 10*3/uL (ref 0.0–0.5)
Eosinophils Relative: 0 %
HCT: 45.1 % (ref 39.0–52.0)
Hemoglobin: 13.8 g/dL (ref 13.0–17.0)
Immature Granulocytes: 0 %
Lymphocytes Relative: 13 %
Lymphs Abs: 0.9 10*3/uL (ref 0.7–4.0)
MCH: 29.6 pg (ref 26.0–34.0)
MCHC: 30.6 g/dL (ref 30.0–36.0)
MCV: 96.6 fL (ref 80.0–100.0)
Monocytes Absolute: 0.6 10*3/uL (ref 0.1–1.0)
Monocytes Relative: 9 %
Neutro Abs: 5 10*3/uL (ref 1.7–7.7)
Neutrophils Relative %: 78 %
Platelets: 241 10*3/uL (ref 150–400)
RBC: 4.67 MIL/uL (ref 4.22–5.81)
RDW: 14.2 % (ref 11.5–15.5)
WBC: 6.5 10*3/uL (ref 4.0–10.5)
nRBC: 0 % (ref 0.0–0.2)

## 2020-03-11 LAB — COMPREHENSIVE METABOLIC PANEL
ALT: 5 U/L (ref 0–44)
AST: 15 U/L (ref 15–41)
Albumin: 4 g/dL (ref 3.5–5.0)
Alkaline Phosphatase: 62 U/L (ref 38–126)
Anion gap: 9 (ref 5–15)
BUN: 25 mg/dL — ABNORMAL HIGH (ref 8–23)
CO2: 26 mmol/L (ref 22–32)
Calcium: 9.1 mg/dL (ref 8.9–10.3)
Chloride: 103 mmol/L (ref 98–111)
Creatinine, Ser: 1 mg/dL (ref 0.61–1.24)
GFR calc Af Amer: >60 mL/min (ref >60)
GFR calc non Af Amer: >60 mL/min (ref >60)
Glucose, Bld: 114 mg/dL — ABNORMAL HIGH (ref 70–99)
Potassium: 5.2 mmol/L — ABNORMAL HIGH (ref 3.5–5.1)
Sodium: 138 mmol/L (ref 135–145)
Total Bilirubin: 0.5 mg/dL (ref 0.3–1.2)
Total Protein: 7.1 g/dL (ref 6.5–8.1)

## 2020-03-11 NOTE — ED Triage Notes (Signed)
Patient lost his balance and fell at home this evening , no LOC , presents with skin laceration at left outer ear , bleeding controlled , alert and oriented/no LOC , history of Parkinson's disease .

## 2020-03-12 MED ORDER — TETANUS-DIPHTH-ACELL PERTUSSIS 5-2.5-18.5 LF-MCG/0.5 IM SUSP
0.5000 mL | Freq: Once | INTRAMUSCULAR | Status: AC
  Administered 2020-03-12: 0.5 mL via INTRAMUSCULAR
  Filled 2020-03-12: qty 0.5

## 2020-03-12 MED ORDER — CEPHALEXIN 500 MG PO CAPS
500.0000 mg | ORAL_CAPSULE | Freq: Two times a day (BID) | ORAL | 0 refills | Status: AC
Start: 2020-03-12 — End: 2020-03-17

## 2020-03-12 MED ORDER — CEPHALEXIN 250 MG PO CAPS
500.0000 mg | ORAL_CAPSULE | Freq: Once | ORAL | Status: AC
  Administered 2020-03-12: 500 mg via ORAL
  Filled 2020-03-12: qty 2

## 2020-03-12 NOTE — ED Provider Notes (Signed)
°  Physical Exam  BP (!) 142/79    Pulse 92    Temp 98.6 F (37 C)    Resp 20    Ht 5\' 7"  (1.702 m)    Wt 105 kg    SpO2 99%    BMI 36.26 kg/m   Physical Exam Vitals and nursing note reviewed.  Constitutional:      General: He is not in acute distress.    Appearance: Normal appearance. He is well-developed. He is not ill-appearing.  HENT:     Head: Normocephalic and atraumatic.     Ears:     Comments: Laceration between the cartilage of the antihelix and antitragus Eyes:     General: No scleral icterus.       Right eye: No discharge.        Left eye: No discharge.     Conjunctiva/sclera: Conjunctivae normal.     Pupils: Pupils are equal, round, and reactive to light.  Cardiovascular:     Rate and Rhythm: Normal rate.  Pulmonary:     Effort: Pulmonary effort is normal. No respiratory distress.  Abdominal:     General: There is no distension.  Musculoskeletal:     Cervical back: Normal range of motion.  Skin:    General: Skin is warm and dry.  Neurological:     Mental Status: He is alert and oriented to person, place, and time.  Psychiatric:        Behavior: Behavior normal.     ED Course/Procedures     .Marland KitchenLaceration Repair  Date/Time: 03/12/2020 3:20 PM Performed by: Recardo Evangelist, PA-C Authorized by: Recardo Evangelist, PA-C   Consent:    Consent obtained:  Verbal   Consent given by:  Patient   Risks discussed:  Infection, pain, poor cosmetic result and poor wound healing   Alternatives discussed:  No treatment Anesthesia (see MAR for exact dosages):    Anesthesia method:  None Laceration details:    Location:  Ear   Ear location:  L ear   Length (cm):  3   Depth (mm):  10 Repair type:    Repair type:  Simple Pre-procedure details:    Preparation:  Patient was prepped and draped in usual sterile fashion Exploration:    Wound exploration: wound explored through full range of motion and entire depth of wound probed and visualized   Treatment:    Area  cleansed with:  Saline   Amount of cleaning:  Standard   Irrigation method:  Tap   Visualized foreign bodies/material removed: no   Skin repair:    Repair method:  Tissue adhesive Approximation:    Approximation:  Close Post-procedure details:    Dressing:  Open (no dressing)   Patient tolerance of procedure:  Tolerated well, no immediate complications    MDM         Recardo Evangelist, PA-C 03/12/20 1521    Sherwood Gambler, MD 03/12/20 1626

## 2020-03-12 NOTE — ED Provider Notes (Signed)
[facility] EMERGENCY DEPARTMENT Provider Note   CSN: [id number] Arrival date & time: 03/11/20  2019     History Chief Complaint  Patient presents with  . [place] Platten is a 74 y.o. male.  HPI 74 year old male presents with ear laceration and fall.  He fell last night while in the bathroom.  He has Parkinson's and whenever he gets in an enclosed space like a bathroom he states he tends to fall or get off balance because of the tight space.  Hit his ear on a structure, he thinks it was the sink.  Also has an elbow abrasion.  He never lost consciousness and denies any headache.  No new weakness.  Went to urgent care but was sent to the hospital because of the complexity of the ear laceration.  No blood thinner use.  No syncope/near syncope.   Past Medical History:  Diagnosis Date  . AAA (abdominal aortic aneurysm) without rupture ([place])   . AAA (abdominal aortic aneurysm) without rupture ([place])   . Cancer [facility])    prostate cancer  . Chest pain   . COPD (chronic obstructive pulmonary disease) ([place])   . Depression   . Dizziness and giddiness   . Dvt femoral (deep venous thrombosis) (HCC)    H/O LEG  . GERD (gastroesophageal reflux disease)   . Heart disease   . Hyperlipidemia   . Hypertension   . Parkinson disease ([place])   . Prostate cancer ([place])   . Shortness of breath dyspnea    DOE  . Shoulder fracture, right   . Stroke [facility])    tia    Patient Active Problem List   Diagnosis Date Noted  . Shoulder pain 08/11/2017  . Urinary urgency 08/11/2017  . Venous insufficiency of both lower extremities 10/25/2016  . Bilateral leg edema 10/19/2016  . Prostate cancer ([place]) 09/27/2016  . Essential hypertension 10/29/2015  . Hyperlipidemia 10/29/2015  . Esophageal reflux 10/29/2015  . Centrilobular emphysema ([place]) 10/29/2015  . Labyrinthitis 10/29/2015  . Microhematuria 09/29/2015  . Femoral artery aneurysm, left ([place]) 06/22/2015  . Cerebral vascular  disease 06/21/2015  . AAA (abdominal aortic aneurysm) ([place]) 05/21/2015  . Degenerative disc disease, lumbar 04/24/2015  . Urinary incontinence, male, stress 04/24/2015  . Dizziness 01/20/2015  . Endocarditis 01/20/2015  . Aneurysm of abdominal vessel ([place]) 08/30/2014  . Chronic obstructive pulmonary disease ([place]) 08/30/2014  . Personal history of transient ischemic attack (TIA), and cerebral infarction without residual deficits 08/30/2014    Past Surgical History:  Procedure Laterality Date  . CATARACT EXTRACTION W/PHACO Left 09/27/2018   Procedure: CATARACT EXTRACTION PHACO AND INTRAOCULAR LENS PLACEMENT ([place]) LEFT;  Surgeon: Leandrew Koyanagi, MD;  Location: [facility] ORS;  Service: Ophthalmology;  Laterality: Left;  [place]  00:42 AP% 15.1 CDE 5.16 Fluid pack lot # [id number]  . CATARACT EXTRACTION W/PHACO Right 02/28/2019   Procedure: CATARACT EXTRACTION PHACO AND INTRAOCULAR LENS PLACEMENT ([place]) RIGHT MALYUGIN;  Surgeon: Leandrew Koyanagi, MD;  Location: [place];  Service: Ophthalmology;  Laterality: Right;  . COLONOSCOPY  2010  . EMBOLECTOMY Left 06/20/2015   Procedure: EMBOLECTOMY;  Surgeon: [name], MD;  Location: [facility] ORS;  Service: Vascular;  Laterality: Left;  brachial thrombectomy  . ENDARTERECTOMY FEMORAL Left 06/20/2015   Procedure: ENDARTERECTOMY FEMORAL/FEMORAL PSEUDOANEURYSM repair;  Surgeon: [name], MD;  Location: [facility] ORS;  Service: Vascular;  Laterality: Left;  femerol  . FRACTURE SURGERY Right    SHOULDER  . INNER  EAR SURGERY    . MANDIBLE SURGERY     TUMOR  . PERIPHERAL VASCULAR CATHETERIZATION N/A 05/21/2015   Procedure: Endovascular Repair/Stent Graft;  Surgeon: [name], MD;  Location: [place] CV [facility];  Service: Cardiovascular;  Laterality: N/A;  . PERIPHERAL VASCULAR CATHETERIZATION  06/20/2015   Procedure: Lower Extremity Angiography;  Surgeon: [name], MD;  Location: [place] CV LAB;  Service:  Cardiovascular;;  . PERIPHERAL VASCULAR CATHETERIZATION  06/20/2015   Procedure: Lower Extremity Intervention;  Surgeon: [name], MD;  Location: [place] CV LAB;  Service: Cardiovascular;;       Family History  Problem Relation Age of Onset  . Prostate cancer Brother   . Bladder Cancer Neg Hx   . Kidney cancer Neg Hx     Social History   Tobacco Use  . Smoking status: Former Smoker    Packs/day: 1.50    Years: 48.00    Pack years: 72.00    Quit date: 10/10/2013    Years since quitting: 6.4  . Smokeless tobacco: Never Used  Vaping Use  . Vaping Use: Never used  Substance Use Topics  . Alcohol use: No  . Drug use: No    Home Medications Prior to Admission medications   Medication Sig Start Date End Date Taking? Authorizing Provider  aspirin 81 MG tablet Take 1 tablet (81 mg total) by mouth daily. 03/17/18  Yes Juline Patch, MD  atorvastatin (LIPITOR) 10 MG tablet TAKE 1 TABLET BY MOUTH DAILY Patient taking differently: Take 10 mg by mouth every evening.  02/11/20  Yes Juline Patch, MD  carbidopa-levodopa (SINEMET IR) 25-100 MG tablet Take 1.5 tablets by mouth 3 (three) times daily. Dr Manuella Ghazi   Yes [provider]  traZODone (DESYREL) 50 MG tablet Take 1 tablet (50 mg total) by mouth at bedtime. Patient taking differently: Take 50 mg by mouth at bedtime as needed for sleep.  02/11/20  Yes Juline Patch, MD  brimonidine-timolol (COMBIGAN) 0.2-0.5 % ophthalmic solution Place 1 drop into both eyes every 12 (twelve) hours.    [provider]  cephALEXin (KEFLEX) 500 MG capsule Take 1 capsule (500 mg total) by mouth 2 (two) times daily for 5 days. 03/12/20 03/17/20  Sherwood Gambler, MD  Cholecalciferol (VITAMIN D3) 1000 UNITS CAPS Take 1,000 Units by mouth daily.     [provider]  dorzolamide (TRUSOPT) 2 % ophthalmic solution Place 1 drop into both eyes 2 (two) times daily.  07/30/16   [provider]  doxycycline (PERIOSTAT)  20 MG tablet Take 20 mg by mouth 2 (two) times daily. dentist    [provider]  famotidine (PEPCID) 40 MG tablet Take 1 tablet (40 mg total) by mouth daily. 02/11/20   Juline Patch, MD  latanoprost (XALATAN) 0.005 % ophthalmic solution Place 1 drop into both eyes at bedtime.     [provider]  Multiple Vitamins-Minerals (PRESERVISION AREDS PO) Take 1 capsule by mouth 2 (two) times daily.    [provider]  sertraline (ZOLOFT) 50 MG tablet Take 1.5 tablets (75 mg total) by mouth daily. 02/11/20   Juline Patch, MD  tamsulosin (FLOMAX) 0.4 MG CAPS capsule TAKE 1 CAPSULE BY MOUTH ONCE DAILY 12/13/19   Stoioff, Ronda Fairly, MD  valsartan (DIOVAN) 80 MG tablet TAKE 1 TABLET (80 MG TOTAL) BY MOUTH ONCE DAILY. Patient taking differently: Take 80 mg by mouth daily.  03/17/18   Juline Patch, MD  vitamin B-12 (CYANOCOBALAMIN) 1000 MCG tablet Take 1,000 mcg by mouth daily.    [provider]    Allergies    Patient has no known allergies.  Review of Systems   Review of Systems  HENT: Positive for ear pain.   Musculoskeletal: Negative for arthralgias and neck pain.  Skin: Positive for wound.  Neurological: Negative for headaches.  All other systems reviewed and are negative.   Physical Exam Updated Vital Signs BP (!) 151/82 (BP Location: Right Arm)   Pulse 87   Temp 98.6 F (37 C) (Oral)   Resp 18   Ht 5\' 7"  (1.702 m)   Wt 105 kg   SpO2 98%   BMI 36.26 kg/m   Physical Exam Vitals and nursing note reviewed.  Constitutional:      Appearance: He is well-developed.  HENT:     Head: Normocephalic.     Right Ear: External ear normal.     Ears:     Comments: See picture. Has laceration to ear that does not extend into the canal. The canal itself seems to have some debris, but no pain.     Nose: Nose normal.  Eyes:     General:        Right eye: No discharge.        Left eye: No discharge.     Extraocular Movements: Extraocular movements intact.       Pupils: Pupils are equal, round, and reactive to light.  Cardiovascular:     Rate and Rhythm: Normal rate and regular rhythm.     Heart sounds: Normal heart sounds.  Pulmonary:     Effort: Pulmonary effort is normal.     Breath sounds: Normal breath sounds.  Abdominal:     Palpations: Abdomen is soft.     Tenderness: There is no abdominal tenderness.  Musculoskeletal:     Cervical back: Neck supple.     Comments: Abrasion to left elbow. Normal ROM, no tenderness  Skin:    General: Skin is warm and dry.  Neurological:     Mental Status: He is alert.     Comments: CN 3-12 grossly intact. 5/5 strength in all 4 extremities. Grossly normal sensation. Normal finger to nose. Mild tremor  Psychiatric:        Mood and Affect: Mood is not anxious.           ED Results / Procedures / Treatments   Labs (all labs ordered are listed, but only abnormal results are displayed) Labs Reviewed  COMPREHENSIVE METABOLIC PANEL - Abnormal; Notable for the following components:      Result Value   Potassium 5.2 (*)    Glucose, Bld 114 (*)    BUN 25 (*)    All other components within normal limits  CBC WITH DIFFERENTIAL/PLATELET    EKG None  Radiology No results found.  Procedures Procedures (including critical care time)  Medications Ordered in ED Medications  Tdap (BOOSTRIX) injection 0.5 mL (0.5 mLs Intramuscular Given 03/12/20 1434)  cephALEXin (KEFLEX) capsule 500 mg (500 mg Oral Given 03/12/20 1436)    ED Course  I have reviewed the triage vital signs and the nursing notes.  Pertinent labs & imaging results that were available during my care of the patient were reviewed by me and considered in my medical decision making (see chart for details).    MDM Rules/Calculators/A&P  Patient does not want to get a head CT.  It has been many hours since his fall as he waited in the waiting room overnight.  I think this is pretty reasonable given no  headache and benign neuro exam.  He also declines elbow x-ray.  His tetanus was updated.  Discussed the complexity of the ear laceration with Dr. Wilburn Cornelia given it appears it went through the cartilage.  He recommends glue since it seems to well approximate otherwise.  Please see other note for this procedure.  Otherwise, he recommends some antibiotics and follow-up in his office next week for wound check.  Patient tolerated this well and appears stable for discharge home.  He is noted to have mildly high potassium of 5.2 on the labs obtained in triage.  He is on valsartan so I have asked him to hold this and follow-up with his PCP. Final Clinical Impression(s) / ED Diagnoses Final diagnoses:  Fall, initial encounter  Complex laceration of ear, left, initial encounter  Abrasion of left elbow, initial encounter  Hyperkalemia    Rx / DC Orders ED Discharge Orders         Ordered    cephALEXin (KEFLEX) 500 MG capsule  2 times daily     Discontinue  Reprint     03/12/20 [place], Bell Cai, MD 03/12/20 1616

## 2020-03-12 NOTE — Discharge Instructions (Addendum)
Do not take your valsartan/Diovan tomorrow.  Recheck with your doctor to have your labs rechecked as your potassium was mildly elevated at 5.2.

## 2020-03-12 NOTE — ED Notes (Signed)
Patient Alert and oriented to baseline. Stable and ambulatory to baseline. Patient verbalized understanding of the discharge instructions.  Patient belongings were taken by the patient.   

## 2020-03-13 ENCOUNTER — Telehealth: Payer: Self-pay

## 2020-03-13 ENCOUNTER — Emergency Department
Admission: EM | Admit: 2020-03-13 | Discharge: 2020-03-14 | Disposition: A | Discharge status: Nursing Home (Includes ICF) | Payer: Medicare Other | Attending: Emergency Medicine | Admitting: Emergency Medicine

## 2020-03-13 ENCOUNTER — Encounter: Payer: Self-pay | Admitting: Emergency Medicine

## 2020-03-13 ENCOUNTER — Emergency Department: Payer: Medicare Other

## 2020-03-13 ENCOUNTER — Other Ambulatory Visit: Payer: Self-pay

## 2020-03-13 DIAGNOSIS — R5383 Other fatigue: Secondary | ICD-10-CM | POA: Diagnosis not present

## 2020-03-13 DIAGNOSIS — M6281 Muscle weakness (generalized): Secondary | ICD-10-CM | POA: Diagnosis not present

## 2020-03-13 DIAGNOSIS — J449 Chronic obstructive pulmonary disease, unspecified: Secondary | ICD-10-CM | POA: Insufficient documentation

## 2020-03-13 DIAGNOSIS — I10 Essential (primary) hypertension: Secondary | ICD-10-CM | POA: Insufficient documentation

## 2020-03-13 DIAGNOSIS — R627 Adult failure to thrive: Secondary | ICD-10-CM | POA: Diagnosis present

## 2020-03-13 DIAGNOSIS — G20 Parkinson's disease: Secondary | ICD-10-CM | POA: Insufficient documentation

## 2020-03-13 DIAGNOSIS — Z87891 Personal history of nicotine dependence: Secondary | ICD-10-CM | POA: Diagnosis not present

## 2020-03-13 DIAGNOSIS — Z79899 Other long term (current) drug therapy: Secondary | ICD-10-CM | POA: Diagnosis not present

## 2020-03-13 DIAGNOSIS — Z7982 Long term (current) use of aspirin: Secondary | ICD-10-CM | POA: Insufficient documentation

## 2020-03-13 DIAGNOSIS — R600 Localized edema: Secondary | ICD-10-CM | POA: Diagnosis not present

## 2020-03-13 DIAGNOSIS — R296 Repeated falls: Secondary | ICD-10-CM

## 2020-03-13 DIAGNOSIS — R531 Weakness: Secondary | ICD-10-CM

## 2020-03-13 DIAGNOSIS — Z20822 Contact with and (suspected) exposure to covid-19: Secondary | ICD-10-CM | POA: Diagnosis not present

## 2020-03-13 LAB — URINALYSIS, COMPLETE (UACMP) WITH MICROSCOPIC
Bacteria, UA: NONE SEEN
Bilirubin Urine: NEGATIVE
Glucose, UA: NEGATIVE mg/dL
Hgb urine dipstick: NEGATIVE
Ketones, ur: NEGATIVE mg/dL
Leukocytes,Ua: NEGATIVE
Nitrite: NEGATIVE
Protein, ur: NEGATIVE mg/dL
Specific Gravity, Urine: 1.011 (ref 1.005–1.030)
pH: 5 (ref 5.0–8.0)

## 2020-03-13 LAB — CBC
HCT: 41.2 % (ref 39.0–52.0)
Hemoglobin: 13.4 g/dL (ref 13.0–17.0)
MCH: 29.6 pg (ref 26.0–34.0)
MCHC: 32.5 g/dL (ref 30.0–36.0)
MCV: 91.2 fL (ref 80.0–100.0)
Platelets: 197 10*3/uL (ref 150–400)
RBC: 4.52 MIL/uL (ref 4.22–5.81)
RDW: 14.3 % (ref 11.5–15.5)
WBC: 5.9 10*3/uL (ref 4.0–10.5)
nRBC: 0 % (ref 0.0–0.2)

## 2020-03-13 LAB — BASIC METABOLIC PANEL
Anion gap: 10 (ref 5–15)
BUN: 23 mg/dL (ref 8–23)
CO2: 25 mmol/L (ref 22–32)
Calcium: 9 mg/dL (ref 8.9–10.3)
Chloride: 102 mmol/L (ref 98–111)
Creatinine, Ser: 0.79 mg/dL (ref 0.61–1.24)
GFR calc Af Amer: >60 mL/min (ref >60)
GFR calc non Af Amer: >60 mL/min (ref >60)
Glucose, Bld: 126 mg/dL — ABNORMAL HIGH (ref 70–99)
Potassium: 4.3 mmol/L (ref 3.5–5.1)
Sodium: 137 mmol/L (ref 135–145)

## 2020-03-13 LAB — TROPONIN I (HIGH SENSITIVITY): Troponin I (High Sensitivity): 4 ng/L (ref <18)

## 2020-03-13 LAB — BRAIN NATRIURETIC PEPTIDE: B Natriuretic Peptide: 29.8 pg/mL (ref 0.0–100.0)

## 2020-03-13 NOTE — ED Triage Notes (Signed)
Pt here via ems from home with c/o FTT, unable to care for himself at home anymore, lives alone, has parkinson's disease. Has been feeling more weak recently, fell at home a few days ago, dried blood on shirt from fall, states he hit the commode at fall, dried blood on left ear as well. Is unsure of blood thinner use. [facility] and treated. States he is helpless and hasn't even changed his clothes in days. NAD.

## 2020-03-13 NOTE — Telephone Encounter (Signed)
Spoke to [place] concerning pt- he has had a decline in health since being discharged from hospital yesterday. He is unable to get up by himself and go to the bathroom. She was told to take him back to the hospital and let them re-evaluate him and then they need to ask for pt to be discharged to a rehab facility.

## 2020-03-13 NOTE — ED Triage Notes (Signed)
First Nurse Note:  Arrives via [facility] -- patient fell at home.  C/O generalized weakness.  Lives home alone.  Per EMS house is unkempt with trash. Floors not clear to walk.  Per report, patient reported sitting in chair for 1.5 days, since last release from hosptial.  VS wnl.  NAD

## 2020-03-13 NOTE — ED Provider Notes (Signed)
[facility] Emergency Department Provider Note   ____________________________________________   First MD Initiated Contact with Patient 03/13/20 2208     (approximate)  I have reviewed the triage vital signs and the nursing notes.   HISTORY  Chief Complaint Failure To Thrive and Fatigue    HPI Howard Anderson is a 74 y.o. male who lives by himself and still works.  He says he has Parkinson's disease and is getting more more unsteady and falling a lot.  Additionally there are times when he cannot move at all.  He feels weak.  He thinks he needs assisted living.  EMS reports that the house is uncapped and the floor is not clear to walk on.  Patient reported he was sitting in a chair for a day and a half since his last release from the hospital he had not been able to get up.      Past Medical History:  Diagnosis Date  . AAA (abdominal aortic aneurysm) without rupture ([place])   . AAA (abdominal aortic aneurysm) without rupture ([place])   . Cancer [facility])    prostate cancer  . Chest pain   . COPD (chronic obstructive pulmonary disease) ([place])   . Depression   . Dizziness and giddiness   . Dvt femoral (deep venous thrombosis) ([facility])    H/O LEG  . GERD (gastroesophageal reflux disease)   . Heart disease   . Hyperlipidemia   . Hypertension   . Parkinson disease ([place])   . Prostate cancer ([place])   . Shortness of breath dyspnea    DOE  . Shoulder fracture, right   . Stroke [facility])    tia    Patient Active Problem List   Diagnosis Date Noted  . Shoulder pain 08/11/2017  . Urinary urgency 08/11/2017  . Venous insufficiency of both lower extremities 10/25/2016  . Bilateral leg edema 10/19/2016  . Prostate cancer ([place]) 09/27/2016  . Essential hypertension 10/29/2015  . Hyperlipidemia 10/29/2015  . Esophageal reflux 10/29/2015  . Centrilobular emphysema ([place]) 10/29/2015  . Labyrinthitis 10/29/2015  . Microhematuria 09/29/2015  . Femoral artery aneurysm,  left ([place]) 06/22/2015  . Cerebral vascular disease 06/21/2015  . AAA (abdominal aortic aneurysm) ([place]) 05/21/2015  . Degenerative disc disease, lumbar 04/24/2015  . Urinary incontinence, male, stress 04/24/2015  . Dizziness 01/20/2015  . Endocarditis 01/20/2015  . Aneurysm of abdominal vessel ([place]) 08/30/2014  . Chronic obstructive pulmonary disease ([place]) 08/30/2014  . Personal history of transient ischemic attack (TIA), and cerebral infarction without residual deficits 08/30/2014    Past Surgical History:  Procedure Laterality Date  . CATARACT EXTRACTION W/PHACO Left 09/27/2018   Procedure: CATARACT EXTRACTION PHACO AND INTRAOCULAR LENS PLACEMENT ([place]) LEFT;  Surgeon: Leandrew Koyanagi, MD;  Location: [facility] OR[facility];  Service: Ophthalmology;  Laterality: Left;  [place]  00:42 AP% 15.1 CDE 5.16 Fluid pack lot # [id number]  . CATARACT EXTRACTION W/PHACO Right 02/28/2019   Procedure: CATARACT EXTRACTION PHACO AND INTRAOCULAR LENS PLACEMENT ([place]) RIGHT MALYUGIN;  Surgeon: Leandrew Koyanagi, MD;  Location: [place];  Service: Ophthalmology;  Laterality: Right;  . COLONOSCOPY  2010  . EMBOLECTOMY Left 06/20/2015   Procedure: EMBOLECTOMY;  Surgeon: [name], MD;  Location: [facility] ORS;  Service: Vascular;  Laterality: Left;  brachial thrombectomy  . ENDARTERECTOMY FEMORAL Left 06/20/2015   Procedure: ENDARTERECTOMY FEMORAL/FEMORAL PSEUDOANEURYSM repair;  Surgeon: [name], MD;  Location: [facility] OR[facility];  Service: Vascular;  Laterality: Left;  femerol  . FRACTURE SURGERY Right  SHOULDER  . INNER EAR SURGERY    . MANDIBLE SURGERY     TUMOR  . PERIPHERAL VASCULAR CATHETERIZATION N/A 05/21/2015   Procedure: Endovascular Repair/Stent Graft;  Surgeon: [name], MD;  Location: [place] [facility];  Service: Cardiovascular;  Laterality: N/A;  . PERIPHERAL VASCULAR CATHETERIZATION  06/20/2015   Procedure: Lower Extremity Angiography;  Surgeon: [name], MD;   Location: [place] CV [facility];  Service: Cardiovascular;;  . PERIPHERAL VASCULAR CATHETERIZATION  06/20/2015   Procedure: Lower Extremity Intervention;  Surgeon: [name], MD;  Location: [place] [facility];  Service: Cardiovascular;;    Prior to Admission medications   Medication Sig Start Date End Date Taking? Authorizing Provider  aspirin 81 MG tablet Take 1 tablet (81 mg total) by mouth daily. 03/17/18   Juline Patch, MD  atorvastatin (LIPITOR) 10 MG tablet TAKE 1 TABLET BY MOUTH DAILY Patient taking differently: Take 10 mg by mouth every evening.  02/11/20   Juline Patch, MD  brimonidine-timolol (COMBIGAN) 0.2-0.5 % ophthalmic solution Place 1 drop into both eyes every 12 (twelve) hours.    [provider]  carbidopa-levodopa (SINEMET IR) 25-100 MG tablet Take 1.5 tablets by mouth 3 (three) times daily. Dr Manuella Ghazi    [provider]  cephALEXin (KEFLEX) 500 MG capsule Take 1 capsule (500 mg total) by mouth 2 (two) times daily for 5 days. 03/12/20 03/17/20  Sherwood Gambler, MD  Cholecalciferol (VITAMIN D3) 1000 UNITS CAPS Take 1,000 Units by mouth daily.     [provider]  dorzolamide (TRUSOPT) 2 % ophthalmic solution Place 1 drop into both eyes 2 (two) times daily.  07/30/16   [provider]  doxycycline (PERIOSTAT) 20 MG tablet Take 20 mg by mouth 2 (two) times daily. dentist    [provider]  famotidine (PEPCID) 40 MG tablet Take 1 tablet (40 mg total) by mouth daily. 02/11/20   Juline Patch, MD  latanoprost (XALATAN) 0.005 % ophthalmic solution Place 1 drop into both eyes at bedtime.     [provider]  Multiple Vitamins-Minerals (PRESERVISION AREDS PO) Take 1 capsule by mouth 2 (two) times daily.    [provider]  sertraline (ZOLOFT) 50 MG tablet Take 1.5 tablets (75 mg total) by mouth daily. 02/11/20   Juline Patch, MD  tamsulosin (FLOMAX) 0.4 MG CAPS capsule TAKE 1 CAPSULE BY MOUTH ONCE DAILY 12/13/19    Stoioff, Ronda Fairly, MD  traZODone (DESYREL) 50 MG tablet Take 1 tablet (50 mg total) by mouth at bedtime. Patient taking differently: Take 50 mg by mouth at bedtime as needed for sleep.  02/11/20   Juline Patch, MD  valsartan (DIOVAN) 80 MG tablet TAKE 1 TABLET (80 MG TOTAL) BY MOUTH ONCE DAILY. Patient taking differently: Take 80 mg by mouth daily.  03/17/18   Juline Patch, MD  vitamin B-12 (CYANOCOBALAMIN) 1000 MCG tablet Take 1,000 mcg by mouth daily.    [provider]    Allergies Patient has no known allergies.  Family History  Problem Relation Age of Onset  . Prostate cancer Brother   . Bladder Cancer Neg Hx   . Kidney cancer Neg Hx     Social History Social History   Tobacco Use  . Smoking status: Former Smoker    Packs/day: 1.50    Years: 48.00    Pack years: 72.00    Quit date: 10/10/2013    Years since quitting: 6.4  . Smokeless  tobacco: Never Used  Vaping Use  . Vaping Use: Never used  Substance Use Topics  . Alcohol use: No  . Drug use: No    Review of Systems  Constitutional: No fever/chills Eyes: No visual changes. ENT: No sore throat. Cardiovascular: Denies chest pain. Respiratory: Denies shortness of breath. Gastrointestinal: No abdominal pain.  No nausea, no vomiting.  No diarrhea.  No constipation. Genitourinary: Negative for dysuria. Musculoskeletal: Negative for back pain. Skin: Negative for rash. Neurological: Negative for headaches, focal weakness  ____________________________________________   PHYSICAL EXAM:  VITAL SIGNS: ED Triage Vitals  Enc Vitals Group     BP 03/13/20 1632 128/75     Pulse Rate 03/13/20 1632 88     Resp 03/13/20 1632 16     Temp 03/13/20 1632 98.2 F (36.8 C)     Temp Source 03/13/20 1632 Oral     SpO2 03/13/20 1632 94 %     Weight 03/13/20 1634 215 lb (97.5 kg)     Height 03/13/20 1634 5\' 7"  (1.702 m)     Head Circumference --      Peak Flow --      Pain Score --      Pain Loc --      Pain  Edu? --      Excl. in [place]? --     Constitutional: Alert and oriented. Well appearing and in no acute distress. Eyes: Conjunctivae are normal. PER. EOMI. Head: Atraumatic. Nose: No congestion/rhinnorhea. Mouth/Throat: Mucous membranes are moist.  Oropharynx non-erythematous. Neck: No stridor.   Cardiovascular: Normal rate, regular rhythm. Grossly normal heart sounds.  Good peripheral circulation. Respiratory: Normal respiratory effort.  No retractions. Lungs CTAB. Gastrointestinal: Soft and nontender.  Mild distention. No abdominal bruits.  Musculoskeletal: No lower extremity tenderness bilateral 2+ edema.   Neurologic:  Normal speech and language. No gross focal neurologic deficits are appreciated.  Skin:  Skin is warm, dry and intact. No rash noted.   ____________________________________________   LABS (all labs ordered are listed, but only abnormal results are displayed)  Labs Reviewed  BASIC METABOLIC PANEL - Abnormal; Notable for the following components:      Result Value   Glucose, Bld 126 (*)    All other components within normal limits  URINALYSIS, COMPLETE (UACMP) WITH MICROSCOPIC - Abnormal; Notable for the following components:   Color, Urine YELLOW (*)    APPearance CLEAR (*)    All other components within normal limits  CBC  TSH  HEPATIC FUNCTION PANEL  BRAIN NATRIURETIC PEPTIDE  TROPONIN I (HIGH SENSITIVITY)   ____________________________________________  EKG EKG read interpreted by me shows normal sinus rhythm rate of 89 normal axis no acute changes _______________________________________  RADIOLOGY  ED MD interpretation chest x-ray read by radiology reviewed by me is negative  Official radiology report(s): DG Chest Portable 1 View  Result Date: 03/13/2020 CLINICAL DATA:  Generalized weakness EXAM: PORTABLE CHEST 1 VIEW COMPARISON:  02/15/2020 FINDINGS: Lungs are well expanded, symmetric, and clear. No pneumothorax or pleural effusion. Cardiac size  within normal limits. Pulmonary vascularity is normal. Osseous structures are age-appropriate. Multiple healed right rib fractures are identified. Degenerative changes are noted within the right shoulder. No acute bone abnormality. IMPRESSION: No active disease. Electronically Signed   By: Fidela Salisbury MD   On: 03/13/2020 22:34    ____________________________________________   PROCEDURES  Procedure(s) performed (including Critical Care):  Procedures   ____________________________________________   INITIAL IMPRESSION / ASSESSMENT AND PLAN / ED COURSE  Patient unable  to care for himself at home.  We will medically clear him and then see if we can consult PT and social work and case management and get him in into a better living situation.             ____________________________________________   FINAL CLINICAL IMPRESSION(S) / ED DIAGNOSES  Final diagnoses:  Generalized weakness  Frequent falls     ED Discharge Orders    None       Note:  This document was prepared using Dragon voice recognition software and may include unintentional dictation errors.    Nena Polio, MD 03/13/20 [phone]

## 2020-03-14 LAB — HEPATIC FUNCTION PANEL
ALT: 21 U/L (ref 0–44)
AST: 17 U/L (ref 15–41)
Albumin: 4 g/dL (ref 3.5–5.0)
Alkaline Phosphatase: 57 U/L (ref 38–126)
Bilirubin, Direct: 0.3 mg/dL — ABNORMAL HIGH (ref 0.0–0.2)
Indirect Bilirubin: 1.2 mg/dL — ABNORMAL HIGH (ref 0.3–0.9)
Total Bilirubin: 1.5 mg/dL — ABNORMAL HIGH (ref 0.3–1.2)
Total Protein: 6.9 g/dL (ref 6.5–8.1)

## 2020-03-14 LAB — SARS CORONAVIRUS 2 BY RT PCR (HOSPITAL ORDER, PERFORMED IN ~~LOC~~ HOSPITAL LAB): SARS Coronavirus 2: NEGATIVE

## 2020-03-14 LAB — TSH: TSH: 0.982 u[IU]/mL (ref 0.350–4.500)

## 2020-03-14 MED ORDER — CIPROFLOXACIN HCL 500 MG PO TABS
500.0000 mg | ORAL_TABLET | Freq: Two times a day (BID) | ORAL | 0 refills | Status: AC
Start: 2020-03-14 — End: 2020-03-19

## 2020-03-14 MED ORDER — CIPROFLOXACIN HCL 500 MG PO TABS
500.0000 mg | ORAL_TABLET | Freq: Two times a day (BID) | ORAL | Status: DC
  Administered 2020-03-14: 500 mg via ORAL
  Filled 2020-03-14: qty 1

## 2020-03-14 MED ORDER — CIPROFLOXACIN-DEXAMETHASONE 0.3-0.1 % OT SUSP
4.0000 [drp] | Freq: Once | OTIC | Status: AC
  Administered 2020-03-14: 4 [drp] via OTIC
  Filled 2020-03-14: qty 7.5

## 2020-03-14 NOTE — ED Notes (Signed)
Assisted pt with urinal. No other needs at this time.

## 2020-03-14 NOTE — ED Notes (Signed)
Pt given breakfast tray

## 2020-03-14 NOTE — TOC Transition Note (Addendum)
Transition of Care [facility]) - CM/SW Discharge Note   Patient Details  Name: Howard Anderson MRN: [id number] Date of Birth: 1945/12/31  Transition of Care [facility]) CM/SW Contact:  Anselm Pancoast, RN Phone Number: 03/14/2020, 4:05 PM   Clinical Narrative:     Damaris Schooner to [facility] @ [facility]. States patient is clear to admit today to room 41. Kenney Houseman has already spoken to family and cleared all needed information from them. Will need EMS for transport. EDP and ED RN notified.   Received call from [place] states patient will need COVID test prior to transfer but can still admit this afternoon after test complete.      Barriers to Discharge: No Barriers Identified   Patient Goals and CMS Choice Patient states their goals for this hospitalization and ongoing recovery are:: Seeking SNF placement CMS Medicare.gov Compare Post Acute Care list provided to:: Patient Choice offered to / list presented to : Patient  Discharge Placement                       Discharge Plan and Services   Discharge Planning Services: CM Consult                                 Social Determinants of Health (SDOH) Interventions     Readmission Risk Interventions No flowsheet data found.

## 2020-03-14 NOTE — TOC Initial Note (Signed)
Transition of Care [facility]) - Initial/Assessment Note    Patient Details  Name: Howard Anderson MRN: [id number] Date of Birth: Jun 28, 1946  Transition of Care [facility]) CM/SW Contact:    Anselm Pancoast, RN Phone Number: 03/14/2020, 12:57 PM  Clinical Narrative:                 Damaris Schooner with patient and patient representatve, Judeen Hammans who is a close family friend. Discussed process for SNF placement and need for 3 day waiver. Patient lives alone and continues to work as a Engineer, maintenance (IT) however has been having increased falls and weakness recently. Was diagnosed with Parkinsons Disease 2 years ago and was active with PT prior to [place]. Patient is optimistic for short rehab stay and returning to his home and back to work as soon as possible.   Expected Discharge Plan: Skilled Nursing Facility Barriers to Discharge: No Barriers Identified   Patient Goals and CMS Choice Patient states their goals for this hospitalization and ongoing recovery are:: Seeking SNF placement CMS Medicare.gov Compare Post Acute Care list provided to:: Patient Choice offered to / list presented to : Patient  Expected Discharge Plan and Services Expected Discharge Plan: [place]   Discharge Planning Services: CM Consult   Living arrangements for the past 2 months: Single Family Home                                      Prior Living Arrangements/Services Living arrangements for the past 2 months: Single Family Home Lives with:: Self Patient language and need for interpreter reviewed:: Yes Do you feel safe going back to the place where you live?: No   Needing rehab  Need for Family Participation in Patient Care: Yes (Comment) Care giver support system in place?: Yes (comment) Current home services: DME (walker, wheelchair) Criminal Activity/Legal Involvement Pertinent to Current Situation/Hospitalization: No - Comment as needed  Activities of Daily Living      Permission Sought/Granted Permission  sought to share information with : Facility Sport and exercise psychologist, Case Manager, Family Supports Permission granted to share information with : Yes, Verbal Permission Granted  Share Information with NAME: [facility]  Permission granted to share info w AGENCY: [place] granted to share info w Relationship: Isabella Bowens  Permission granted to share info w Contact Information: [phone]  Emotional Assessment   Attitude/Demeanor/Rapport: Engaged Affect (typically observed): Accepting Orientation: : Oriented to Self, Oriented to Place, Oriented to  Time, Oriented to Situation Alcohol / Substance Use: Not Applicable Psych Involvement: No (comment)  Admission diagnosis:  Weakness, Failure to thrive Patient Active Problem List   Diagnosis Date Noted  . Shoulder pain 08/11/2017  . Urinary urgency 08/11/2017  . Venous insufficiency of both lower extremities 10/25/2016  . Bilateral leg edema 10/19/2016  . Prostate cancer ([place]) 09/27/2016  . Essential hypertension 10/29/2015  . Hyperlipidemia 10/29/2015  . Esophageal reflux 10/29/2015  . Centrilobular emphysema ([place]) 10/29/2015  . Labyrinthitis 10/29/2015  . Microhematuria 09/29/2015  . 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 Cerebral vascular disease 06/21/2015  . AAA (abdominal aortic aneurysm) ([place]) 05/21/2015  . Degenerative disc disease, lumbar 04/24/2015  . Urinary incontinence, male, stress 04/24/2015  . Dizziness 01/20/2015  . Endocarditis 01/20/2015  . Aneurysm of abdominal vessel ([place]) 08/30/2014  . Chronic obstructive pulmonary disease ([place]) 08/30/2014  . Personal history of transient ischemic attack (TIA), and cerebral infarction without  residual deficits 08/30/2014   PCP:  Juline Patch, MD Pharmacy:   [place], [place] - [place] [address] Phone: [phone] Fax: [phone]     Social Determinants of Health (SDOH)  Interventions    Readmission Risk Interventions No flowsheet data found.

## 2020-03-14 NOTE — TOC Initial Note (Addendum)
Transition of Care [facility]) - Initial/Assessment Note    Patient Details  Name: Howard Anderson MRN: [id number] Date of Birth: 01-Sep-1945  Transition of [facility]) CM/SW Contact:    Anselm Pancoast, RN Phone Number: 03/14/2020, 11:53 AM  Clinical Narrative:                 Received message from Isabella Bowens [phone] who is caregiver for patient requesting callback to discuss discharge needs. States patient is widowed and has no family support. States his brother is currently on Comfort measures. Patient is requesting rehab.   RN CM left message for Judeen Hammans to call back. RN CM will follow for PT recommendations and transition as appropriate. Patient will require 3 day waiver.        Patient Goals and CMS Choice        Expected Discharge Plan and Services                                                Prior Living Arrangements/Services                       Activities of Daily Living      Permission Sought/Granted                  Emotional Assessment              Admission diagnosis:  Weakness, Failure to thrive Patient Active Problem List   Diagnosis Date Noted  . Shoulder pain 08/11/2017  . Urinary urgency 08/11/2017  . Venous insufficiency of both lower extremities 10/25/2016  . Bilateral leg edema 10/19/2016  . Prostate cancer ([place]) 09/27/2016  . Essential hypertension 10/29/2015  . Hyperlipidemia 10/29/2015  . Esophageal reflux 10/29/2015  . Centrilobular emphysema ([place]) 10/29/2015  . Labyrinthitis 10/29/2015  . Microhematuria 09/29/2015  . 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 Cerebral vascular disease 06/21/2015  . AAA (abdominal aortic aneurysm) ([place]) 05/21/2015  . Degenerative disc disease, lumbar 04/24/2015  . Urinary incontinence, male, stress 04/24/2015  . Dizziness 01/20/2015  . Endocarditis 01/20/2015  . Aneurysm of abdominal vessel ([place]) 08/30/2014  . Chronic obstructive pulmonary disease ([place])  08/30/2014  . Personal history of transient ischemic attack (TIA), and cerebral infarction without residual deficits 08/30/2014   PCP:  Juline Patch, MD Pharmacy:   [place], [place] - [place] [address] Phone: [phone] Fax: [phone]     Social Determinants of Health (SDOH) Interventions    Readmission Risk Interventions No flowsheet data found.

## 2020-03-14 NOTE — ED Notes (Signed)
Meal tray and drink provided

## 2020-03-14 NOTE — NC FL2 (Signed)
[place] LEVEL OF CARE SCREENING TOOL     IDENTIFICATION  Patient Name: Howard Anderson Birthdate: [birth year] Sex: male Admission Date (Current Location): 03/13/2020  [facility] and [place] Number:  Engineering geologist and Address:  [facility], [address], [place], [place] [address]      Provider Number: [id number]  Attending Physician Name and Address:  No att. providers found  Relative Name and Phone Number:       Current Level of Care: Hospital Recommended Level of Care: [place] Prior Approval Number:    Date Approved/Denied:   PASRR Number:    Discharge Plan: SNF    Current Diagnoses: Patient Active Problem List   Diagnosis Date Noted  . Shoulder pain 08/11/2017  . Urinary urgency 08/11/2017  . Venous insufficiency of both lower extremities 10/25/2016  . Bilateral leg edema 10/19/2016  . Prostate cancer ([place]) 09/27/2016  . Essential hypertension 10/29/2015  . Hyperlipidemia 10/29/2015  . Esophageal reflux 10/29/2015  . Centrilobular emphysema ([place]) 10/29/2015  . Labyrinthitis 10/29/2015  . Microhematuria 09/29/2015  . 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 Cerebral vascular disease 06/21/2015  . AAA (abdominal aortic aneurysm) ([place]) 05/21/2015  . Degenerative disc disease, lumbar 04/24/2015  . Urinary incontinence, male, stress 04/24/2015  . Dizziness 01/20/2015  . Endocarditis 01/20/2015  . Aneurysm of abdominal vessel ([place]) 08/30/2014  . Chronic obstructive pulmonary disease ([place]) 08/30/2014  . Personal history of transient ischemic attack (TIA), and cerebral infarction without residual deficits 08/30/2014    Orientation RESPIRATION BLADDER Height & Weight     Self, Time, Situation, Place  Normal Continent Weight: 97.5 kg Height:  5\' 7"  (170.2 cm)  BEHAVIORAL SYMPTOMS/MOOD NEUROLOGICAL BOWEL NUTRITION STATUS      Continent    AMBULATORY STATUS COMMUNICATION OF NEEDS Skin    Limited Assist   Normal                       Personal Care Assistance Level of Assistance  Bathing, Feeding, Dressing Bathing Assistance: Limited assistance Feeding assistance: Limited assistance Dressing Assistance: Limited assistance     Functional Limitations Info             [place]  PT (By licensed PT), OT (By licensed OT)     PT Frequency: min 5xweek OT Frequency: min 5xweek            Contractures      Additional Factors Info                  Current Medications (03/14/2020):  This is the current hospital active medication list Current Facility-Administered Medications  Medication Dose Route Frequency Provider Last Rate Last Admin  . ciprofloxacin (CIPRO) tablet 500 mg  500 mg Oral BID Merlyn Lot, MD   500 mg at 03/14/20 1214  . ciprofloxacin-dexamethasone (CIPRODEX) 0.3-0.1 % OTIC (EAR) suspension 4 drop  4 drop Left EAR Once Merlyn Lot, MD       Current Outpatient Medications  Medication Sig Dispense Refill  . aspirin 81 MG tablet Take 1 tablet (81 mg total) by mouth daily. 30 tablet 11  . atorvastatin (LIPITOR) 10 MG tablet TAKE 1 TABLET BY MOUTH DAILY (Patient taking differently: Take 10 mg by mouth every evening. ) 90 tablet 1  . brimonidine-timolol (COMBIGAN) 0.2-0.5 % ophthalmic solution Place 1 drop into both eyes every 12 (twelve) hours.    . carbidopa-levodopa (SINEMET IR) 25-100 MG  tablet Take 1.5 tablets by mouth 3 (three) times daily. Dr Manuella Ghazi    . cephALEXin (KEFLEX) 500 MG capsule Take 1 capsule (500 mg total) by mouth 2 (two) times daily for 5 days. 10 capsule 0  . Cholecalciferol (VITAMIN D3) 1000 UNITS CAPS Take 1,000 Units by mouth daily.     . ciprofloxacin (CIPRO) 500 MG tablet Take 1 tablet (500 mg total) by mouth 2 (two) times daily for 5 days. 5 tablet 0  . dorzolamide (TRUSOPT) 2 % ophthalmic solution Place 1 drop into both eyes 2 (two) times daily.   3  . doxycycline (PERIOSTAT) 20 MG  tablet Take 20 mg by mouth 2 (two) times daily. dentist    . famotidine (PEPCID) 40 MG tablet Take 1 tablet (40 mg total) by mouth daily. 90 tablet 1  . latanoprost (XALATAN) 0.005 % ophthalmic solution Place 1 drop into both eyes at bedtime.     . Multiple Vitamins-Minerals (PRESERVISION AREDS PO) Take 1 capsule by mouth 2 (two) times daily.    . sertraline (ZOLOFT) 50 MG tablet Take 1.5 tablets (75 mg total) by mouth daily. 135 tablet 1  . tamsulosin (FLOMAX) 0.4 MG CAPS capsule TAKE 1 CAPSULE BY MOUTH ONCE DAILY (Patient taking differently: Take 0.4 mg by mouth daily after supper. ) 90 capsule 3  . traZODone (DESYREL) 50 MG tablet Take 1 tablet (50 mg total) by mouth at bedtime. (Patient taking differently: Take 50 mg by mouth at bedtime as needed for sleep. ) 90 tablet 1  . valsartan (DIOVAN) 80 MG tablet TAKE 1 TABLET (80 MG TOTAL) BY MOUTH ONCE DAILY. (Patient taking differently: Take 80 mg by mouth daily. ) 90 tablet 3  . vitamin B-12 (CYANOCOBALAMIN) 1000 MCG tablet Take 1,000 mcg by mouth daily.       Discharge Medications: Please see discharge summary for a list of discharge medications.  Relevant Imaging Results:  Relevant Lab Results:   Additional Information XK#[id number]  Anselm Pancoast, RN

## 2020-03-14 NOTE — ED Notes (Signed)
Pt cleaned of urine and stool.  New brief and linens placed.

## 2020-03-14 NOTE — TOC Progression Note (Signed)
Transition of Care [facility]) - Progression Note    Patient Details  Name: Howard Anderson MRN: [id number] Date of Birth: 06-Mar-1946  Transition of Care [facility]) CM/SW [place], RN Phone Number: 03/14/2020, 3:40 PM  Clinical Narrative:    Phoebe Perch and PASRR completed. PASRR number [id number]   Expected Discharge Plan: [place] Barriers to Discharge: No Barriers Identified  Expected Discharge Plan and Services Expected Discharge Plan: [place]   Discharge Planning Services: CM Consult   Living arrangements for the past 2 months: Single Family Home                                       Social Determinants of Health (SDOH) Interventions    Readmission Risk Interventions No flowsheet data found.

## 2020-03-14 NOTE — ED Notes (Signed)
Pt cleaned of stool and urine, new brief placed.  No other needs at this time.

## 2020-03-14 NOTE — ED Notes (Signed)
PT at bedside for eval.

## 2020-03-14 NOTE — ED Provider Notes (Signed)
Was asked to patient to evaluate his left ear status post recent laceration.  Was placed on Keflex after discharge.  Uncertain as to whether patient was taking this patient without any bleeding but he does have swelling and findings of some otitis with mild cellulitis versus posttraumatic inflammation.  Will place on Cipro cover for Pseudomonas he is not septic appearing.  No mastoiditis.  Will provide topical drops as well.   Merlyn Lot, MD 03/14/20 1108

## 2020-03-14 NOTE — ED Notes (Signed)
Pt placed on bedpan to have BM. Pt cleaned of stool. New brief and chuck placed.

## 2020-03-14 NOTE — Evaluation (Signed)
Physical Therapy Evaluation Patient Details Name: Howard Anderson MRN: [id number] DOB: [birth year] Today's Date: 03/14/2020   History of Present Illness  74 y.o. male who lives by himself and still works.  He says he has Parkinson's disease and is getting more more unsteady and falling a lot.  Additionally there are times when he cannot move at all he is so weak.  EMS reports that home is unkept and apparently he had no moved out of recliner for the 16 hours since EMS was last there.  Clinical Impression  Pt completely unable to get himself scooted in bed, roll to side and struggled excessively with trying to transition to sitting, ultimately needed max assist to elevate trunk and scoot hips toward EOB.  Once assisted to sitting he moved reasonably well, though during ambulation he displayed shuffling, halting cadence (typical Parkinsonian presentation) - pt admits that he has not been safe at home and has struggled to manage but is still hopeful that he will be able to return home - however he is aware that he may need to consider transition to ALF after rehab for safety.      Follow Up Recommendations SNF ([facility] and [place] system if he refuses STR)    Equipment Recommendations  Rolling walker with 5" wheels    Recommendations for Other Services       Precautions / Restrictions Precautions Precautions: Fall Restrictions Weight Bearing Restrictions: No      Mobility  Bed Mobility Overal bed mobility: Needs Assistance Bed Mobility: Supine to Sit;Sit to Supine     Supine to sit: Max assist Sit to supine: Max assist   General bed mobility comments: Pt made effort with getting to EOB, even with PT's hand acting as rail he could not get torso off bed at all.  Ultimately needed very heavy assist just to get to sitting  Transfers Overall transfer level: Needs assistance Equipment used: Rolling walker (2 wheeled) Transfers: Sit to/from Stand Sit to Stand: Min assist          General transfer comment: pt weak with effort to get to standing, but needed light physical assist to rise from high ED bed  Ambulation/Gait Ambulation/Gait assistance: Min guard Gait Distance (Feet): 85 Feet Assistive device: Rolling walker (2 wheeled)       General Gait Details: One up in the walker pt actually did quite well with minimal reliance on the walker, even able to do the last 15 ft w/o AD.  Pt with choppy, Parkinsonian gait that is more pronounced without AD.    Stairs            Wheelchair Mobility    Modified Rankin (Stroke Patients Only)       Balance Overall balance assessment: Needs assistance   Sitting balance-Leahy Scale: Fair Sitting balance - Comments: good once assisted to sitting and square with EOB, needed plenty of assist with getting into position     Standing balance-Leahy Scale: Fair Standing balance comment: Pt unable to heel raise of perform SLS more than ~1 second even with heavy b/l HHA                             Pertinent Vitals/Pain Pain Assessment:  (mild pain associated with fall (hips, L side of face))    Home Living Family/patient expects to be discharged to:: Skilled nursing facility  Additional Comments: Pt lives alone, is out of the home almost daily for work but has had more and more falls recently.    Prior Function Level of Independence: Independent (apparently he does not typically use an AD)               Hand Dominance        Extremity/Trunk Assessment   Upper Extremity Assessment Upper Extremity Assessment: Generalized weakness    Lower Extremity Assessment Lower Extremity Assessment: Generalized weakness       Communication   Communication: No difficulties  Cognition   Behavior During Therapy: WFL for tasks assessed/performed Overall Cognitive Status: Within Functional Limits for tasks assessed                                 General Comments:  Pt not aware of date, but knew month, location, situation      General Comments      Exercises     Assessment/Plan    PT Assessment Patient needs continued PT services  PT Problem List Decreased strength;Decreased range of motion;Decreased activity tolerance;Decreased balance;Decreased mobility;Decreased coordination;Decreased safety awareness;Decreased knowledge of use of DME       PT Treatment Interventions DME instruction;Gait training;Stair training;Functional mobility training;Therapeutic activities;Therapeutic exercise;Balance training;Cognitive remediation    PT Goals (Current goals can be found in the Care Plan section)  Acute Rehab PT Goals Patient Stated Goal: get stronger, thinking about transition to ALF PT Goal Formulation: With patient Time For Goal Achievement: 03/28/20 Potential to Achieve Goals: Fair    Frequency Min 2X/week   Barriers to discharge        Co-evaluation               AM-PAC PT "6 Clicks" Mobility  Outcome Measure Help needed turning from your back to your side while in a flat bed without using bedrails?: Total Help needed moving from lying on your back to sitting on the side of a flat bed without using bedrails?: Total Help needed moving to and from a bed to a chair (including a wheelchair)?: A Lot Help needed standing up from a chair using your arms (e.g., wheelchair or bedside chair)?: A Lot Help needed to walk in hospital room?: A Little Help needed climbing 3-5 steps with a railing? : A Lot 6 Click Score: 11    End of Session Equipment Utilized During Treatment: Gait belt Activity Tolerance: Patient tolerated treatment well;Patient limited by fatigue Patient left: in bed;with call bell/phone within reach Nurse Communication: Mobility status PT Visit Diagnosis: Muscle weakness (generalized) (M62.81);Difficulty in walking, not elsewhere classified (R26.2)    Time: 3903-0092 PT Time Calculation (min) (ACUTE ONLY): 35  min   Charges:   PT Evaluation $PT Eval Low Complexity: 1 Low PT Treatments $Therapeutic Activity: 8-22 mins        Kreg Shropshire, DPT 03/14/2020, 12:44 PM

## 2020-03-14 NOTE — ED Notes (Addendum)
Pt's close friend Isabella Bowens at bedside. Her phone number is [phone]. Pt verbalized to this RN that he needed rehabilitative services.

## 2020-03-17 NOTE — Telephone Encounter (Signed)
Called and spoke with sherry. Explained to her that she is not on the patients DPR so legally I cannot tell her anything about the patient or his records. She said she understands completely. Wanted [place] to be aware the patients health is declining. She said the patient was placed in Rehab in [place] at [facility] on [address]. He has been there since Friday night after leaving the hospital. They said at the rehab center they are sure he has now had a stroke. He is having difficulty being woken up out of bed. Explained we cannot tell her anything other than to get whoever is on the DPR involved to keep her known of information from the facility.   CM

## 2020-03-17 NOTE — Telephone Encounter (Signed)
Sherry requesting to speak with Baxter Flattery regarding patient and his "declining health". Judeen Hammans is not on DPR.    Cb# [phone]

## 2020-03-31 ENCOUNTER — Emergency Department: Payer: Medicare Other

## 2020-03-31 ENCOUNTER — Encounter: Payer: Self-pay | Admitting: Emergency Medicine

## 2020-03-31 ENCOUNTER — Other Ambulatory Visit: Payer: Self-pay

## 2020-03-31 ENCOUNTER — Inpatient Hospital Stay
Admission: EM | Admit: 2020-03-31 | Discharge: 2020-05-02 | DRG: 871 | Disposition: E | Payer: Medicare Other | Source: Skilled Nursing Facility | Attending: Pulmonary Disease | Admitting: Pulmonary Disease

## 2020-03-31 DIAGNOSIS — I248 Other forms of acute ischemic heart disease: Secondary | ICD-10-CM | POA: Diagnosis present

## 2020-03-31 DIAGNOSIS — R6521 Severe sepsis with septic shock: Secondary | ICD-10-CM | POA: Diagnosis present

## 2020-03-31 DIAGNOSIS — J9601 Acute respiratory failure with hypoxia: Secondary | ICD-10-CM | POA: Diagnosis present

## 2020-03-31 DIAGNOSIS — G9341 Metabolic encephalopathy: Secondary | ICD-10-CM | POA: Diagnosis present

## 2020-03-31 DIAGNOSIS — L89150 Pressure ulcer of sacral region, unstageable: Secondary | ICD-10-CM | POA: Diagnosis present

## 2020-03-31 DIAGNOSIS — A419 Sepsis, unspecified organism: Principal | ICD-10-CM | POA: Diagnosis present

## 2020-03-31 DIAGNOSIS — I10 Essential (primary) hypertension: Secondary | ICD-10-CM | POA: Diagnosis present

## 2020-03-31 DIAGNOSIS — R7989 Other specified abnormal findings of blood chemistry: Secondary | ICD-10-CM | POA: Diagnosis not present

## 2020-03-31 DIAGNOSIS — J189 Pneumonia, unspecified organism: Secondary | ICD-10-CM | POA: Diagnosis present

## 2020-03-31 DIAGNOSIS — Z8546 Personal history of malignant neoplasm of prostate: Secondary | ICD-10-CM

## 2020-03-31 DIAGNOSIS — G039 Meningitis, unspecified: Secondary | ICD-10-CM | POA: Diagnosis present

## 2020-03-31 DIAGNOSIS — Z7982 Long term (current) use of aspirin: Secondary | ICD-10-CM | POA: Diagnosis not present

## 2020-03-31 DIAGNOSIS — Z20822 Contact with and (suspected) exposure to covid-19: Secondary | ICD-10-CM | POA: Diagnosis present

## 2020-03-31 DIAGNOSIS — I714 Abdominal aortic aneurysm, without rupture: Secondary | ICD-10-CM | POA: Diagnosis present

## 2020-03-31 DIAGNOSIS — Z86718 Personal history of other venous thrombosis and embolism: Secondary | ICD-10-CM

## 2020-03-31 DIAGNOSIS — I69328 Other speech and language deficits following cerebral infarction: Secondary | ICD-10-CM | POA: Diagnosis not present

## 2020-03-31 DIAGNOSIS — R739 Hyperglycemia, unspecified: Secondary | ICD-10-CM | POA: Diagnosis present

## 2020-03-31 DIAGNOSIS — E870 Hyperosmolality and hypernatremia: Secondary | ICD-10-CM | POA: Diagnosis present

## 2020-03-31 DIAGNOSIS — N179 Acute kidney failure, unspecified: Secondary | ICD-10-CM | POA: Diagnosis present

## 2020-03-31 DIAGNOSIS — Z8042 Family history of malignant neoplasm of prostate: Secondary | ICD-10-CM

## 2020-03-31 DIAGNOSIS — I82411 Acute embolism and thrombosis of right femoral vein: Secondary | ICD-10-CM | POA: Diagnosis present

## 2020-03-31 DIAGNOSIS — R4182 Altered mental status, unspecified: Secondary | ICD-10-CM | POA: Diagnosis present

## 2020-03-31 DIAGNOSIS — K219 Gastro-esophageal reflux disease without esophagitis: Secondary | ICD-10-CM | POA: Diagnosis present

## 2020-03-31 DIAGNOSIS — E785 Hyperlipidemia, unspecified: Secondary | ICD-10-CM | POA: Diagnosis present

## 2020-03-31 DIAGNOSIS — E875 Hyperkalemia: Secondary | ICD-10-CM | POA: Diagnosis present

## 2020-03-31 DIAGNOSIS — Z79899 Other long term (current) drug therapy: Secondary | ICD-10-CM

## 2020-03-31 DIAGNOSIS — G20 Parkinson's disease: Secondary | ICD-10-CM | POA: Diagnosis present

## 2020-03-31 DIAGNOSIS — J432 Centrilobular emphysema: Secondary | ICD-10-CM | POA: Diagnosis present

## 2020-03-31 DIAGNOSIS — R9389 Abnormal findings on diagnostic imaging of other specified body structures: Secondary | ICD-10-CM

## 2020-03-31 DIAGNOSIS — Z87891 Personal history of nicotine dependence: Secondary | ICD-10-CM

## 2020-03-31 LAB — COMPREHENSIVE METABOLIC PANEL
ALT: 40 U/L (ref 0–44)
ALT: 40 U/L (ref 0–44)
AST: 72 U/L — ABNORMAL HIGH (ref 15–41)
AST: 92 U/L — ABNORMAL HIGH (ref 15–41)
Albumin: 2.5 g/dL — ABNORMAL LOW (ref 3.5–5.0)
Albumin: 2.2 g/dL — ABNORMAL LOW (ref 3.5–5.0)
Alkaline Phosphatase: 42 U/L (ref 38–126)
Alkaline Phosphatase: 37 U/L — ABNORMAL LOW (ref 38–126)
Anion gap: 9 (ref 5–15)
Anion gap: 12 (ref 5–15)
BUN: 95 mg/dL — ABNORMAL HIGH (ref 8–23)
BUN: 88 mg/dL — ABNORMAL HIGH (ref 8–23)
CO2: 29 mmol/L (ref 22–32)
CO2: 24 mmol/L (ref 22–32)
Calcium: 7.9 mg/dL — ABNORMAL LOW (ref 8.9–10.3)
Calcium: 6.9 mg/dL — ABNORMAL LOW (ref 8.9–10.3)
Chloride: 115 mmol/L — ABNORMAL HIGH (ref 98–111)
Chloride: 118 mmol/L — ABNORMAL HIGH (ref 98–111)
Creatinine, Ser: 3.25 mg/dL — ABNORMAL HIGH (ref 0.61–1.24)
Creatinine, Ser: 2.75 mg/dL — ABNORMAL HIGH (ref 0.61–1.24)
GFR calc Af Amer: 21 mL/min — ABNORMAL LOW (ref >60)
GFR calc Af Amer: 25 mL/min — ABNORMAL LOW (ref >60)
GFR calc non Af Amer: 18 mL/min — ABNORMAL LOW (ref >60)
GFR calc non Af Amer: 22 mL/min — ABNORMAL LOW (ref >60)
Glucose, Bld: 181 mg/dL — ABNORMAL HIGH (ref 70–99)
Glucose, Bld: 206 mg/dL — ABNORMAL HIGH (ref 70–99)
Potassium: 5.5 mmol/L — ABNORMAL HIGH (ref 3.5–5.1)
Potassium: 5 mmol/L (ref 3.5–5.1)
Sodium: 153 mmol/L — ABNORMAL HIGH (ref 135–145)
Sodium: 154 mmol/L — ABNORMAL HIGH (ref 135–145)
Total Bilirubin: 0.9 mg/dL (ref 0.3–1.2)
Total Bilirubin: 1.1 mg/dL (ref 0.3–1.2)
Total Protein: 6.7 g/dL (ref 6.5–8.1)
Total Protein: 5.8 g/dL — ABNORMAL LOW (ref 6.5–8.1)

## 2020-03-31 LAB — URINALYSIS, COMPLETE (UACMP) WITH MICROSCOPIC
Bacteria, UA: NONE SEEN
Bilirubin Urine: NEGATIVE
Glucose, UA: NEGATIVE mg/dL
Ketones, ur: NEGATIVE mg/dL
Leukocytes,Ua: NEGATIVE
Nitrite: NEGATIVE
Protein, ur: NEGATIVE mg/dL
Specific Gravity, Urine: 1.023 (ref 1.005–1.030)
Squamous Epithelial / HPF: NONE SEEN (ref 0–5)
pH: 5 (ref 5.0–8.0)

## 2020-03-31 LAB — BLOOD GAS, VENOUS
Acid-base deficit: 0.9 mmol/L (ref 0.0–2.0)
Bicarbonate: 26.8 mmol/L (ref 20.0–28.0)
O2 Saturation: 87.5 %
Patient temperature: 37
pCO2, Ven: 57 mmHg (ref 44.0–60.0)
pH, Ven: 7.28 (ref 7.250–7.430)
pO2, Ven: 61 mmHg — ABNORMAL HIGH (ref 32.0–45.0)

## 2020-03-31 LAB — TROPONIN I (HIGH SENSITIVITY)
Troponin I (High Sensitivity): 137 ng/L (ref <18)
Troponin I (High Sensitivity): 136 ng/L (ref <18)

## 2020-03-31 LAB — LIPASE, BLOOD: Lipase: 59 U/L — ABNORMAL HIGH (ref 11–51)

## 2020-03-31 LAB — CBC WITH DIFFERENTIAL/PLATELET
Abs Immature Granulocytes: 0.18 10*3/uL — ABNORMAL HIGH (ref 0.00–0.07)
Abs Immature Granulocytes: 0.27 10*3/uL — ABNORMAL HIGH (ref 0.00–0.07)
Basophils Absolute: 0 10*3/uL (ref 0.0–0.1)
Basophils Absolute: 0 10*3/uL (ref 0.0–0.1)
Basophils Relative: 0 %
Basophils Relative: 0 %
Eosinophils Absolute: 0 10*3/uL (ref 0.0–0.5)
Eosinophils Absolute: 0 10*3/uL (ref 0.0–0.5)
Eosinophils Relative: 0 %
Eosinophils Relative: 0 %
HCT: 50.1 % (ref 39.0–52.0)
HCT: 45.9 % (ref 39.0–52.0)
Hemoglobin: 15.8 g/dL (ref 13.0–17.0)
Hemoglobin: 14.2 g/dL (ref 13.0–17.0)
Immature Granulocytes: 1 %
Immature Granulocytes: 1 %
Lymphocytes Relative: 5 %
Lymphocytes Relative: 8 %
Lymphs Abs: 0.9 10*3/uL (ref 0.7–4.0)
Lymphs Abs: 1.5 10*3/uL (ref 0.7–4.0)
MCH: 29.6 pg (ref 26.0–34.0)
MCH: 30.2 pg (ref 26.0–34.0)
MCHC: 31.5 g/dL (ref 30.0–36.0)
MCHC: 30.9 g/dL (ref 30.0–36.0)
MCV: 94 fL (ref 80.0–100.0)
MCV: 97.7 fL (ref 80.0–100.0)
Monocytes Absolute: 2.1 10*3/uL — ABNORMAL HIGH (ref 0.1–1.0)
Monocytes Absolute: 1.1 10*3/uL — ABNORMAL HIGH (ref 0.1–1.0)
Monocytes Relative: 11 %
Monocytes Relative: 6 %
Neutro Abs: 16.2 10*3/uL — ABNORMAL HIGH (ref 1.7–7.7)
Neutro Abs: 15.7 10*3/uL — ABNORMAL HIGH (ref 1.7–7.7)
Neutrophils Relative %: 83 %
Neutrophils Relative %: 85 %
Platelets: 213 10*3/uL (ref 150–400)
Platelets: 189 10*3/uL (ref 150–400)
RBC: 5.33 MIL/uL (ref 4.22–5.81)
RBC: 4.7 MIL/uL (ref 4.22–5.81)
RDW: 14.8 % (ref 11.5–15.5)
RDW: 15 % (ref 11.5–15.5)
WBC: 19.4 10*3/uL — ABNORMAL HIGH (ref 4.0–10.5)
WBC: 18.6 10*3/uL — ABNORMAL HIGH (ref 4.0–10.5)
nRBC: 0.1 % (ref 0.0–0.2)
nRBC: 0 % (ref 0.0–0.2)

## 2020-03-31 LAB — PROTIME-INR
INR: 1.5 — ABNORMAL HIGH (ref 0.8–1.2)
Prothrombin Time: 17.7 seconds — ABNORMAL HIGH (ref 11.4–15.2)

## 2020-03-31 LAB — SAMPLE TO BLOOD BANK

## 2020-03-31 LAB — APTT: aPTT: 33 seconds (ref 24–36)

## 2020-03-31 LAB — SARS CORONAVIRUS 2 BY RT PCR (HOSPITAL ORDER, PERFORMED IN ~~LOC~~ HOSPITAL LAB): SARS Coronavirus 2: NEGATIVE

## 2020-03-31 LAB — PROCALCITONIN
Procalcitonin: 6.01 ng/mL
Procalcitonin: 10.93 ng/mL

## 2020-03-31 LAB — LACTIC ACID, PLASMA
Lactic Acid, Venous: 2.5 mmol/L (ref 0.5–1.9)
Lactic Acid, Venous: 1.8 mmol/L (ref 0.5–1.9)
Lactic Acid, Venous: 2.7 mmol/L (ref 0.5–1.9)

## 2020-03-31 LAB — FIBRIN DERIVATIVES D-DIMER (ARMC ONLY): Fibrin derivatives D-dimer (ARMC): >7500 ng/mL (FEU) — ABNORMAL HIGH (ref 0.00–499.00)

## 2020-03-31 MED ORDER — SODIUM CHLORIDE 0.9 % IV SOLN
2.0000 g | Freq: Three times a day (TID) | INTRAVENOUS | Status: DC
  Administered 2020-03-31 – 2020-04-01 (×4): 2 g via INTRAVENOUS
  Filled 2020-03-31: qty 2
  Filled 2020-03-31 (×6): qty 2000

## 2020-03-31 MED ORDER — VASOPRESSIN 20 UNITS/100 ML INFUSION FOR SHOCK
0.0000 [IU]/min | INTRAVENOUS | Status: DC
  Administered 2020-04-01: 0.04 [IU]/min via INTRAVENOUS
  Administered 2020-04-01 (×2): 0.03 [IU]/min via INTRAVENOUS
  Filled 2020-03-31 (×3): qty 100

## 2020-03-31 MED ORDER — DEXAMETHASONE SODIUM PHOSPHATE 10 MG/ML IJ SOLN
12.0000 mg | Freq: Four times a day (QID) | INTRAMUSCULAR | Status: DC
  Administered 2020-04-01 (×4): 12 mg via INTRAVENOUS
  Filled 2020-03-31 (×4): qty 1.2
  Filled 2020-03-31: qty 2
  Filled 2020-03-31: qty 1.2
  Filled 2020-03-31: qty 2

## 2020-03-31 MED ORDER — HEPARIN BOLUS VIA INFUSION
4000.0000 [IU] | Freq: Once | INTRAVENOUS | Status: AC
  Administered 2020-04-01: 4000 [IU] via INTRAVENOUS
  Filled 2020-03-31: qty 4000

## 2020-03-31 MED ORDER — SODIUM CHLORIDE 0.9 % IV BOLUS
1000.0000 mL | Freq: Once | INTRAVENOUS | Status: AC
  Administered 2020-03-31: 1000 mL via INTRAVENOUS

## 2020-03-31 MED ORDER — VANCOMYCIN HCL IN DEXTROSE 1-5 GM/200ML-% IV SOLN
1000.0000 mg | Freq: Once | INTRAVENOUS | Status: AC
  Administered 2020-04-01: 1000 mg via INTRAVENOUS
  Filled 2020-03-31: qty 200

## 2020-03-31 MED ORDER — POLYETHYLENE GLYCOL 3350 17 G PO PACK: 17.0000 g | PACK | Freq: Every day | ORAL | Status: DC | PRN

## 2020-03-31 MED ORDER — DOCUSATE SODIUM 100 MG PO CAPS: 100.0000 mg | ORAL_CAPSULE | Freq: Two times a day (BID) | ORAL | Status: DC | PRN

## 2020-03-31 MED ORDER — SODIUM CHLORIDE 0.9 % IV SOLN
2.0000 g | Freq: Two times a day (BID) | INTRAVENOUS | Status: DC
  Administered 2020-04-01 (×2): 2 g via INTRAVENOUS
  Filled 2020-03-31: qty 2
  Filled 2020-03-31 (×2): qty 20

## 2020-03-31 MED ORDER — DEXAMETHASONE SODIUM PHOSPHATE 10 MG/ML IJ SOLN
10.0000 mg | Freq: Once | INTRAMUSCULAR | Status: AC
  Administered 2020-03-31: 10 mg via INTRAVENOUS
  Filled 2020-03-31: qty 1

## 2020-03-31 MED ORDER — VANCOMYCIN HCL IN DEXTROSE 1-5 GM/200ML-% IV SOLN
1000.0000 mg | Freq: Once | INTRAVENOUS | Status: AC
  Administered 2020-03-31: 1000 mg via INTRAVENOUS
  Filled 2020-03-31: qty 200

## 2020-03-31 MED ORDER — VANCOMYCIN VARIABLE DOSE PER UNSTABLE RENAL FUNCTION (PHARMACIST DOSING): Status: DC

## 2020-03-31 MED ORDER — DEXTROSE 5 % IV SOLN
750.0000 mg | INTRAVENOUS | Status: DC
  Administered 2020-03-31: 750 mg via INTRAVENOUS
  Filled 2020-03-31 (×2): qty 15

## 2020-03-31 MED ORDER — LACTATED RINGERS IV BOLUS: 1000.0000 mL | Freq: Once | INTRAVENOUS | Status: DC

## 2020-03-31 MED ORDER — SODIUM CHLORIDE 0.9 % IV SOLN
1.0000 g | Freq: Once | INTRAVENOUS | Status: AC
  Administered 2020-03-31: 1 g via INTRAVENOUS
  Filled 2020-03-31: qty 1

## 2020-03-31 MED ORDER — SODIUM CHLORIDE 0.45 % IV SOLN: INTRAVENOUS | Status: DC

## 2020-03-31 MED ORDER — SODIUM CHLORIDE 0.9 % IV SOLN
2.0000 g | Freq: Once | INTRAVENOUS | Status: AC
  Administered 2020-03-31: 2 g via INTRAVENOUS
  Filled 2020-03-31: qty 20

## 2020-03-31 MED ORDER — ACETAMINOPHEN 650 MG RE SUPP
650.0000 mg | Freq: Once | RECTAL | Status: AC
  Administered 2020-03-31: 650 mg via RECTAL
  Filled 2020-03-31: qty 1

## 2020-03-31 MED ORDER — HEPARIN (PORCINE) 25000 UT/250ML-% IV SOLN
1200.0000 [IU]/h | INTRAVENOUS | Status: DC
  Administered 2020-04-01: 1400 [IU]/h via INTRAVENOUS
  Administered 2020-04-01: 1200 [IU]/h via INTRAVENOUS
  Filled 2020-03-31 (×2): qty 250

## 2020-03-31 MED ORDER — NOREPINEPHRINE 4 MG/250ML-% IV SOLN
0.0000 ug/min | INTRAVENOUS | Status: DC
  Administered 2020-03-31: 20 ug/min via INTRAVENOUS
  Administered 2020-03-31 – 2020-04-01 (×2): 30 ug/min via INTRAVENOUS
  Filled 2020-03-31 (×3): qty 250

## 2020-03-31 MED ORDER — LACTATED RINGERS IV BOLUS
1000.0000 mL | Freq: Once | INTRAVENOUS | Status: AC
  Administered 2020-03-31: 1000 mL via INTRAVENOUS

## 2020-03-31 MED ORDER — HEPARIN SODIUM (PORCINE) 5000 UNIT/ML IJ SOLN: 5000.0000 [IU] | Freq: Three times a day (TID) | INTRAMUSCULAR | Status: DC

## 2020-03-31 NOTE — ED Notes (Addendum)
ICU provider at bedside.  Line with acyclovir infusing infiltrated. ICU provider Roderic Palau, NP notified. No new orders. Area of infiltration noted to be red in color, cold to the touch

## 2020-03-31 NOTE — ED Notes (Signed)
Lab called for blood draw. Lab stated they will send someone.

## 2020-03-31 NOTE — ED Notes (Signed)
ICU provider at bedside to place central line.

## 2020-03-31 NOTE — Progress Notes (Signed)
CODE SEPSIS - PHARMACY COMMUNICATION  **Broad Spectrum Antibiotics should be administered within 1 hour of Sepsis diagnosis**  Time Code Sepsis Called/Page Received: 1515  Antibiotics Ordered: cefepime / ceftriaxone  Time of 1st antibiotic administration: 1600  Additional action taken by pharmacy: n/a  Benita Gutter  03/26/2020  3:27 PM

## 2020-03-31 NOTE — ED Notes (Signed)
Pt going to MRI with Gershon Mussel, RN on levophed gtt and monitor.

## 2020-03-31 NOTE — ED Notes (Signed)
Provider from ICU stated he will come down to start a central line.

## 2020-03-31 NOTE — H&P (Addendum)
Name: Howard Anderson MRN: [id number] DOB: [birth year]    ADMISSION DATE:  03/19/2020 CONSULTATION DATE:  03/19/2020  REFERRING MD :  Dr. Cinda Quest  CHIEF COMPLAINT:  Altered Mental Status  BRIEF PATIENT DESCRIPTION:  74 y.o. Male admitted with Septic Shock secondary to suspected Meningitis and questionable Pneumonia, along with Acute Hypoxic Respiratory Failure, AKI.  Concern for possible PE given pt has occlusive DVT to Right Lower Extremity.  SIGNIFICANT EVENTS  8/30: Presents to ED; PCCM asked to admit to ICU due to vasopressors 8/30: Right IJ CVC placed   STUDIES:  8/30: CXR>>Mild bibasilar atelectasis has developed. Small left pleural effusion has developed. No pneumothorax. Cardiac size is within normal limits, unchanged from prior examination. Multiple healed right rib fractures are noted. No acute bone abnormality. 8/30: CT Head w/o contrast>>1. No CT evidence for acute intracranial abnormality. Atrophy and chronic small vessel ischemic changes of the white matter. 8/30: CT Cervical spine>>Straightening of the cervical spine with degenerative changes. No acute osseous abnormality 8/30: MR Brain>> 8/31: Bilateral Venous LE Ultrasound>>1. Occlusive thrombus within the right common femoral, femoral, and popliteal veins. Occlusive thrombus within the right posterior tibial and peroneal veins. 2. No deep venous thrombosis on the left. 3. Right Baker's cyst measuring up to 3.7 cm.   CULTURES: SARS-CoV-2 PCR 8/>> negative Blood culture 8/30>> Urine culture 8/30>> Strep pneumo urinary antigen 8/30>> Legionella urinary antigen 8/30>>   ANTIBIOTICS/ANTIVIRALS: Cefepime 8/30 x1 dose Ceftriaxone 8/30>> Ampicillin 8/30>> Vancomycin 8/30>> Acyclovir 8/30>>  HISTORY OF PRESENT ILLNESS:   Mr. Howard Anderson is a 74 year old male with a past medical history significant for stroke, AAA, COPD, depression, DVT, GERD, heart disease, hyperlipidemia, hypertension, Parkinson's disease,  prostate cancer who presents to [facility] ED on 03/23/2020 as he was found unresponsive today at [place].  His last known well time is unknown.  Patient is currently altered and no family is present at bedside, therefore history is obtained from ED and nursing notes.  Per notes, upon EMS arrival he was found to be unresponsive, hypertensive, febrile, hypoxic with O2 sats 84% on room air, and tachypneic.  Upon presentation to the ED his temperature was 105 rectally, respiratory rate 37, pulse 125, blood pressure 91/64, and 98% on nonrebreather.  Initial work-up in the ED revealed sodium 153, potassium 5.5, BUN 95, creatinine 3.25, glucose 181, high-sensitivity troponin 137, lactic acid 2.5, WBC 19.4, procalcitonin 6, and fibrin derivatives greater than 7500.  Chest x-ray showed mild bibasilar atelectasis and small left pleural effusion.  CT head without contrast is negative for any acute intracranial abnormality, CT cervical spine is negative.  His COVID-19 PCR is negative.  Physical exam revealed nuchal rigidity, concerning for possible meningitis.  Lumbar puncture was attempted by ED provider.  He met sepsis criteria therefore he received IV fluid resuscitation, and broad spectrum antibiotics, and IV Decadron.  Despite volume resuscitation he remained hypotensive, requiring initiation of vasopressors and central line placement.  Given his elevated fibrin derivatives, he was empirically placed on heparin drip due to concern for possible pulmonary embolism in the setting of his acute hypoxic respiratory failure.  Currently unable to obtain CTA chest due to acute renal failure, and he is too unstable for lung VQ scan at the present time.  Order placed for bilateral lower extremity ultrasound.  Bilateral lower extremity ultrasound shows occlusive thrombus within the right common femoral, femoral and popliteal veins.  PCCM is asked to admit the patient to ICU for further work-up and treatment of Septic  Shock secondary to suspected Meningitis and questionable Pneumonia, along with Acute Hypoxic Respiratory Failure, AKI.  Concern for possible PE given pt has occlusive DVT to Right Lower Extremity.   PAST MEDICAL HISTORY :   has a past medical history of AAA (abdominal aortic aneurysm) without rupture ([place]), AAA (abdominal aortic aneurysm) without rupture ([place]), Cancer ([place]), Chest pain, COPD (chronic obstructive pulmonary disease) ([place]), Depression, Dizziness and giddiness, Dvt femoral (deep venous thrombosis) ([place]), GERD (gastroesophageal reflux disease), Heart disease, Hyperlipidemia, Hypertension, Parkinson disease ([place]), Prostate cancer ([place]), Shortness of breath dyspnea, Shoulder fracture, right, and Stroke ([place]).  has a past surgical history that includes Colonoscopy (2010); Inner ear surgery; Cardiac catheterization (N/A, 05/21/2015); Cardiac catheterization (06/20/2015); Cardiac catheterization (06/20/2015); Embolectomy (Left, 06/20/2015); Endarterectomy femoral (Left, 06/20/2015); Fracture surgery (Right); Mandible surgery; Cataract extraction w/PHACO (Left, 09/27/2018); and Cataract extraction w/PHACO (Right, 02/28/2019). Prior to Admission medications   Medication Sig Start Date End Date Taking? Authorizing Provider  aspirin 81 MG tablet Take 1 tablet (81 mg total) by mouth daily. 03/17/18   Juline Patch, MD  atorvastatin (LIPITOR) 10 MG tablet TAKE 1 TABLET BY MOUTH DAILY Patient taking differently: Take 10 mg by mouth every evening.  02/11/20   Juline Patch, MD  brimonidine-timolol (COMBIGAN) 0.2-0.5 % ophthalmic solution Place 1 drop into both eyes every 12 (twelve) hours.    [provider]  carbidopa-levodopa (SINEMET IR) 25-100 MG tablet Take 1.5 tablets by mouth 3 (three) times daily. Dr Manuella Ghazi    [provider]  Cholecalciferol (VITAMIN D3) 1000 UNITS CAPS Take 1,000 Units by mouth daily.     [provider]  dorzolamide (TRUSOPT) 2 % ophthalmic solution  Place 1 drop into both eyes 2 (two) times daily.  07/30/16   [provider]  doxycycline (PERIOSTAT) 20 MG tablet Take 20 mg by mouth 2 (two) times daily. dentist    [provider]  famotidine (PEPCID) 40 MG tablet Take 1 tablet (40 mg total) by mouth daily. 02/11/20   Juline Patch, MD  latanoprost (XALATAN) 0.005 % ophthalmic solution Place 1 drop into both eyes at bedtime.     [provider]  Multiple Vitamins-Minerals (PRESERVISION AREDS PO) Take 1 capsule by mouth 2 (two) times daily.    [provider]  sertraline (ZOLOFT) 50 MG tablet Take 1.5 tablets (75 mg total) by mouth daily. 02/11/20   Juline Patch, MD  tamsulosin (FLOMAX) 0.4 MG CAPS capsule TAKE 1 CAPSULE BY MOUTH ONCE DAILY Patient taking differently: Take 0.4 mg by mouth daily after supper.  12/13/19   Stoioff, Ronda Fairly, MD  traZODone (DESYREL) 50 MG tablet Take 1 tablet (50 mg total) by mouth at bedtime. Patient taking differently: Take 50 mg by mouth at bedtime as needed for sleep.  02/11/20   Juline Patch, MD  valsartan (DIOVAN) 80 MG tablet TAKE 1 TABLET (80 MG TOTAL) BY MOUTH ONCE DAILY. Patient taking differently: Take 80 mg by mouth daily.  03/17/18   Juline Patch, MD  vitamin B-12 (CYANOCOBALAMIN) 1000 MCG tablet Take 1,000 mcg by mouth daily.    [provider]   No Known Allergies  FAMILY HISTORY:  family history includes Prostate cancer in his brother. SOCIAL HISTORY:  reports that he quit smoking about 6 years ago. He has a 72.00 pack-year smoking history. He has never used smokeless tobacco. He reports that he does not drink alcohol and does not use drugs.   COVID-19 DISASTER DECLARATION:  FULL CONTACT  PHYSICAL EXAMINATION WAS NOT POSSIBLE DUE TO TREATMENT OF COVID-19 AND  CONSERVATION OF PERSONAL PROTECTIVE EQUIPMENT, LIMITED EXAM FINDINGS INCLUDE-  Patient assessed or the symptoms described in the history of present illness.  In the context of the  Global COVID-19 pandemic, which necessitated consideration that the patient might be at risk for infection with the SARS-CoV-2 virus that causes COVID-19, Institutional protocols and algorithms that pertain to the evaluation of patients at risk for COVID-19 are in a state of rapid change based on information released by regulatory bodies including the CDC and federal and state organizations. These policies and algorithms were followed during the patient's care while in hospital.  REVIEW OF SYSTEMS:   Unable to assess due to AMS and critical illness  SUBJECTIVE:  Unable to assess due to AMS and critical illness  VITAL SIGNS: Temp:  [98 F (36.7 C)-105.1 F (40.6 C)] 98 F (36.7 C) (08/30 2125) Pulse Rate:  [60-125] 97 (08/30 2125) Resp:  [22-37] 24 (08/30 2125) BP: (76-117)/(35-78) 117/74 (08/30 2125) SpO2:  [89 %-100 %] 100 % (08/30 2125) Weight:  [97.5 kg] 97.5 kg (08/30 1525)  PHYSICAL EXAMINATION: General: Acute on chronically ill-appearing male, laying in bed, minimally responsive, no acute distress Neuro: Withdraws from pain, pupils PERRLA HEENT: Nuchal rigidity, Atraumatic, normocephalic Cardiovascular: Tachycardia, regular rhythm, S1-S2, no murmurs, rubs, gallops Lungs: Difficult to auscultate due to patient moaning, tachypnea, mild respiratory distress Abdomen: Obese, soft, nontender, nondistended, no guarding or rebound tenderness Musculoskeletal: No deformities, no edema, no clubbing. Skin: Warm and dry.  Sacral decubitus ulcer noted on admission  Recent Labs  Lab 03/07/2020 1519  NA 153*  K 5.5*  CL 115*  CO2 29  BUN 95*  CREATININE 3.25*  GLUCOSE 181*   Recent Labs  Lab 03/02/2020 1519  HGB 15.8  HCT 50.1  WBC 19.4*  PLT 213   CT Head Wo Contrast  Result Date: 04/01/2020 CLINICAL DATA:  Head trauma unresponsive bradycardia EXAM: CT HEAD WITHOUT CONTRAST CT CERVICAL SPINE WITHOUT CONTRAST TECHNIQUE: Multidetector CT imaging of the head and cervical spine was  performed following the standard protocol without intravenous contrast. Multiplanar CT image reconstructions of the cervical spine were also generated. COMPARISON:  CT 01/01/2016 FINDINGS: CT HEAD FINDINGS Brain: No acute territorial infarction, hemorrhage, or intracranial mass. Moderate atrophy. Moderate hypodensity in the white matter consistent with chronic small vessel ischemic change. Stable ventricle size. Vascular: No hyperdense vessels.  Carotid vascular calcification Skull: Normal. Negative for fracture or focal lesion. Sinuses/Orbits: No acute finding. Other: None CT CERVICAL SPINE FINDINGS Alignment: Straightening of the cervical spine. No subluxation. Facet alignment is maintained. Skull base and vertebrae: No acute fracture. No primary bone lesion or focal pathologic process. Soft tissues and spinal canal: No prevertebral fluid or swelling. No visible canal hematoma. Disc levels: Diffuse bulky osteophytosis throughout the cervical spine. Moderate disc space narrowing C3-C4 with mild diffuse disc space narrowing C4 through C7. Facet degenerative changes at multiple levels. Moderate canal stenosis at C3-C4. Foraminal stenosis at multiple levels. Upper chest: Negative. Other: None IMPRESSION: 1. No CT evidence for acute intracranial abnormality. Atrophy and chronic small vessel ischemic changes of the white matter. 2. Straightening of the cervical spine with degenerative changes. No acute osseous abnormality. Electronically Signed   By: Donavan Foil M.D.   On: 03/29/2020 19:24   CT Cervical Spine Wo Contrast  Result Date: 03/18/2020 CLINICAL DATA:  Head trauma unresponsive bradycardia EXAM: CT HEAD WITHOUT CONTRAST CT CERVICAL SPINE WITHOUT CONTRAST TECHNIQUE: Multidetector CT  imaging of the head and cervical spine was performed following the standard protocol without intravenous contrast. Multiplanar CT image reconstructions of the cervical spine were also generated. COMPARISON:  CT 01/01/2016  FINDINGS: CT HEAD FINDINGS Brain: No acute territorial infarction, hemorrhage, or intracranial mass. Moderate atrophy. Moderate hypodensity in the white matter consistent with chronic small vessel ischemic change. Stable ventricle size. Vascular: No hyperdense vessels.  Carotid vascular calcification Skull: Normal. Negative for fracture or focal lesion. Sinuses/Orbits: No acute finding. Other: None CT CERVICAL SPINE FINDINGS Alignment: Straightening of the cervical spine. No subluxation. Facet alignment is maintained. Skull base and vertebrae: No acute fracture. No primary bone lesion or focal pathologic process. Soft tissues and spinal canal: No prevertebral fluid or swelling. No visible canal hematoma. Disc levels: Diffuse bulky osteophytosis throughout the cervical spine. Moderate disc space narrowing C3-C4 with mild diffuse disc space narrowing C4 through C7. Facet degenerative changes at multiple levels. Moderate canal stenosis at C3-C4. Foraminal stenosis at multiple levels. Upper chest: Negative. Other: None IMPRESSION: 1. No CT evidence for acute intracranial abnormality. Atrophy and chronic small vessel ischemic changes of the white matter. 2. Straightening of the cervical spine with degenerative changes. No acute osseous abnormality. Electronically Signed   By: Donavan Foil M.D.   On: 03/10/2020 19:24   DG Chest Portable 1 View  Result Date: 03/21/2020 CLINICAL DATA:  Dyspnea EXAM: PORTABLE CHEST 1 VIEW COMPARISON:  03/13/2020 FINDINGS: Mild bibasilar atelectasis has developed. Small left pleural effusion has developed. No pneumothorax. Cardiac size is within normal limits, unchanged from prior examination. Multiple healed right rib fractures are noted. No acute bone abnormality. IMPRESSION: Mild bibasilar atelectasis and small left pleural effusion, new since prior examination. Electronically Signed   By: Fidela Salisbury MD   On: 03/09/2020 15:54    ASSESSMENT / PLAN:  Septic Shock Mildly  elevated Troponin, likely demand ischemia -Continuous cardiac monitoring -Maintain MAP greater than 65 -IV fluids -Vasopressors as needed to maintain MAP goal -Trend lactic acid -Trend troponin until downtrending -Obtain 2D Echocardiogram   Severe Sepsis in setting of suspected Meninginitis & ? Pneumonia -Monitor fever -Trend WBCs and procalcitonin -Follow cultures as above -Place on Ampicillin, Rocephin, Vancomycin, & Acyclovir -IV Decadron -ED attempted LP  (unsuccessful) -Consult Neurology, appreciate input   Acute Hypoxic Respiratory Failure in setting of ? Pneumonia & Suspected PE (given pt has occlusive DVT Right femoral) -Supplemental O2 as needed to maintain O2 sats >92% -High risk for intubation -Follow intermittent CXR & ABG as needed -Antibiotics as above -D-dimer is elevated, unable to obtain CTA Chest due to AKI, pt currently too unstable for V/Q scan -Will empirically place on Heparin gtt and obtain Bilateral LE Ultrasound   Right Lower Extremity DVT (Occlusive thrombus within the right common femoral, femoral, and popliteal veins) -Heparin gtt -Consult Vascular Surgery, appreciate input   AKI Hyperkalemia>> improved Hypernatremia -Monitor I&O's / urinary output -Follow BMP -Ensure adequate renal perfusion -Avoid nephrotoxic agents as able -Replace electrolytes as indicated -IV fluids (1/2 NS @ 100 ml/hr) -Repeat BMP pending -Consult Nephrology, appreciate input   Acute Metabolic Encephalopathy secondary to Sepsis and suspected Meningitis -Provide supportive care -Treat sepsis -CT Head 03/06/2020 negative for any acute intracranial abnormality -Plan for MR Brain, but pt currently too unstable -Neurology consulted, appreciate input   Sacral Decubitus ulcer (present on admission) -Doesn't appear grossly infected -Consult Wound Care, appreciate input, follow up recommendations   Hyperglycemia -CBG's -SSI -Follow ICU Hypo/hyperglycemia  protocol  Pt's prognosis is extremely guarded with multiorgan failure.  He is high risk for cardiac arrest and death.  Recommend DNR/DNI status.   BEST PRACTICES DISPOSITION: ICU GOALS OF CARE: Full Code VTE PROPHYLAXIS/ANTICOAGULATION: Heparin gtt CONSULTS: Neurology, Nephrology, Vascular Surgery, Palliative Care UPDATES: No family at bedside for update 03/10/2020 during NP rounds  Darel Hong, [facility] [place] Pager: [phone]  03/30/2020, 9:47 PM

## 2020-03-31 NOTE — ED Provider Notes (Signed)
[facility] Emergency Department Provider Note   ____________________________________________   First MD Initiated Contact with Patient 03/09/2020 1512     (approximate)  I have reviewed the triage vital signs and the nursing notes.   HISTORY  Chief Complaint Altered Mental Status  History limited by patient's unresponsiveness  HPI Howard Anderson is a 74 y.o. male with a past history of speech impediment due to stroke.  He was found unresponsive today at [facility] care.  Unsure of the time of initiation of unresponsiveness.  EMS reports patient did not have a radial pulse but was very hypertensive per monitor with blood pressure systolic of 409+.  Patient felt very hot.  Patient is breathing rapidly is unresponsive to pain.  No other history is available.         Past Medical History:  Diagnosis Date  . AAA (abdominal aortic aneurysm) without rupture ([place])   . AAA (abdominal aortic aneurysm) without rupture ([place])   . Cancer [facility])    prostate cancer  . Chest pain   . COPD (chronic obstructive pulmonary disease) ([place])   . Depression   . Dizziness and giddiness   . Dvt femoral (deep venous thrombosis) (H[facility])    H/O LEG  . GERD (gastroesophageal reflux disease)   . Heart disease   . Hyperlipidemia   . Hypertension   . Parkinson disease ([place])   . Prostate cancer ([place])   . Shortness of breath dyspnea    DOE  . Shoulder fracture, right   . Stroke [facility])    tia    Patient Active Problem List   Diagnosis Date Noted  . Shoulder pain 08/11/2017  . Urinary urgency 08/11/2017  . Venous insufficiency of both lower extremities 10/25/2016  . Bilateral leg edema 10/19/2016  . Prostate cancer ([place]) 09/27/2016  . Essential hypertension 10/29/2015  . Hyperlipidemia 10/29/2015  . Esophageal reflux 10/29/2015  . Centrilobular emphysema ([place]) 10/29/2015  . Labyrinthitis 10/29/2015  . Microhematuria 09/29/2015  . Femoral artery aneurysm, left ([place])  06/22/2015  . Cerebral vascular disease 06/21/2015  . AAA (abdominal aortic aneurysm) ([place]) 05/21/2015  . Degenerative disc disease, lumbar 04/24/2015  . Urinary incontinence, male, stress 04/24/2015  . Dizziness 01/20/2015  . Endocarditis 01/20/2015  . Aneurysm of abdominal vessel ([place]) 08/30/2014  . Chronic obstructive pulmonary disease ([place]) 08/30/2014  . Personal history of transient ischemic attack (TIA), and cerebral infarction without residual deficits 08/30/2014    Past Surgical History:  Procedure Laterality Date  . CATARACT EXTRACTION W/PHACO Left 09/27/2018   Procedure: CATARACT EXTRACTION PHACO AND INTRAOCULAR LENS PLACEMENT ([place]) LEFT;  Surgeon: Leandrew Koyanagi, MD;  Location: [facility] ORS;  Service: Ophthalmology;  Laterality: Left;  [place]  00:42 AP% 15.1 CDE 5.16 Fluid pack lot # [id number]  . CATARACT EXTRACTION W/PHACO Right 02/28/2019   Procedure: CATARACT EXTRACTION PHACO AND INTRAOCULAR LENS PLACEMENT ([place]) RIGHT MALYUGIN;  Surgeon: Leandrew Koyanagi, MD;  Location: [place];  Service: Ophthalmology;  Laterality: Right;  . COLONOSCOPY  2010  . EMBOLECTOMY Left 06/20/2015   Procedure: EMBOLECTOMY;  Surgeon: [name], MD;  Location: [facility] ORS;  Service: Vascular;  Laterality: Left;  brachial thrombectomy  . ENDARTERECTOMY FEMORAL Left 06/20/2015   Procedure: ENDARTERECTOMY FEMORAL/FEMORAL PSEUDOANEURYSM repair;  Surgeon: [name], MD;  Location: [facility] ORS;  Service: Vascular;  Laterality: Left;  femerol  . FRACTURE SURGERY Right    SHOULDER  . INNER EAR SURGERY    . MANDIBLE SURGERY  TUMOR  . PERIPHERAL VASCULAR CATHETERIZATION N/A 05/21/2015   Procedure: Endovascular Repair/Stent Graft;  Surgeon: [name], MD;  Location: [place] [facility];  Service: Cardiovascular;  Laterality: N/A;  . PERIPHERAL VASCULAR CATHETERIZATION  06/20/2015   Procedure: Lower Extremity Angiography;  Surgeon: [name], MD;  Location: [place] [facility];  Service: Cardiovascular;;  . PERIPHERAL VASCULAR CATHETERIZATION  06/20/2015   Procedure: Lower Extremity Intervention;  Surgeon: [name], MD;  Location: [place] [facility];  Service: Cardiovascular;;    Prior to Admission medications   Medication Sig Start Date End Date Taking? Authorizing Provider  aspirin 81 MG tablet Take 1 tablet (81 mg total) by mouth daily. 03/17/18   Juline Patch, MD  atorvastatin (LIPITOR) 10 MG tablet TAKE 1 TABLET BY MOUTH DAILY Patient taking differently: Take 10 mg by mouth every evening.  02/11/20   Juline Patch, MD  brimonidine-timolol (COMBIGAN) 0.2-0.5 % ophthalmic solution Place 1 drop into both eyes every 12 (twelve) hours.    [provider]  carbidopa-levodopa (SINEMET IR) 25-100 MG tablet Take 1.5 tablets by mouth 3 (three) times daily. Dr Manuella Ghazi    [provider]  Cholecalciferol (VITAMIN D3) 1000 UNITS CAPS Take 1,000 Units by mouth daily.     [provider]  dorzolamide (TRUSOPT) 2 % ophthalmic solution Place 1 drop into both eyes 2 (two) times daily.  07/30/16   [provider]  doxycycline (PERIOSTAT) 20 MG tablet Take 20 mg by mouth 2 (two) times daily. dentist    [provider]  famotidine (PEPCID) 40 MG tablet Take 1 tablet (40 mg total) by mouth daily. 02/11/20   Juline Patch, MD  latanoprost (XALATAN) 0.005 % ophthalmic solution Place 1 drop into both eyes at bedtime.     [provider]  Multiple Vitamins-Minerals (PRESERVISION AREDS PO) Take 1 capsule by mouth 2 (two) times daily.    [provider]  sertraline (ZOLOFT) 50 MG tablet Take 1.5 tablets (75 mg total) by mouth daily. 02/11/20   Juline Patch, MD  tamsulosin (FLOMAX) 0.4 MG CAPS capsule TAKE 1 CAPSULE BY MOUTH ONCE DAILY Patient taking differently: Take 0.4 mg by mouth daily after supper.  12/13/19   Stoioff, Ronda Fairly, MD  traZODone (DESYREL) 50 MG tablet Take 1 tablet (50 mg total) by  mouth at bedtime. Patient taking differently: Take 50 mg by mouth at bedtime as needed for sleep.  02/11/20   Juline Patch, MD  valsartan (DIOVAN) 80 MG tablet TAKE 1 TABLET (80 MG TOTAL) BY MOUTH ONCE DAILY. Patient taking differently: Take 80 mg by mouth daily.  03/17/18   Juline Patch, MD  vitamin B-12 (CYANOCOBALAMIN) 1000 MCG tablet Take 1,000 mcg by mouth daily.    [provider]    Allergies Patient has no known allergies.  Family History  Problem Relation Age of Onset  . Prostate cancer Brother   . Bladder Cancer Neg Hx   . Kidney cancer Neg Hx     Social History Social History   Tobacco Use  . Smoking status: Former Smoker    Packs/day: 1.50    Years: 48.00    Pack years: 72.00    Quit date: 10/10/2013    Years since quitting: 6.4  . Smokeless tobacco: Never Used  Vaping Use  . Vaping Use: Never used  Substance Use Topics  . Alcohol use: No  . Drug use: No    Review of  Systems Unable to obtain  ____________________________________________   PHYSICAL EXAM:  VITAL SIGNS: ED Triage Vitals  Enc Vitals Group     BP      Pulse      Resp      Temp      Temp src      SpO2      Weight      Height      Head Circumference      Peak Flow      Pain Score      Pain Loc      Pain Edu?      Excl. in [place]?     Constitutional: Unresponsive in respiratory distress Eyes: Conjunctivae are normal. PERRL. EOMI. Head: Atraumatic. Nose: No congestion/rhinnorhea. Mouth/Throat: Mucous membranes are moist.  Oropharynx non-erythematous. Neck: No stridor. Cardiovascular: Rapid rate, regular rhythm. Grossly normal heart sounds.  Good peripheral circulation. Respiratory: Increased respiratory effort.  No retractions. Lungs crackles/gurgles throughout Gastrointestinal: Soft and nontender. No distention. No abdominal bruits. Musculoskeletal: No lower extremity tenderness nor edema.  N Neurologic: Appears to be at baseline except for his  unresponsiveness. Skin:  Skin is warm, dry and intact.    ____________________________________________   LABS (all labs ordered are listed, but only abnormal results are displayed)  Labs Reviewed  COMPREHENSIVE METABOLIC PANEL - Abnormal; Notable for the following components:      Result Value   Sodium 153 (*)    Potassium 5.5 (*)    Chloride 115 (*)    Glucose, Bld 181 (*)    BUN 95 (*)    Creatinine, Ser 3.25 (*)    Calcium 7.9 (*)    Albumin 2.5 (*)    AST 72 (*)    GFR calc non Af Amer 18 (*)    GFR calc Af Amer 21 (*)    All other components within normal limits  LIPASE, BLOOD - Abnormal; Notable for the following components:   Lipase 59 (*)    All other components within normal limits  CBC WITH DIFFERENTIAL/PLATELET - Abnormal; Notable for the following components:   WBC 19.4 (*)    Neutro Abs 16.2 (*)    Monocytes Absolute 2.1 (*)    Abs Immature Granulocytes 0.18 (*)    All other components within normal limits  FIBRIN DERIVATIVES D-DIMER ([facility] ONLY) - Abnormal; Notable for the following components:   Fibrin derivatives D-dimer ([facility]) >7,500.00 (*)    All other components within normal limits  BLOOD GAS, VENOUS - Abnormal; Notable for the following components:   pO2, Ven 61.0 (*)    All other components within normal limits  LACTIC ACID, PLASMA - Abnormal; Notable for the following components:   Lactic Acid, Venous 2.5 (*)    All other components within normal limits  PROTIME-INR - Abnormal; Notable for the following components:   Prothrombin Time 17.7 (*)    INR 1.5 (*)    All other components within normal limits  URINALYSIS, COMPLETE (UACMP) WITH MICROSCOPIC - Abnormal; Notable for the following components:   Color, Urine AMBER (*)    APPearance HAZY (*)    Hgb urine dipstick SMALL (*)    All other components within normal limits  TROPONIN I (HIGH SENSITIVITY) - Abnormal; Notable for the following components:   Troponin I (High Sensitivity) 137 (*)     All other components within normal limits  TROPONIN I (HIGH SENSITIVITY) - Abnormal; Notable for the following components:   Troponin I (High Sensitivity) 136 (*)  All other components within normal limits  SARS CORONAVIRUS 2 BY RT PCR (HOSPITAL ORDER, [place] [facility])  URINE CULTURE  CULTURE, BLOOD (SINGLE)  LACTIC ACID, PLASMA  APTT  LACTIC ACID, PLASMA  LACTIC ACID, PLASMA  CBC WITH DIFFERENTIAL/PLATELET  BASIC METABOLIC PANEL  PROCALCITONIN  PROCALCITONIN  CBC WITH DIFFERENTIAL/PLATELET  COMPREHENSIVE METABOLIC PANEL   ____________________________________________  EKG  EKG read interpreted by me shows sinus tach rate of 118 rightward axis decreased R wave progression in the chest leads.  Decreased amplitude as well. ____________________________________________  RADIOLOGY  ED MD interpretation: Chest x-ray read by radiologist reviewed by me shows new bibasilar atelectasis and small left pleural effusion.  I reviewed the films and agree.  Official radiology report(s): CT Head Wo Contrast  Result Date: 03/14/2020 CLINICAL DATA:  Head trauma unresponsive bradycardia EXAM: CT HEAD WITHOUT CONTRAST CT CERVICAL SPINE WITHOUT CONTRAST TECHNIQUE: Multidetector CT imaging of the head and cervical spine was performed following the standard protocol without intravenous contrast. Multiplanar CT image reconstructions of the cervical spine were also generated. COMPARISON:  CT 01/01/2016 FINDINGS: CT HEAD FINDINGS Brain: No acute territorial infarction, hemorrhage, or intracranial mass. Moderate atrophy. Moderate hypodensity in the white matter consistent with chronic small vessel ischemic change. Stable ventricle size. Vascular: No hyperdense vessels.  Carotid vascular calcification Skull: Normal. Negative for fracture or focal lesion. Sinuses/Orbits: No acute finding. Other: None CT CERVICAL SPINE FINDINGS Alignment: Straightening of the cervical spine. No subluxation.  Facet alignment is maintained. Skull base and vertebrae: No acute fracture. No primary bone lesion or focal pathologic process. Soft tissues and spinal canal: No prevertebral fluid or swelling. No visible canal hematoma. Disc levels: Diffuse bulky osteophytosis throughout the cervical spine. Moderate disc space narrowing C3-C4 with mild diffuse disc space narrowing C4 through C7. Facet degenerative changes at multiple levels. Moderate canal stenosis at C3-C4. Foraminal stenosis at multiple levels. Upper chest: Negative. Other: None IMPRESSION: 1. No CT evidence for acute intracranial abnormality. Atrophy and chronic small vessel ischemic changes of the white matter. 2. Straightening of the cervical spine with degenerative changes. No acute osseous abnormality. Electronically Signed   By: Donavan Foil M.D.   On: 03/09/2020 19:24   CT Cervical Spine Wo Contrast  Result Date: 03/19/2020 CLINICAL DATA:  Head trauma unresponsive bradycardia EXAM: CT HEAD WITHOUT CONTRAST CT CERVICAL SPINE WITHOUT CONTRAST TECHNIQUE: Multidetector CT imaging of the head and cervical spine was performed following the standard protocol without intravenous contrast. Multiplanar CT image reconstructions of the cervical spine were also generated. COMPARISON:  CT 01/01/2016 FINDINGS: CT HEAD FINDINGS Brain: No acute territorial infarction, hemorrhage, or intracranial mass. Moderate atrophy. Moderate hypodensity in the white matter consistent with chronic small vessel ischemic change. Stable ventricle size. Vascular: No hyperdense vessels.  Carotid vascular calcification Skull: Normal. Negative for fracture or focal lesion. Sinuses/Orbits: No acute finding. Other: None CT CERVICAL SPINE FINDINGS Alignment: Straightening of the cervical spine. No subluxation. Facet alignment is maintained. Skull base and vertebrae: No acute fracture. No primary bone lesion or focal pathologic process. Soft tissues and spinal canal: No prevertebral fluid or  swelling. No visible canal hematoma. Disc levels: Diffuse bulky osteophytosis throughout the cervical spine. Moderate disc space narrowing C3-C4 with mild diffuse disc space narrowing C4 through C7. Facet degenerative changes at multiple levels. Moderate canal stenosis at C3-C4. Foraminal stenosis at multiple levels. Upper chest: Negative. Other: None IMPRESSION: 1. No CT evidence for acute intracranial abnormality. Atrophy and chronic small vessel ischemic changes of  the white matter. 2. Straightening of the cervical spine with degenerative changes. No acute osseous abnormality. Electronically Signed   By: Donavan Foil M.D.   On: 03/11/2020 19:24   DG Chest Portable 1 View  Result Date: 03/21/2020 CLINICAL DATA:  Dyspnea EXAM: PORTABLE CHEST 1 VIEW COMPARISON:  03/13/2020 FINDINGS: Mild bibasilar atelectasis has developed. Small left pleural effusion has developed. No pneumothorax. Cardiac size is within normal limits, unchanged from prior examination. Multiple healed right rib fractures are noted. No acute bone abnormality. IMPRESSION: Mild bibasilar atelectasis and small left pleural effusion, new since prior examination. Electronically Signed   By: Fidela Salisbury MD   On: 03/10/2020 15:54    ____________________________________________   PROCEDURES  Procedure(s) performed (including Critical Care): Critical care time 1 hour and 45 minutes.  This includes a good deal of time spent at the patient's bedside while we were examining him and adjusting his fluids and Levophed.  It also includes the time spent doing a spinal tap and talking to the intensivist and attempting to contact radiology to get a fluoroscopically guided spinal tap.  Procedures Patient with 105 fever.  I would turn down the temperature in the room to 66 right my hands and close applied water to the patient's skin.  We will get him some cool saline and some ice packs as we do not seem to have the [place] sun any  longer. ----------------------------------------- 8:58 PM on 03/09/2020 -----------------------------------------  Patient neck remains stiff.  CT shows no intracranial pathology that could cause herniation.  Unable to get consent but this is an emergency procedure therefore patient prepped and draped in usual sterile fashion 1% lidocaine injected to anesthetize the skin and I attempted spinal tap at L4-5 level needle went into the hub I cannot obtain any fluid even with rotating the needle and withdrawing it slowly and I went up to the L3-4 interspace and again needle was inserted up to the hub and unable to obtain any fluid there either.  I am unwilling to try again higher.  I will consult interventional radiology for a further tap attempt.  In the meantime we will try to get this gentleman admitted to the ICU. ____________________________________________   INITIAL IMPRESSION / ASSESSMENT AND PLAN / ED COURSE  ----------------------------------------- 4:10 PM on 03/04/2020 -----------------------------------------  Patient's labs have mostly returned.  His sodium is 153 potassium is 5.5 his GFR is 18.  Just 18 days ago all these parameters were normal.  His BUN is 95 creatinine is 3.5 troponin is 137 am not sure if this is something new or due to his renal failure.  His white count is 19,000 he has had about a liter of fluid at this point and his pressure still low.  We will give him the rest of the 2 L and then try to see if his pressure will come up if not we will have to start some pressors.  I do want to get the CT scan before I do a spinal tap on this gentleman I cannot evaluate his neurological function see if he has any focal findings and is a history of an old stroke.  He could easily have a mass lesion of some type in his brain.  Hemorrhagic stroke or abscess etc. and doing a spinal tap without the CT might cause him to herniate and kill him.  In the meantime he is getting his antibiotics  and he is already had his dexamethasone.        -----------------------------------------  5:45 PM on 03/10/2020 -----------------------------------------  Patient's troponin is elevated.  His D-dimer is elevated.  His white count is elevated.  His blood pressures come up a little bit after 2 L of fluid and his temperature is down to 100.4 we remove the ice packs.  We are waiting on the SARS test to come back.  His creatinine is too high to do a CT angio.  If he stabilizes more perhaps we can do a VQ scan.  He may just have coronavirus as well.  He also may just have a pneumonia that has not ballooned because he has been very dehydrated.  His O2 was 98% sat on the nonrebreather which is not fitting him very tightly.  ----------------------------------------- 9:41 PM on 03/14/2020 -----------------------------------------  We are unable to rule out meningitis at this point but we are treating him for it.  I am attempting to contact radiology to get a fluoroscopically guided spinal tap.  If I have a chance I will try to put a central line in this gentleman otherwise intensivist will try.  They are aware that he does not have a central line at this point.         ____________________________________________   FINAL CLINICAL IMPRESSION(S) / ED DIAGNOSES  Final diagnoses:  Altered mental status, unspecified altered mental status type  Sepsis, due to unspecified organism, unspecified whether acute organ dysfunction present [facility]    ED Discharge Orders    None       Note:  This document was prepared using Dragon voice recognition software and may include unintentional dictation errors.    Nena Polio, MD 03/03/2020 2141

## 2020-03-31 NOTE — ED Triage Notes (Signed)
Pt arrived via [facility] from [facility] was pt was reported to be unresponsive at 0700, pt LKW unknown. Pt was 84% on RA on arrival w/ RR of 30-35 BPM. Pt had CBG of 183. Pt placed on NRB at 12L and came to 97%.

## 2020-03-31 NOTE — ED Notes (Signed)
Date and time results received: 03/07/2020 2249 (use smartphrase ".now" to insert current time)  Test: Lactic acid Critical Value: 2.7  Name of Provider Notified: Roderic Palau, NP  Orders Received? Or Actions Taken?: provider notified

## 2020-03-31 NOTE — ED Notes (Signed)
ICU NP at bedside.

## 2020-03-31 NOTE — ED Notes (Signed)
Pt placed on 6L [place] per order of EDP

## 2020-03-31 NOTE — Consult Note (Signed)
ANTICOAGULATION CONSULT NOTE  Pharmacy Consult for Heparin Infusion Indication: High suspicion for PE  No Known Allergies  Patient Measurements: Height: 5\' 7"  (170.2 cm) Weight: 97.5 kg (215 lb) IBW/kg (Calculated) : 66.1 Heparin Dosing Weight: 87.1 kg  Vital Signs: Temp: 97.8 F (36.6 C) (08/30 2140) Temp Source: Rectal (08/30 1523) BP: 100/62 (08/30 2140) Pulse Rate: 96 (08/30 2140)  Labs: Recent Labs    03/20/2020 1519 03/27/2020 1718  HGB 15.8  --   HCT 50.1  --   PLT 213  --   APTT 33  --   LABPROT 17.7*  --   INR 1.5*  --   CREATININE 3.25*  --   TROPONINIHS 137* 136*    Estimated Creatinine Clearance: 22.2 mL/min (A) (by C-G formula based on SCr of 3.25 mg/dL (H)).   Medical History: Past Medical History:  Diagnosis Date  . AAA (abdominal aortic aneurysm) without rupture ([place])   . AAA (abdominal aortic aneurysm) without rupture ([place])   . Cancer [facility])    prostate cancer  . Chest pain   . COPD (chronic obstructive pulmonary disease) ([place])   . Depression   . Dizziness and giddiness   . Dvt femoral (deep venous thrombosis) ([facility])    H/O LEG  . GERD (gastroesophageal reflux disease)   . Heart disease   . Hyperlipidemia   . Hypertension   . Parkinson disease ([place])   . Prostate cancer ([place])   . Shortness of breath dyspnea    DOE  . Shoulder fracture, right   . Stroke [facility])    tia    Medications:  No anticoagulation prior to admission per chart review  Assessment: Patient is a 74 y/o M with medical history including COPD, stroke, DVT, prostate cancer, AAA who presented to the ED 8/30 from [facility] [place] after being found unresponsive. There is concern for meningitis and patient has been started on empiric antibiotics. Patient is also hypoxic with elevated D-dimer with further concern for PE. Pharmacy has been consulted to initiate heparin infusion for high suspicion of PE.    Baseline H&H within normal limits. Baseline aPTT within normal limits.  Baseline INR elevated to 1.5.  Goal of Therapy:  Heparin level 0.3-0.7 units/ml Monitor platelets by anticoagulation protocol: Yes   Plan:  --Heparin 4000 unit IV bolus x 1 followed by continuous infusion at 1400 units/hr --Heparin level 8 hours after initiation of infusion --Daily CBC per protocol  Benita Gutter 03/21/2020,10:08 PM

## 2020-03-31 NOTE — ED Notes (Signed)
Pt is laying in bed. Pt has cardiac, bp and pulse ox monitor on and temperature foley. Pt responds to pain. Previous RN stated skin break down to the back. RN unable to turn pt at this time, as RN does not have help to turn pt. Pt is on 6LPM via [place] of oxygen

## 2020-03-31 NOTE — Consult Note (Signed)
Pharmacy Antibiotic Note  Howard Anderson is a 74 y.o. male admitted on 03/18/2020 with meningitis.  Pharmacy has been consulted for vancomycin and acyclovir dosing. Patient is also ordered ceftriaxone 2 g q12h + ampicillin 2 g q8h.   Labs on admission notable for a Scr of 3.25 (baseline 0.8 - 1)  Plan: Ampicillin 2 g q8h (renally adjusted)  Ceftriaxone 2 g q12h  Acyclovir 750 mg (10 mg/kg AdjBW) q24h (renally adjusted)  Vancomycin 2 g IV LD in ED. Will dose per levels for now given unstable renal function   Height: 5\' 7"  (170.2 cm) Weight: 97.5 kg (215 lb) IBW/kg (Calculated) : 66.1  Temp (24hrs), Avg:99.4 F (37.4 C), Min:98 F (36.7 C), Max:105.1 F (40.6 C)  Recent Labs  Lab 03/22/2020 1519 03/03/2020 1718  WBC 19.4*  --   CREATININE 3.25*  --   LATICACIDVEN 2.5* 1.8    Estimated Creatinine Clearance: 22.2 mL/min (A) (by C-G formula based on SCr of 3.25 mg/dL (H)).    No Known Allergies  Antimicrobials this admission: Cefepime 8/30 x 1  Acyclovir 8/30 >>  Ampicillin 8/30 >>  Ceftriaxone 8/30 >>  Vancomycin 8/30 >>   Dose adjustments this admission: n/a  Microbiology results: 8/30 BCx: pending 8/30 UCx: pending  8/30 SARS-CoV-2: negative  Thank you for allowing pharmacy to be a part of this patient's care.  Benita Gutter 03/04/2020 9:53 PM

## 2020-03-31 NOTE — ED Notes (Signed)
Provider attempted to do LP, pt was unable to get spinal fluid. 2 mepilex pads placed on lower back, due to skin tears and breakdown. Pt laying on back. ER provider aware of bp of 81/42

## 2020-04-01 ENCOUNTER — Inpatient Hospital Stay: Admit: 2020-04-01 | Payer: Medicare Other

## 2020-04-01 ENCOUNTER — Inpatient Hospital Stay
Admit: 2020-04-01 | Discharge: 2020-04-01 | Disposition: A | Discharge status: Home / Self Care | Payer: Medicare Other | Attending: Pulmonary Disease | Admitting: Pulmonary Disease

## 2020-04-01 ENCOUNTER — Inpatient Hospital Stay: Payer: Medicare Other

## 2020-04-01 DIAGNOSIS — A419 Sepsis, unspecified organism: Principal | ICD-10-CM

## 2020-04-01 DIAGNOSIS — R7989 Other specified abnormal findings of blood chemistry: Secondary | ICD-10-CM

## 2020-04-01 DIAGNOSIS — R4182 Altered mental status, unspecified: Secondary | ICD-10-CM

## 2020-04-01 DIAGNOSIS — J9601 Acute respiratory failure with hypoxia: Secondary | ICD-10-CM

## 2020-04-01 LAB — BLOOD CULTURE ID PANEL (REFLEXED) - BCID2

## 2020-04-01 LAB — STREP PNEUMONIAE URINARY ANTIGEN: Strep Pneumo Urinary Antigen: NEGATIVE

## 2020-04-01 LAB — GLUCOSE, CAPILLARY
Glucose-Capillary: 130 mg/dL — ABNORMAL HIGH (ref 70–99)
Glucose-Capillary: 149 mg/dL — ABNORMAL HIGH (ref 70–99)
Glucose-Capillary: 162 mg/dL — ABNORMAL HIGH (ref 70–99)
Glucose-Capillary: 177 mg/dL — ABNORMAL HIGH (ref 70–99)
Glucose-Capillary: 179 mg/dL — ABNORMAL HIGH (ref 70–99)
Glucose-Capillary: 184 mg/dL — ABNORMAL HIGH (ref 70–99)
Glucose-Capillary: 220 mg/dL — ABNORMAL HIGH (ref 70–99)

## 2020-04-01 LAB — ECHOCARDIOGRAM COMPLETE
Height: 67 in
S' Lateral: 2.13 cm
Weight: 3439.99 oz

## 2020-04-01 LAB — HEPARIN LEVEL (UNFRACTIONATED): Heparin Unfractionated: 1.8 IU/mL — ABNORMAL HIGH (ref 0.30–0.70)

## 2020-04-01 LAB — LACTIC ACID, PLASMA
Lactic Acid, Venous: 1.3 mmol/L (ref 0.5–1.9)
Lactic Acid, Venous: 1 mmol/L (ref 0.5–1.9)

## 2020-04-01 LAB — MRSA PCR SCREENING: MRSA by PCR: NEGATIVE

## 2020-04-01 MED ORDER — PHENYLEPHRINE CONCENTRATED 100MG/250ML (0.4 MG/ML) INFUSION SIMPLE
0.0000 ug/min | INTRAVENOUS | Status: DC
  Administered 2020-04-01: 20 ug/min via INTRAVENOUS
  Filled 2020-04-01: qty 250

## 2020-04-01 MED ORDER — GADOBUTROL 1 MMOL/ML IV SOLN
9.0000 mL | Freq: Once | INTRAVENOUS | Status: AC | PRN
  Administered 2020-04-01: 9 mL via INTRAVENOUS

## 2020-04-01 MED ORDER — FENTANYL CITRATE (PF) 100 MCG/2ML IJ SOLN
INTRAMUSCULAR | Status: DC | PRN
  Administered 2020-04-01: 100 ug via INTRAVENOUS

## 2020-04-01 MED ORDER — DEXAMETHASONE SODIUM PHOSPHATE 10 MG/ML IJ SOLN
10.0000 mg | Freq: Four times a day (QID) | INTRAMUSCULAR | Status: DC
  Filled 2020-04-01 (×3): qty 1

## 2020-04-01 MED ORDER — INSULIN ASPART 100 UNIT/ML ~~LOC~~ SOLN
0.0000 [IU] | SUBCUTANEOUS | Status: DC
  Administered 2020-04-01 (×2): 3 [IU] via SUBCUTANEOUS
  Filled 2020-04-01 (×2): qty 1

## 2020-04-01 MED ORDER — CHLORHEXIDINE GLUCONATE CLOTH 2 % EX PADS
6.0000 | MEDICATED_PAD | Freq: Every day | CUTANEOUS | Status: DC
  Administered 2020-04-01: 6 via TOPICAL

## 2020-04-01 MED ORDER — CHLORHEXIDINE GLUCONATE 0.12% ORAL RINSE (MEDLINE KIT)
15.0000 mL | Freq: Two times a day (BID) | OROMUCOSAL | Status: DC
  Administered 2020-04-01 (×2): 15 mL via OROMUCOSAL

## 2020-04-01 MED ORDER — INSULIN ASPART 100 UNIT/ML ~~LOC~~ SOLN
0.0000 [IU] | SUBCUTANEOUS | Status: DC
  Administered 2020-04-01: 5 [IU] via SUBCUTANEOUS
  Administered 2020-04-01: 3 [IU] via SUBCUTANEOUS
  Filled 2020-04-01 (×2): qty 1

## 2020-04-01 MED ORDER — SODIUM CHLORIDE 0.45 % IV BOLUS
1000.0000 mL | Freq: Once | INTRAVENOUS | Status: AC
  Administered 2020-04-01: 1000 mL via INTRAVENOUS

## 2020-04-01 MED ORDER — ROCURONIUM BROMIDE 50 MG/5ML IV SOLN
INTRAVENOUS | Status: DC | PRN
  Administered 2020-04-01: 40 mg via INTRAVENOUS

## 2020-04-01 MED ORDER — MIDAZOLAM HCL 5 MG/5ML IJ SOLN
INTRAMUSCULAR | Status: DC | PRN
  Administered 2020-04-01: 2 mg via INTRAVENOUS

## 2020-04-01 MED ORDER — NOREPINEPHRINE 16 MG/250ML-% IV SOLN
0.0000 ug/min | INTRAVENOUS | Status: DC
  Administered 2020-04-01: 16 ug/min via INTRAVENOUS
  Administered 2020-04-01: 25 ug/min via INTRAVENOUS
  Filled 2020-04-01 (×3): qty 250

## 2020-04-01 MED ORDER — ORAL CARE MOUTH RINSE
15.0000 mL | OROMUCOSAL | Status: DC
  Administered 2020-04-01 (×4): 15 mL via OROMUCOSAL

## 2020-04-01 MED ORDER — HYDROCORTISONE NA SUCCINATE PF 100 MG IJ SOLR: 50.0000 mg | Freq: Four times a day (QID) | INTRAMUSCULAR | Status: DC

## 2020-04-01 MED ORDER — FENTANYL 2500MCG IN NS 250ML (10MCG/ML) PREMIX INFUSION
0.0000 ug/h | INTRAVENOUS | Status: DC
  Administered 2020-04-01: 25 ug/h via INTRAVENOUS

## 2020-04-01 MED FILL — Sodium Chloride IV Soln 0.9%: INTRAVENOUS | Qty: 250 | Status: AC

## 2020-04-01 MED FILL — Norepinephrine Bitartrate IV Soln 1 MG/ML (Base Equivalent): INTRAVENOUS | Qty: 4 | Status: AC

## 2020-04-01 NOTE — Progress Notes (Signed)
*  PRELIMINARY RESULTS* Echocardiogram 2D Echocardiogram has been performed.  Sherrie Sport 04/01/2020, 12:36 PM

## 2020-04-01 NOTE — Consult Note (Signed)
Requesting Physician: Dr. Laural Roes    Chief Complaint: Altered mental status, fever  History obtained from: Chart review  HPI:                                                                                                                                       Howard Anderson is a 74 y.o. male with past medical history significant for prostate cancer, COPD, DVT, hyperlipidemia, hypertension, Parkinson's disease, stroke presents to [facility] emergency department with severe sepsis and shock as well as acute renal failure.  Patient intubated and admitted to ICU and started on pressors.  Patient altered and noted to have nuchal rigidity and therefore patient started on broad-spectrum antibiotics Vanco and Rocephin as well as acyclovir and ampicillin for empiric meningitis coverage. LP attempted at bedside by EDP however unable to be performed.  Patient too unstable for LP under IR.  Patient also started on heparin drip for right lower extremity DVT.  Neurology was consulted for recommendations regarding meningitis.     Past Medical History:  Diagnosis Date  . AAA (abdominal aortic aneurysm) without rupture ([place])   . AAA (abdominal aortic aneurysm) without rupture ([place])   . Cancer [facility])    prostate cancer  . Chest pain   . COPD (chronic obstructive pulmonary disease) ([place])   . Depression   . Dizziness and giddiness   . Dvt femoral (deep venous thrombosis) (H[facility])    H/O LEG  . GERD (gastroesophageal reflux disease)   . Heart disease   . Hyperlipidemia   . Hypertension   . Parkinson disease ([place])   . Prostate cancer ([place])   . Shortness of breath dyspnea    DOE  . Shoulder fracture, right   . Stroke [facility])    tia    Past Surgical History:  Procedure Laterality Date  . CATARACT EXTRACTION W/PHACO Left 09/27/2018   Procedure: CATARACT EXTRACTION PHACO AND INTRAOCULAR LENS PLACEMENT ([place]) LEFT;  Surgeon: Leandrew Koyanagi, MD;  Location: [facility] ORS;  Service: Ophthalmology;  Laterality:  Left;  [place]  00:42 AP% 15.1 CDE 5.16 Fluid pack lot # [id number]  . CATARACT EXTRACTION W/PHACO Right 02/28/2019   Procedure: CATARACT EXTRACTION PHACO AND INTRAOCULAR LENS PLACEMENT ([place]) RIGHT MALYUGIN;  Surgeon: Leandrew Koyanagi, MD;  Location: [place];  Service: Ophthalmology;  Laterality: Right;  . COLONOSCOPY  2010  . EMBOLECTOMY Left 06/20/2015   Procedure: EMBOLECTOMY;  Surgeon: [name], MD;  Location: [facility] ORS;  Service: Vascular;  Laterality: Left;  brachial thrombectomy  . ENDARTERECTOMY FEMORAL Left 06/20/2015   Procedure: ENDARTERECTOMY FEMORAL/FEMORAL PSEUDOANEURYSM repair;  Surgeon: [name], MD;  Location: [facility] ORS;  Service: Vascular;  Laterality: Left;  femerol  . FRACTURE SURGERY Right    SHOULDER  . INNER EAR SURGERY    . MANDIBLE SURGERY     TUMOR  . PERIPHERAL VASCULAR CATHETERIZATION N/A 05/21/2015   Procedure: Endovascular Repair/Stent  Graft;  Surgeon: [name], MD;  Location: [place] CV LAB;  Service: Cardiovascular;  Laterality: N/A;  . PERIPHERAL VASCULAR CATHETERIZATION  06/20/2015   Procedure: Lower Extremity Angiography;  Surgeon: [name], MD;  Location: [place] CV LAB;  Service: Cardiovascular;;  . PERIPHERAL VASCULAR CATHETERIZATION  06/20/2015   Procedure: Lower Extremity Intervention;  Surgeon: [name], MD;  Location: [place] CV LAB;  Service: Cardiovascular;;    Family History  Problem Relation Age of Onset  . Prostate cancer Brother   . Bladder Cancer Neg Hx   . Kidney cancer Neg Hx    Social History:  reports that he quit smoking about 6 years ago. He has a 72.00 pack-year smoking history. He has never used smokeless tobacco. He reports that he does not drink alcohol and does not use drugs.  Allergies: No Known Allergies  Medications:                                                                                                                        I reviewed home  medications   ROS:                                                                                                                                     Unable to obtain due to patient's mental status   Examination:                                                                                                      General: Appears well-developed and well-nourished.  Psych: Unable to obtain due to intubated sedation Eyes: No scleral injection HENT: No OP obstrucion, nuchal rigidity present Head: Normocephalic.  Cardiovascular: Normal rate and regular rhythm.  Respiratory: Effort normal and breath sounds normal to anterior ascultation GI: Soft.  No distension. There is no tenderness.  Skin: WDI    Neurological Examination  Mental Status: Patient does not respond to verbal stimuli.  Does not respond  to deep sternal rub.  Does not follow commands. Cranial Nerves: II: Pupils are bilateral equal and reactive III,IV,VI: doll's response present V,VII: corneal reflex: Present VIII: patient does not respond to verbal stimuli IX,X: gag reflex present XI: trapezius strength unable to test bilaterally XII: tongue strength unable to test Motor: Withdraws all 4 extremities equally against gravity Sensory: Does not respond to noxious stimuli in any extremity. Plantars: Bilateral downgoing Cerebellar: Unable to perform     Lab Results: Basic Metabolic Panel: Recent Labs  Lab 03/10/2020 1519 03/13/2020 2204  NA 153* 154*  K 5.5* 5.0  CL 115* 118*  CO2 29 24  GLUCOSE 181* 206*  BUN 95* 88*  CREATININE 3.25* 2.75*  CALCIUM 7.9* 6.9*    CBC: Recent Labs  Lab 03/21/2020 1519 04/01/2020 2204  WBC 19.4* 18.6*  NEUTROABS 16.2* 15.7*  HGB 15.8 14.2  HCT 50.1 45.9  MCV 94.0 97.7  PLT 213 189    Coagulation Studies: Recent Labs    03/27/2020 1519  LABPROT 17.7*  INR 1.5*    Imaging: DG Chest 1 View  Result Date: 04/01/2020 CLINICAL DATA:  Right internal jugular central venous  catheter placement, history of dyspnea EXAM: CHEST  1 VIEW COMPARISON:  03/14/2020 FINDINGS: Two frontal views of the chest demonstrate right internal jugular catheter tip overlying superior vena cava. The cardiac silhouette is unchanged. Patchy bibasilar consolidation and likely left pleural effusion unchanged since previous exam. No evidence of pneumothorax on this supine projection. No acute bony abnormalities. IMPRESSION: 1. Right internal jugular catheter overlying superior vena cava. 2. Persistent bibasilar consolidation and left effusion. 3. No evidence of pneumothorax on this supine exam. Electronically Signed   By: Randa Ngo M.D.   On: 04/01/2020 00:48   CT Head Wo Contrast  Result Date: 03/25/2020 CLINICAL DATA:  Head trauma unresponsive bradycardia EXAM: CT HEAD WITHOUT CONTRAST CT CERVICAL SPINE WITHOUT CONTRAST TECHNIQUE: Multidetector CT imaging of the head and cervical spine was performed following the standard protocol without intravenous contrast. Multiplanar CT image reconstructions of the cervical spine were also generated. COMPARISON:  CT 01/01/2016 FINDINGS: CT HEAD FINDINGS Brain: No acute territorial infarction, hemorrhage, or intracranial mass. Moderate atrophy. Moderate hypodensity in the white matter consistent with chronic small vessel ischemic change. Stable ventricle size. Vascular: No hyperdense vessels.  Carotid vascular calcification Skull: Normal. Negative for fracture or focal lesion. Sinuses/Orbits: No acute finding. Other: None CT CERVICAL SPINE FINDINGS Alignment: Straightening of the cervical spine. No subluxation. Facet alignment is maintained. Skull base and vertebrae: No acute fracture. No primary bone lesion or focal pathologic process. Soft tissues and spinal canal: No prevertebral fluid or swelling. No visible canal hematoma. Disc levels: Diffuse bulky osteophytosis throughout the cervical spine. Moderate disc space narrowing C3-C4 with mild diffuse disc space  narrowing C4 through C7. Facet degenerative changes at multiple levels. Moderate canal stenosis at C3-C4. Foraminal stenosis at multiple levels. Upper chest: Negative. Other: None IMPRESSION: 1. No CT evidence for acute intracranial abnormality. Atrophy and chronic small vessel ischemic changes of the white matter. 2. Straightening of the cervical spine with degenerative changes. No acute osseous abnormality. Electronically Signed   By: Donavan Foil M.D.   On: 03/10/2020 19:24   CT Cervical Spine Wo Contrast  Result Date: 03/15/2020 CLINICAL DATA:  Head trauma unresponsive bradycardia EXAM: CT HEAD WITHOUT CONTRAST CT CERVICAL SPINE WITHOUT CONTRAST TECHNIQUE: Multidetector CT imaging of the head and cervical spine was performed following the standard protocol without intravenous contrast. Multiplanar CT image  reconstructions of the cervical spine were also generated. COMPARISON:  CT 01/01/2016 FINDINGS: CT HEAD FINDINGS Brain: No acute territorial infarction, hemorrhage, or intracranial mass. Moderate atrophy. Moderate hypodensity in the white matter consistent with chronic small vessel ischemic change. Stable ventricle size. Vascular: No hyperdense vessels.  Carotid vascular calcification Skull: Normal. Negative for fracture or focal lesion. Sinuses/Orbits: No acute finding. Other: None CT CERVICAL SPINE FINDINGS Alignment: Straightening of the cervical spine. No subluxation. Facet alignment is maintained. Skull base and vertebrae: No acute fracture. No primary bone lesion or focal pathologic process. Soft tissues and spinal canal: No prevertebral fluid or swelling. No visible canal hematoma. Disc levels: Diffuse bulky osteophytosis throughout the cervical spine. Moderate disc space narrowing C3-C4 with mild diffuse disc space narrowing C4 through C7. Facet degenerative changes at multiple levels. Moderate canal stenosis at C3-C4. Foraminal stenosis at multiple levels. Upper chest: Negative. Other: None  IMPRESSION: 1. No CT evidence for acute intracranial abnormality. Atrophy and chronic small vessel ischemic changes of the white matter. 2. Straightening of the cervical spine with degenerative changes. No acute osseous abnormality. Electronically Signed   By: Donavan Foil M.D.   On: 03/25/2020 19:24   US Venous Img Lower Bilateral (DVT)  Result Date: 04/01/2020 CLINICAL DATA:  Elevated D-dimer EXAM: BILATERAL LOWER EXTREMITY VENOUS DOPPLER ULTRASOUND TECHNIQUE: Gray-scale sonography with graded compression, as well as color Doppler and duplex ultrasound were performed to evaluate the lower extremity deep venous systems from the level of the common femoral vein and including the common femoral, femoral, profunda femoral, popliteal and calf veins including the posterior tibial, peroneal and gastrocnemius veins when visible. The superficial great saphenous vein was also interrogated. Spectral Doppler was utilized to evaluate flow at rest and with distal augmentation maneuvers in the common femoral, femoral and popliteal veins. COMPARISON:  None. FINDINGS: RIGHT LOWER EXTREMITY Common Femoral Vein: Noncompressible occlusive thrombosis Saphenofemoral Junction: No evidence of thrombus. Normal compressibility and flow on color Doppler imaging. Profunda Femoral Vein: No evidence of thrombus. Normal compressibility and flow on color Doppler imaging. Femoral Vein: Occlusive thrombus, noncompressible Popliteal Vein: Noncompressible occlusive thrombus Calf Veins: There is occlusive thrombus within the posterior tibial and peroneal veins. Superficial Great Saphenous Vein: No evidence of thrombus. Normal compressibility. Venous Reflux:  None. Other Findings: Fluid collection in the right popliteal fossa measures 3.7 x 1.5 cm LEFT LOWER EXTREMITY Common Femoral Vein: No evidence of thrombus. Normal compressibility, respiratory phasicity and response to augmentation. Saphenofemoral Junction: No evidence of thrombus. Normal  compressibility and flow on color Doppler imaging. Profunda Femoral Vein: No evidence of thrombus. Normal compressibility and flow on color Doppler imaging. Femoral Vein: No evidence of thrombus. Normal compressibility, respiratory phasicity and response to augmentation. Popliteal Vein: No evidence of thrombus. Normal compressibility, respiratory phasicity and response to augmentation. Calf Veins: No evidence of thrombus. Normal compressibility and flow on color Doppler imaging. Superficial Great Saphenous Vein: No evidence of thrombus. Normal compressibility. Venous Reflux:  None. Other Findings:  None. IMPRESSION: 1. Occlusive thrombus within the right common femoral, femoral, and popliteal veins. Occlusive thrombus within the right posterior tibial and peroneal veins. 2. No deep venous thrombosis on the left. 3. Right Baker's cyst measuring up to 3.7 cm. Critical Value/emergent results were called by telephone at the time of interpretation on 04/01/2020 at 2:49 am to provider [facility] , who verbally acknowledged these results. Electronically Signed   By: Ulyses Jarred M.D.   On: 04/01/2020 02:50   DG Chest [facility]  Result Date: 04/01/2020 CLINICAL DATA:  Intubation, orogastric tube placement EXAM: PORTABLE CHEST 1 VIEW COMPARISON:  Portable exam 1102 hours compared to 04/01/2020 FINDINGS: Nasogastric tube extends into stomach. Tip of endotracheal tube projects 3.5 cm above carina. RIGHT jugular line tip projecting over SVC. Normal heart size and mediastinal contours. Atherosclerotic calcification aorta. Atelectasis versus consolidation at LEFT base. Subsegmental atelectasis RIGHT base. Upper lungs clear. Small LEFT pleural effusion questioned. No pneumothorax. IMPRESSION: RIGHT basilar atelectasis with atelectasis versus consolidation and suspected small LEFT pleural effusion at LEFT lung base. Electronically Signed   By: Lavonia Dana M.D.   On: 04/01/2020 11:27   DG Chest Portable 1 View  Result  Date: 03/13/2020 CLINICAL DATA:  Dyspnea EXAM: PORTABLE CHEST 1 VIEW COMPARISON:  03/13/2020 FINDINGS: Mild bibasilar atelectasis has developed. Small left pleural effusion has developed. No pneumothorax. Cardiac size is within normal limits, unchanged from prior examination. Multiple healed right rib fractures are noted. No acute bone abnormality. IMPRESSION: Mild bibasilar atelectasis and small left pleural effusion, new since prior examination. Electronically Signed   By: Fidela Salisbury MD   On: 03/09/2020 15:54   ECHOCARDIOGRAM COMPLETE  Result Date: 04/01/2020    ECHOCARDIOGRAM REPORT   Patient Name:   QUANTAVIUS HUMM Date of Exam: 04/01/2020 Medical Rec #:  [id number]      Height:       67.0 in Accession #:    [id number]     Weight:       215.0 lb Date of Birth:  February 10, 1946      BSA:          2.085 m Patient Age:    89 years       BP:           114/71 mmHg Patient Gender: M              HR:           98 bpm. Exam Location:  [facility] Procedure: 2D Echo, Cardiac Doppler and Color Doppler Indications:     Shock  History:         Patient has no prior history of Echocardiogram examinations.                  Stroke and COPD; Signs/Symptoms:Shortness of Breath.  Sonographer:     Sherrie Sport RDCS (AE) Referring Phys:  [id number] Bradly Bienenstock Diagnosing Phys: Bartholome Bill MD  Sonographer Comments: Echo performed with patient supine and on artificial respirator, no apical window and no subcostal window. IMPRESSIONS  1. Left ventricular ejection fraction, by estimation, is >75%. The left ventricle has hyperdynamic function. The left ventricle has no regional wall motion abnormalities. Left ventricular diastolic parameters are consistent with Grade I diastolic dysfunction (impaired relaxation).  2. Right ventricular systolic function is normal. The right ventricular size is mildly enlarged.  3. The mitral valve is grossly normal. Trivial mitral valve regurgitation.  4. The aortic valve is grossly normal. Aortic valve  regurgitation is trivial. FINDINGS  Left Ventricle: Left ventricular ejection fraction, by estimation, is >75%. The left ventricle has hyperdynamic function. The left ventricle has no regional wall motion abnormalities. The left ventricular internal cavity size was normal in size. There is borderline left ventricular hypertrophy. Left ventricular diastolic parameters are consistent with Grade I diastolic dysfunction (impaired relaxation). Right Ventricle: The right ventricular size is mildly enlarged. No increase in right ventricular wall thickness. Right ventricular systolic function is normal. Left Atrium: Left atrial size was normal in  size. Right Atrium: Right atrial size was normal in size. Pericardium: There is no evidence of pericardial effusion. Mitral Valve: The mitral valve is grossly normal. Trivial mitral valve regurgitation. Tricuspid Valve: The tricuspid valve is not well visualized. Tricuspid valve regurgitation is trivial. Aortic Valve: The aortic valve is grossly normal. Aortic valve regurgitation is trivial. Pulmonic Valve: The pulmonic valve was not well visualized. Pulmonic valve regurgitation is not visualized. Aorta: The aortic root is normal in size and structure. IAS/Shunts: The interatrial septum was not assessed.  LEFT VENTRICLE PLAX 2D LVIDd:         3.95 cm LVIDs:         2.13 cm LV PW:         1.36 cm LV IVS:        1.09 cm LVOT diam:     2.00 cm LVOT Area:     3.14 cm  LEFT ATRIUM         Index LA diam:    3.20 cm 1.53 cm/m                        PULMONIC VALVE AORTA                 PV Vmax:        0.53 m/s Ao Root diam: 3.10 cm PV Peak grad:   1.1 mmHg                       RVOT Peak grad: 4 mmHg   SHUNTS Systemic Diam: 2.00 cm Bartholome Bill MD Electronically signed by Bartholome Bill MD Signature Date/Time: 04/01/2020/3:03:06 PM    Final      I have reviewed the above imaging : CT head shows no acute findings   ASSESSMENT AND PLAN   74 year old male presenting with altered  mental status and hypotension secondary to severe sepsis in the setting of suspected meningitis.  Neurology consulted for further recommendations.  Septic shock secondary to  meningitis Acute metabolic encephalopathy Hypoxic respite failure Suspected PE/DVT Hyperglycemia  Recommendations Agree with broad-spectrum antibiotic coverage Unfortunate unable to do LP due to suspected PE requiring anticoagulation Will continue to treat with antibiotics, when patient stable may consider temporarily stopping heparin drip to perform LP-would need to be done under fluoroscopy MRI brain without contrast when patient is stable Continue treat metabolic disturbances, renal failure, respiratory failure and infection per primary Consider infectious disease consult    Christabell Loseke [facility] Pager Number [phone]

## 2020-04-01 NOTE — Progress Notes (Signed)
Notified Critical care NP of bloodpressure dropping, received order to increase vasopressin and for neosynephrine

## 2020-04-01 NOTE — Consult Note (Signed)
ANTICOAGULATION CONSULT NOTE  Pharmacy Consult for Heparin Infusion Indication: High suspicion for PE  No Known Allergies  Patient Measurements: Height: 5\' 7"  (170.2 cm) Weight: 97.5 kg (215 lb) IBW/kg (Calculated) : 66.1 Heparin Dosing Weight: 87.1 kg  Vital Signs: Temp: 97 F (36.1 C) (08/31 1130) Temp Source: Axillary (08/31 1130) BP: 114/71 (08/31 1145) Pulse Rate: 98 (08/31 1130)  Labs: Recent Labs    03/14/2020 1519 03/04/2020 1718 03/17/2020 2204 04/01/20 0850  HGB 15.8  --  14.2  --   HCT 50.1  --  45.9  --   PLT 213  --  189  --   APTT 33  --   --   --   LABPROT 17.7*  --   --   --   INR 1.5*  --   --   --   HEPARINUNFRC  --   --   --  1.80*  CREATININE 3.25*  --  2.75*  --   TROPONINIHS 137* 136*  --   --     Estimated Creatinine Clearance: 26.2 mL/min (A) (by C-G formula based on SCr of 2.75 mg/dL (H)).   Medical History: Past Medical History:  Diagnosis Date  . AAA (abdominal aortic aneurysm) without rupture ([place])   . AAA (abdominal aortic aneurysm) without rupture ([place])   . Cancer [facility])    prostate cancer  . Chest pain   . COPD (chronic obstructive pulmonary disease) ([place])   . Depression   . Dizziness and giddiness   . Dvt femoral (deep venous thrombosis) ([facility])    H/O LEG  . GERD (gastroesophageal reflux disease)   . Heart disease   . Hyperlipidemia   . Hypertension   . Parkinson disease ([place])   . Prostate cancer ([place])   . Shortness of breath dyspnea    DOE  . Shoulder fracture, right   . Stroke [facility])    tia    Medications:  No anticoagulation prior to admission per chart review  Assessment: Patient is a 74 y/o M with medical history including COPD, stroke, DVT, prostate cancer, AAA who presented to the ED 8/30 from [facility] after being found unresponsive. Pharmacy has been consulted to initiate heparin. Venous US reveals occlusive thrombus within the right common femoral, femoral, and popliteal veins. H&H, platelets wnl at  baseline  Baseline H&H within normal limits. Baseline aPTT within normal limits. Baseline INR elevated to 1.5.  Goal of Therapy:  Heparin level 0.3-0.7 units/ml Monitor platelets by anticoagulation protocol: Yes   Plan:   Anti-Xa level is supra-therapeutic:  Hold heparin for one hour  Decrease infusion rate to 1200 units/hr  Re-check anti-Xa level 8 hours after restarting heparin infusion  Daily CBC while on heparin infusion per protocol  Dallie Piles 04/01/2020,12:39 PM

## 2020-04-01 NOTE — ED Notes (Signed)
ICU provider gave okay to use central line. Acyclovir restarted at prescribed rate to central line to finish.

## 2020-04-01 NOTE — Care Plan (Signed)
I spoke to Tylene Fantasia POA to explain that patient is struggling to breathe and is being treated for septic shock. They would like to treat patient maximally but do understand that chest compressions may be futile since he is on levophed gtt. He is with labored breathing and was full code on arrival. They do want him to have trial of ventilator to relieve labored breathing and allow patient to rest while infection is being treated.    Ottie Glazier, M.D.  Pulmonary & [place]

## 2020-04-01 NOTE — Consult Note (Signed)
I have placed a request via Secure Chat to Dr. Lanney Gins requesting photos of the wound or areas of concern to be placed in the EMR.    [place], [place], [place]

## 2020-04-01 NOTE — Progress Notes (Signed)
Able to contact Howard Anderson, pt's sister-in-law [facility] # [phone], [place] # [phone]).  She reports that she is only remaining family, as all pt's siblings are deceased, and no children.  She reports that pt has no advance directives to her knowledge.    Updated her on [name] critical illness with multiorgan failure, with guarded prognosis.  She understands he is critically ill, with likely poor outcome.    She consents to making Howard Anderson DNR/DNI status.  Palliative Care is consulted to further address goals of care.     Darel Hong, AGACNP-BC [place] [facility] Pager: [phone]

## 2020-04-01 NOTE — Consult Note (Signed)
[facility] Vascular Consult Note  MRN : [id number]  Howard Anderson is a 74 y.o. (10/21/1945) male who presents with chief complaint of  Chief Complaint  Patient presents with  . Altered Mental Status   History of Present Illness:  The patient is a 74 year old male multiple medical issues well-known to our services we have treated him for abdominal aortic aneurysm status post stent repair as well as venous insufficiency who presented to the [facility] from [place] with altered mental status.  Patient admitted with severe sepsis, shock and acute renal failure. Patient intubated and admitted to ICU and started on pressors. Patient altered and noted to have nuchal rigidity as well as severe sacral unstageable decubitus and therefore patient started on broad-spectrum antibiotics Vanco and Rocephin as well as acyclovir and ampicillin for empiric meningitis coverage.  Venous duplex on 04/01/20: 1. Occlusive thrombus within the right common femoral, femoral, and popliteal veins. Occlusive thrombus within the right posterior tibial and peroneal veins. 2. No deep venous thrombosis on the left. 3. Right Baker's cyst measuring up to 3.7 cm  Vascular surgery was consulted by NP Dewaine Conger for DVT and possibility of PE in the setting of respiratory failure.  Current Facility-Administered Medications  Medication Dose Route Frequency Provider Last Rate Last Admin  . 0.45 % sodium chloride infusion   Intravenous Continuous Bradly Bienenstock, NP   Stopped at 04/01/20 1556  . acyclovir (ZOVIRAX) 750 mg in dextrose 5 % 150 mL IVPB  750 mg Intravenous Q24H Benita Gutter, [facility]   Stopped at 04/01/20 0118  . ampicillin (OMNIPEN) 2 g in sodium chloride 0.9 % 100 mL IVPB  2 g Intravenous Q8H Bradly Bienenstock, NP   Stopped at 04/01/20 1533  . cefTRIAXone (ROCEPHIN) 2 g in sodium chloride 0.9 % 100 mL IVPB  2 g Intravenous Q12H Bradly Bienenstock, NP   Stopped  at 04/01/20 [phone]  . chlorhexidine gluconate (MEDLINE KIT) (PERIDEX) 0.12 % solution 15 mL  15 mL Mouth Rinse BID Ottie Glazier, MD   15 mL at 04/01/20 1133  . Chlorhexidine Gluconate Cloth 2 % PADS 6 each  6 each Topical Daily Ottie Glazier, MD   6 each at 04/01/20 1126  . dexamethasone (DECADRON) injection 12 mg  12 mg Intravenous Q6H Darel Hong D, NP   12 mg at 04/01/20 1418  . docusate sodium (COLACE) capsule 100 mg  100 mg Oral BID PRN Bradly Bienenstock, NP      . fentaNYL (SUBLIMAZE) injection   Intravenous PRN Ottie Glazier, MD   100 mcg at 04/01/20 1016  . fentaNYL 2575mg in NS 2567m(10110mml) infusion-PREMIX  0-400 mcg/hr Intravenous Continuous AleOttie GlazierD   Stopped at 04/01/20 1604  . heparin ADULT infusion 100 units/mL (25000 units/250m60mdium chloride 0.45%)  1,200 Units/hr Intravenous Continuous GrubDallie PilesH   Paused at 04/01/20 1600  . insulin aspart (novoLOG) injection 0-15 Units  0-15 Units Subcutaneous Q4H GrubDallie PilesH      . MEDLINE mouth rinse  15 mL Mouth Rinse 10 times per day AlesOttie Glazier   15 mL at 04/01/20 1420  . midazolam (VERSED) 5 MG/5ML injection   Intravenous PRN AlesOttie Glazier   2 mg at 04/01/20 1016  . norepinephrine (LEVOPHED) 16 mg in 250mL64mmix infusion  0-40 mcg/min Intravenous Titrated KeeneBradly Bienenstock  Stopped at 04/01/20 1552  . polyethylene glycol (MIRALAX / GLYCOLAX) packet  17 g  17 g Oral Daily PRN Darel Hong D, NP      . rocuronium Alfredia Client) injection   Intravenous PRN Ottie Glazier, MD   40 mg at 04/01/20 1017  . vancomycin variable dose per unstable renal function (pharmacist dosing)   Does not apply See admin instructions Benita Gutter, [facility]      . vasopressin (PITRESSIN) 20 Units in sodium chloride 0.9 % 100 mL infusion-*FOR SHOCK*  0-0.03 Units/min Intravenous Continuous Bradly Bienenstock, NP   Stopped at 04/01/20 1555   Past Medical History:  Diagnosis Date  . AAA (abdominal aortic  aneurysm) without rupture ([place])   . AAA (abdominal aortic aneurysm) without rupture ([place])   . Cancer [facility])    prostate cancer  . Chest pain   . COPD (chronic obstructive pulmonary disease) ([place])   . Depression   . Dizziness and giddiness   . Dvt femoral (deep venous thrombosis) ([facility])    H/O LEG  . GERD (gastroesophageal reflux disease)   . Heart disease   . Hyperlipidemia   . Hypertension   . Parkinson disease ([place])   . Prostate cancer ([place])   . Shortness of breath dyspnea    DOE  . Shoulder fracture, right   . Stroke [facility])    tia   Past Surgical History:  Procedure Laterality Date  . CATARACT EXTRACTION W/PHACO Left 09/27/2018   Procedure: CATARACT EXTRACTION PHACO AND INTRAOCULAR LENS PLACEMENT ([place]) LEFT;  Surgeon: Leandrew Koyanagi, MD;  Location: [facility] ORS;  Service: Ophthalmology;  Laterality: Left;  [place]  00:42 AP% 15.1 CDE 5.16 Fluid pack lot # [id number]  . CATARACT EXTRACTION W/PHACO Right 02/28/2019   Procedure: CATARACT EXTRACTION PHACO AND INTRAOCULAR LENS PLACEMENT ([place]) RIGHT MALYUGIN;  Surgeon: Leandrew Koyanagi, MD;  Location: [place];  Service: Ophthalmology;  Laterality: Right;  . COLONOSCOPY  2010  . EMBOLECTOMY Left 06/20/2015   Procedure: EMBOLECTOMY;  Surgeon: [name], MD;  Location: [facility] ORS;  Service: Vascular;  Laterality: Left;  brachial thrombectomy  . ENDARTERECTOMY FEMORAL Left 06/20/2015   Procedure: ENDARTERECTOMY FEMORAL/FEMORAL PSEUDOANEURYSM repair;  Surgeon: [name], MD;  Location: [facility] ORS;  Service: Vascular;  Laterality: Left;  femerol  . FRACTURE SURGERY Right    SHOULDER  . INNER EAR SURGERY    . MANDIBLE SURGERY     TUMOR  . PERIPHERAL VASCULAR CATHETERIZATION N/A 05/21/2015   Procedure: Endovascular Repair/Stent Graft;  Surgeon: [name], MD;  Location: [place] [facility];  Service: Cardiovascular;  Laterality: N/A;  . PERIPHERAL VASCULAR CATHETERIZATION  06/20/2015   Procedure: Lower  Extremity Angiography;  Surgeon: [name], MD;  Location: [place] [facility];  Service: Cardiovascular;;  . PERIPHERAL VASCULAR CATHETERIZATION  06/20/2015   Procedure: Lower Extremity Intervention;  Surgeon: [name], MD;  Location: [place] [facility];  Service: Cardiovascular;;   Social History Social History   Tobacco Use  . Smoking status: Former Smoker    Packs/day: 1.50    Years: 48.00    Pack years: 72.00    Quit date: 10/10/2013    Years since quitting: 6.4  . Smokeless tobacco: Never Used  Vaping Use  . Vaping Use: Never used  Substance Use Topics  . Alcohol use: No  . Drug use: No   Family History Family History  Problem Relation Age of Onset  . Prostate cancer Brother   . Bladder Cancer Neg Hx   . Kidney cancer Neg Hx   Unable  to ascertain due to patient's altered mental status - intubated and sedated.  No Known Allergies  REVIEW OF SYSTEMS (Negative unless checked)  Constitutional: _0 Weight loss  _1 Fever  _2 Chills Cardiac: _3 Chest pain   _4 Chest pressure   _5 Palpitations   _6 Shortness of breath when laying flat   _7 Shortness of breath at rest   _8 Shortness of breath with exertion. Vascular:  _9 Pain in legs with walking   _10 Pain in legs at rest   _11 Pain in legs when laying flat   _12 Claudication   _13 Pain in feet when walking  _14 Pain in feet at rest  _15 Pain in feet when laying flat   _16 History of DVT   _17 Phlebitis   _18 Swelling in legs   _19 Varicose veins   _20 Non-healing ulcers Pulmonary:   _21 Uses home oxygen   _22 Productive cough   _23 Hemoptysis   _24 Wheeze  _25 COPD   _26 Asthma Neurologic:  _27 Dizziness  _28 Blackouts   _29 Seizures   _30 History of stroke   _31 History of TIA  _32 Aphasia   _33 Temporary blindness   _34 Dysphagia   _35 Weakness or numbness in arms   _36 Weakness or numbness in legs Musculoskeletal:  _37 Arthritis   _38 Joint swelling   _39 Joint pain   _40 Low back pain Hematologic:  _41 Easy bruising  _42 Easy bleeding   _43 Hypercoagulable state   _44 Anemic   _45 Hepatitis Gastrointestinal:  _46 Blood in stool   _47 Vomiting blood  _48 Gastroesophageal reflux/heartburn   _49 Difficulty swallowing. Genitourinary:  _50 Chronic kidney disease   _51 Difficult urination  _52 Frequent urination  _53 Burning with urination   _54 Blood in urine Skin:  _55 Rashes   _56 Ulcers   _57 Wounds Psychological:  _58 History of anxiety   _59  History of major depression.  Unable to ascertain due to the patient's altered mental status, intubated and sedated.  Physical Examination  Vitals:   04/01/20 1545 04/01/20 1600 04/01/20 1615 04/01/20 1718  BP: 101/69 (!) 98/53 (!) 106/91 (!) 94/56  Pulse: 90 90 (!) 110 99  Resp: 15 15 (!) 25   Temp: (!) 96.6 F (35.9 C) (!) 96.8 F (36 C)    TempSrc:      SpO2: 92% 98% 99% 94%  Weight:      Height:       Body mass index is 33.67 kg/m. Gen: Intubated and sedated, NAD Head: [place]/AT, No temporalis wasting. Prominent temp pulse not noted. Ear/Nose/Throat: Hearing grossly intact, nares w/o erythema or drainage, oropharynx w/o Erythema/Exudate Eyes: Sclera non-icteric, conjunctiva clear Neck: Trachea midline.  No JVD.  Pulmonary: Intubated. Good air movement, respirations not labored, equal bilaterally.  Cardiac: RRR, normal S1, S2. Vascular: Vessel Right Left  Radial Palpable Palpable  Ulnar Palpable Palpable  Brachial Palpable Palpable  Carotid Palpable, without bruit Palpable, without bruit  Aorta Not palpable N/A  Femoral Palpable Palpable  Popliteal Palpable Palpable  PT Non-Palpable Non-Palpable  DP Non-Palpable Non-Palpable   Right lower extremity: Thigh soft.  Calf soft.  Extremities warm distally toes.  No acute vascular compromise noted at this time.  Possible pressure ulcers noted around ankle and to toes.  Left lower extremity: Soft.  Calf soft.  Extremity warm distally toes.  No acute vascular, rest extremity at this time.  Ulcers noted to toes.  Gastrointestinal: soft, non-tender/non-distended. No guarding/reflex.   Musculoskeletal: Moderate edema billaterally . Neurologic: Unable to gather due to altered mental status, sedated and intubated. Psychiatric: Unable to gather due to altered mental status, sedated and intubated. Dermatologic: Severe unstageable decubitus ulcer.  Please see picture in patient's chart.  Ulcerations to the right ankle and toes.  Ulcers  to left foot toes. Lymph : No Cervical, Axillary, or Inguinal lymphadenopathy.  CBC Lab Results  Component Value Date   WBC 18.6 (H) 03/30/2020   HGB 14.2 03/17/2020   HCT 45.9 03/30/2020   MCV 97.7 03/13/2020   PLT 189 03/02/2020   BMET    Component Value Date/Time   NA 154 (H) 03/22/2020 2204   NA 140 02/11/2020 1348   NA 139 08/02/2014 1128   K 5.0 03/23/2020 2204   K 4.2 08/02/2014 1128   CL 118 (H) 03/25/2020 2204   CL 106 08/02/2014 1128   CO2 24 03/23/2020 2204   CO2 25 08/02/2014 1128   GLUCOSE 206 (H) 03/30/2020 2204   GLUCOSE 175 (H) 08/02/2014 1128   BUN 88 (H) 03/30/2020 2204   BUN 15 02/11/2020 1348   BUN 16 08/02/2014 1128   CREATININE 2.75 (H) 03/21/2020 2204   CREATININE 0.74 08/03/2014 0830   CALCIUM 6.9 (L) 03/21/2020 2204   CALCIUM 7.9 (L) 08/02/2014 1128   GFRNONAA 22 (L) 03/06/2020 2204   GFRNONAA >60 08/03/2014 0830   GFRNONAA >60 10/18/2013 0502   GFRAA 25 (L) 03/27/2020 2204   GFRAA >60 08/03/2014 0830   GFRAA >60 10/18/2013 0502   Estimated Creatinine Clearance: 26.2 mL/min (A) (by C-G formula based on SCr of 2.75 mg/dL (H)).  COAG Lab Results  Component Value Date   INR 1.5 (H) 04/01/2020   INR 0.98 05/08/2015   Radiology DG Chest 1 View  Result Date: 04/01/2020 CLINICAL DATA:  Right internal jugular central venous catheter placement, history of dyspnea EXAM: CHEST  1 VIEW COMPARISON:  03/17/2020 FINDINGS: Two frontal views of the chest demonstrate right internal jugular catheter tip overlying superior vena cava. The cardiac silhouette is unchanged. Patchy bibasilar consolidation and likely  left pleural effusion unchanged since previous exam. No evidence of pneumothorax on this supine projection. No acute bony abnormalities. IMPRESSION: 1. Right internal jugular catheter overlying superior vena cava. 2. Persistent bibasilar consolidation and left effusion. 3. No evidence of pneumothorax on this supine exam. Electronically Signed   By: Randa Ngo M.D.   On: 04/01/2020 00:48   CT Head Wo Contrast  Result Date: 04/01/2020 CLINICAL DATA:  Head trauma unresponsive bradycardia EXAM: CT HEAD WITHOUT CONTRAST CT CERVICAL SPINE WITHOUT CONTRAST TECHNIQUE: Multidetector CT imaging of the head and cervical spine was performed following the standard protocol without intravenous contrast. Multiplanar CT image reconstructions of the cervical spine were also generated. COMPARISON:  CT 01/01/2016 FINDINGS: CT HEAD FINDINGS Brain: No acute territorial infarction, hemorrhage, or intracranial mass. Moderate atrophy. Moderate hypodensity in the white matter consistent with chronic small vessel ischemic change. Stable ventricle size. Vascular: No hyperdense vessels.  Carotid vascular calcification Skull: Normal. Negative for fracture or focal lesion. Sinuses/Orbits: No acute finding. Other: None CT CERVICAL SPINE FINDINGS Alignment: Straightening of the cervical spine. No subluxation. Facet alignment is maintained. Skull base and vertebrae: No acute fracture. No primary bone lesion or focal pathologic process. Soft tissues and spinal canal: No prevertebral fluid or swelling. No visible canal hematoma. Disc levels: Diffuse bulky osteophytosis throughout the cervical spine. Moderate disc space narrowing C3-C4 with mild diffuse disc space narrowing C4 through C7. Facet degenerative changes at multiple levels. Moderate canal stenosis at C3-C4. Foraminal stenosis at multiple levels. Upper chest: Negative. Other: None IMPRESSION: 1. No CT evidence for acute intracranial abnormality. Atrophy and chronic small vessel  ischemic changes of the white matter. 2. Straightening of the cervical spine with degenerative changes. No acute  osseous abnormality. Electronically Signed   By: Donavan Foil M.D.   On: 03/16/2020 19:24   CT Cervical Spine Wo Contrast  Result Date: 03/26/2020 CLINICAL DATA:  Head trauma unresponsive bradycardia EXAM: CT HEAD WITHOUT CONTRAST CT CERVICAL SPINE WITHOUT CONTRAST TECHNIQUE: Multidetector CT imaging of the head and cervical spine was performed following the standard protocol without intravenous contrast. Multiplanar CT image reconstructions of the cervical spine were also generated. COMPARISON:  CT 01/01/2016 FINDINGS: CT HEAD FINDINGS Brain: No acute territorial infarction, hemorrhage, or intracranial mass. Moderate atrophy. Moderate hypodensity in the white matter consistent with chronic small vessel ischemic change. Stable ventricle size. Vascular: No hyperdense vessels.  Carotid vascular calcification Skull: Normal. Negative for fracture or focal lesion. Sinuses/Orbits: No acute finding. Other: None CT CERVICAL SPINE FINDINGS Alignment: Straightening of the cervical spine. No subluxation. Facet alignment is maintained. Skull base and vertebrae: No acute fracture. No primary bone lesion or focal pathologic process. Soft tissues and spinal canal: No prevertebral fluid or swelling. No visible canal hematoma. Disc levels: Diffuse bulky osteophytosis throughout the cervical spine. Moderate disc space narrowing C3-C4 with mild diffuse disc space narrowing C4 through C7. Facet degenerative changes at multiple levels. Moderate canal stenosis at C3-C4. Foraminal stenosis at multiple levels. Upper chest: Negative. Other: None IMPRESSION: 1. No CT evidence for acute intracranial abnormality. Atrophy and chronic small vessel ischemic changes of the white matter. 2. Straightening of the cervical spine with degenerative changes. No acute osseous abnormality. Electronically Signed   By: Donavan Foil M.D.    On: 03/11/2020 19:24   US Venous Img Lower Bilateral (DVT)  Result Date: 04/01/2020 CLINICAL DATA:  Elevated D-dimer EXAM: BILATERAL LOWER EXTREMITY VENOUS DOPPLER ULTRASOUND TECHNIQUE: Gray-scale sonography with graded compression, as well as color Doppler and duplex ultrasound were performed to evaluate the lower extremity deep venous systems from the level of the common femoral vein and including the common femoral, femoral, profunda femoral, popliteal and calf veins including the posterior tibial, peroneal and gastrocnemius veins when visible. The superficial great saphenous vein was also interrogated. Spectral Doppler was utilized to evaluate flow at rest and with distal augmentation maneuvers in the common femoral, femoral and popliteal veins. COMPARISON:  None. FINDINGS: RIGHT LOWER EXTREMITY Common Femoral Vein: Noncompressible occlusive thrombosis Saphenofemoral Junction: No evidence of thrombus. Normal compressibility and flow on color Doppler imaging. Profunda Femoral Vein: No evidence of thrombus. Normal compressibility and flow on color Doppler imaging. Femoral Vein: Occlusive thrombus, noncompressible Popliteal Vein: Noncompressible occlusive thrombus Calf Veins: There is occlusive thrombus within the posterior tibial and peroneal veins. Superficial Great Saphenous Vein: No evidence of thrombus. Normal compressibility. Venous Reflux:  None. Other Findings: Fluid collection in the right popliteal fossa measures 3.7 x 1.5 cm LEFT LOWER EXTREMITY Common Femoral Vein: No evidence of thrombus. Normal compressibility, respiratory phasicity and response to augmentation. Saphenofemoral Junction: No evidence of thrombus. Normal compressibility and flow on color Doppler imaging. Profunda Femoral Vein: No evidence of thrombus. Normal compressibility and flow on color Doppler imaging. Femoral Vein: No evidence of thrombus. Normal compressibility, respiratory phasicity and response to augmentation. Popliteal  Vein: No evidence of thrombus. Normal compressibility, respiratory phasicity and response to augmentation. Calf Veins: No evidence of thrombus. Normal compressibility and flow on color Doppler imaging. Superficial Great Saphenous Vein: No evidence of thrombus. Normal compressibility. Venous Reflux:  None. Other Findings:  None. IMPRESSION: 1. Occlusive thrombus within the right common femoral, femoral, and popliteal veins. Occlusive thrombus within the right posterior tibial and peroneal veins.  2. No deep venous thrombosis on the left. 3. Right Baker's cyst measuring up to 3.7 cm. Critical Value/emergent results were called by telephone at the time of interpretation on 04/01/2020 at 2:49 am to provider [facility] , who verbally acknowledged these results. Electronically Signed   By: Ulyses Jarred M.D.   On: 04/01/2020 02:50   DG Chest Port 1 View  Result Date: 04/01/2020 CLINICAL DATA:  Intubation, orogastric tube placement EXAM: PORTABLE CHEST 1 VIEW COMPARISON:  Portable exam 1102 hours compared to 04/01/2020 FINDINGS: Nasogastric tube extends into stomach. Tip of endotracheal tube projects 3.5 cm above carina. RIGHT jugular line tip projecting over SVC. Normal heart size and mediastinal contours. Atherosclerotic calcification aorta. Atelectasis versus consolidation at LEFT base. Subsegmental atelectasis RIGHT base. Upper lungs clear. Small LEFT pleural effusion questioned. No pneumothorax. IMPRESSION: RIGHT basilar atelectasis with atelectasis versus consolidation and suspected small LEFT pleural effusion at LEFT lung base. Electronically Signed   By: Lavonia Dana M.D.   On: 04/01/2020 11:27   DG Chest Portable 1 View  Result Date: 03/22/2020 CLINICAL DATA:  Dyspnea EXAM: PORTABLE CHEST 1 VIEW COMPARISON:  03/13/2020 FINDINGS: Mild bibasilar atelectasis has developed. Small left pleural effusion has developed. No pneumothorax. Cardiac size is within normal limits, unchanged from prior examination.  Multiple healed right rib fractures are noted. No acute bone abnormality. IMPRESSION: Mild bibasilar atelectasis and small left pleural effusion, new since prior examination. Electronically Signed   By: Fidela Salisbury MD   On: 03/17/2020 15:54   DG Chest Portable 1 View  Result Date: 03/13/2020 CLINICAL DATA:  Generalized weakness EXAM: PORTABLE CHEST 1 VIEW COMPARISON:  02/15/2020 FINDINGS: Lungs are well expanded, symmetric, and clear. No pneumothorax or pleural effusion. Cardiac size within normal limits. Pulmonary vascularity is normal. Osseous structures are age-appropriate. Multiple healed right rib fractures are identified. Degenerative changes are noted within the right shoulder. No acute bone abnormality. IMPRESSION: No active disease. Electronically Signed   By: Fidela Salisbury MD   On: 03/13/2020 22:34   ECHOCARDIOGRAM COMPLETE  Result Date: 04/01/2020    ECHOCARDIOGRAM REPORT   Patient Name:   KEANE MARTELLI Date of Exam: 04/01/2020 Medical Rec #:  [id number]      Height:       67.0 in Accession #:    [id number]     Weight:       215.0 lb Date of Birth:  1945-12-13      BSA:          2.085 m Patient Age:    66 years       BP:           114/71 mmHg Patient Gender: M              HR:           98 bpm. Exam Location:  [facility] Procedure: 2D Echo, Cardiac Doppler and Color Doppler Indications:     Shock  History:         Patient has no prior history of Echocardiogram examinations.                  Stroke and COPD; Signs/Symptoms:Shortness of Breath.  Sonographer:     Sherrie Sport RDCS (AE) Referring Phys:  [id number] Bradly Bienenstock Diagnosing Phys: Bartholome Bill MD  Sonographer Comments: Echo performed with patient supine and on artificial respirator, no apical window and no subcostal window. IMPRESSIONS  1. Left ventricular ejection fraction, by estimation, is >75%. The left  ventricle has hyperdynamic function. The left ventricle has no regional wall motion abnormalities. Left ventricular diastolic parameters  are consistent with Grade I diastolic dysfunction (impaired relaxation).  2. Right ventricular systolic function is normal. The right ventricular size is mildly enlarged.  3. The mitral valve is grossly normal. Trivial mitral valve regurgitation.  4. The aortic valve is grossly normal. Aortic valve regurgitation is trivial. FINDINGS  Left Ventricle: Left ventricular ejection fraction, by estimation, is >75%. The left ventricle has hyperdynamic function. The left ventricle has no regional wall motion abnormalities. The left ventricular internal cavity size was normal in size. There is borderline left ventricular hypertrophy. Left ventricular diastolic parameters are consistent with Grade I diastolic dysfunction (impaired relaxation). Right Ventricle: The right ventricular size is mildly enlarged. No increase in right ventricular wall thickness. Right ventricular systolic function is normal. Left Atrium: Left atrial size was normal in size. Right Atrium: Right atrial size was normal in size. Pericardium: There is no evidence of pericardial effusion. Mitral Valve: The mitral valve is grossly normal. Trivial mitral valve regurgitation. Tricuspid Valve: The tricuspid valve is not well visualized. Tricuspid valve regurgitation is trivial. Aortic Valve: The aortic valve is grossly normal. Aortic valve regurgitation is trivial. Pulmonic Valve: The pulmonic valve was not well visualized. Pulmonic valve regurgitation is not visualized. Aorta: The aortic root is normal in size and structure. IAS/Shunts: The interatrial septum was not assessed.  LEFT VENTRICLE PLAX 2D LVIDd:         3.95 cm LVIDs:         2.13 cm LV PW:         1.36 cm LV IVS:        1.09 cm LVOT diam:     2.00 cm LVOT Area:     3.14 cm  LEFT ATRIUM         Index LA diam:    3.20 cm 1.53 cm/m                        PULMONIC VALVE AORTA                 PV Vmax:        0.53 m/s Ao Root diam: 3.10 cm PV Peak grad:   1.1 mmHg                       RVOT Peak grad:  4 mmHg   SHUNTS Systemic Diam: 2.00 cm Bartholome Bill MD Electronically signed by Bartholome Bill MD Signature Date/Time: 04/01/2020/3:03:06 PM    Final    Assessment/Plan The patient is a 74 year old male multiple medical issues well-known to our services we have treated him for abdominal aortic aneurysm status post stent repair as well as venous insufficiency who presented to the [facility] from [place] with altered mental status. Patient admitted with severe sepsis, shock and acute renal failure. Patient intubated and admitted to ICU and started on pressors.   1. DVT / possible PE: Extensive DVT noted to the right lower extremity.  Heparin initiated.  On physical exam, there is no acute vascular compromise noted to the right extremity at this time.  Due to the patient's critical status would not move forward with any type of venous intervention.  Patient presented with significant hemodynamic instability due to sepsis. In the setting of extensive DVT can certainly also have acute PE.  Unable to conduct CTA due to renal function.  Due to the critical nature the patient is not a candidate for VQ scan.  If the patient should become more stable could plan on pulmonary angiography.   2.  Sepsis: Patient found to have severe unstageable sacral decubitus.   ?  Meningitis On broad-spectrum antibiotics.  3.  Acute renal failure: Baseline creatinine of 0.79, normal GFR 03/13/20 Currently on IV fluids Nephrology following  Will continue to follow Discussed with Dr. Francene Castle, PA-C  04/01/2020 6:02 PM  This note was created with Dragon medical transcription system.  Any error is purely unintentional

## 2020-04-01 NOTE — Consult Note (Signed)
[facility]  CONSULT NOTE    Date: 04/01/2020                  Patient Name:  Howard Anderson  MRN: [id number]  DOB: 12-03-1945  Age / Sex: 74 y.o., male         PCP: Maryella Shivers, MD                 Service Requesting Consult: Dr. Lanney Gins                 Reason for Consult: Acute renal failure            History of Present Illness: Howard Anderson  Was found at [facility] with altered mental status. There is clinical suspicion for meningitis. Also found to have a DVT. Nephrology consulted for acute renal failure. Patient moved to ICU and placed on norepinephrine and vasopressin. Intubated and requiring mechanical ventilation.    Medications: Outpatient medications: Medications Prior to Admission  Medication Sig Dispense Refill Last Dose  . aspirin 81 MG tablet Take 1 tablet (81 mg total) by mouth daily. 30 tablet 11   . atorvastatin (LIPITOR) 10 MG tablet TAKE 1 TABLET BY MOUTH DAILY (Patient taking differently: Take 10 mg by mouth every evening. ) 90 tablet 1   . brimonidine-timolol (COMBIGAN) 0.2-0.5 % ophthalmic solution Place 1 drop into both eyes every 12 (twelve) hours.     . carbidopa-levodopa (SINEMET IR) 25-100 MG tablet Take 1.5 tablets by mouth 3 (three) times daily. Dr Manuella Ghazi     . Cholecalciferol (VITAMIN D3) 1000 UNITS CAPS Take 1,000 Units by mouth daily.      . dorzolamide (TRUSOPT) 2 % ophthalmic solution Place 1 drop into both eyes 2 (two) times daily.   3   . doxycycline (PERIOSTAT) 20 MG tablet Take 20 mg by mouth 2 (two) times daily. dentist     . famotidine (PEPCID) 40 MG tablet Take 1 tablet (40 mg total) by mouth daily. 90 tablet 1   . latanoprost (XALATAN) 0.005 % ophthalmic solution Place 1 drop into both eyes at bedtime.      . Multiple Vitamins-Minerals (PRESERVISION AREDS PO) Take 1 capsule by mouth 2 (two) times daily.     . sertraline (ZOLOFT) 50 MG tablet Take 1.5 tablets (75 mg total) by mouth daily. 135  tablet 1   . tamsulosin (FLOMAX) 0.4 MG CAPS capsule TAKE 1 CAPSULE BY MOUTH ONCE DAILY (Patient taking differently: Take 0.4 mg by mouth daily after supper. ) 90 capsule 3   . traZODone (DESYREL) 50 MG tablet Take 1 tablet (50 mg total) by mouth at bedtime. (Patient taking differently: Take 50 mg by mouth at bedtime as needed for sleep. ) 90 tablet 1   . valsartan (DIOVAN) 80 MG tablet TAKE 1 TABLET (80 MG TOTAL) BY MOUTH ONCE DAILY. (Patient taking differently: Take 80 mg by mouth daily. ) 90 tablet 3   . vitamin B-12 (CYANOCOBALAMIN) 1000 MCG tablet Take 1,000 mcg by mouth daily.       Current medications: Current Facility-Administered Medications  Medication Dose Route Frequency Provider Last Rate Last Admin  . 0.45 % sodium chloride infusion   Intravenous Continuous Darel Hong D, NP 100 mL/hr at 04/01/20 1140 New Bag at 04/01/20 1140  . acyclovir (ZOVIRAX) 750 mg in dextrose 5 % 150 mL IVPB  750 mg Intravenous Q24H Benita Gutter, [facility]   Stopped at 04/01/20 0118  .  ampicillin (OMNIPEN) 2 g in sodium chloride 0.9 % 100 mL IVPB  2 g Intravenous Q8H Bradly Bienenstock, NP   Stopped at 04/01/20 [phone]  . cefTRIAXone (ROCEPHIN) 2 g in sodium chloride 0.9 % 100 mL IVPB  2 g Intravenous Q12H Bradly Bienenstock, NP   Stopped at 04/01/20 [phone]  . chlorhexidine gluconate (MEDLINE KIT) (PERIDEX) 0.12 % solution 15 mL  15 mL Mouth Rinse BID Ottie Glazier, MD   15 mL at 04/01/20 1133  . Chlorhexidine Gluconate Cloth 2 % PADS 6 each  6 each Topical Daily Ottie Glazier, MD   6 each at 04/01/20 1126  . dexamethasone (DECADRON) injection 12 mg  12 mg Intravenous Q6H Darel Hong D, NP   12 mg at 04/01/20 0758  . docusate sodium (COLACE) capsule 100 mg  100 mg Oral BID PRN Bradly Bienenstock, NP      . fentaNYL (SUBLIMAZE) injection   Intravenous PRN Ottie Glazier, MD   100 mcg at 04/01/20 1016  . fentaNYL 2568mg in NS 2543m(1037mml) infusion-PREMIX  0-400 mcg/hr Intravenous Continuous AleOttie GlazierD 2.5 mL/hr at 04/01/20 1029 25 mcg/hr at 04/01/20 1029  . heparin ADULT infusion 100 units/mL (25000 units/250m14mdium chloride 0.45%)  1,200 Units/hr Intravenous Continuous GrubDallie PilesH 14 mL/hr at 04/01/20 0100 1,400 Units/hr at 04/01/20 0100  . insulin aspart (novoLOG) injection 0-15 Units  0-15 Units Subcutaneous Q4H GrubDallie PilesH      . MEDLINE mouth rinse  15 mL Mouth Rinse 10 times per day AlesOttie Glazier   15 mL at 04/01/20 1146  . midazolam (VERSED) 5 MG/5ML injection   Intravenous PRN AlesOttie Glazier   2 mg at 04/01/20 1016  . norepinephrine (LEVOPHED) 16 mg in 250mL16mmix infusion  0-40 mcg/min Intravenous Titrated KeeneDarel HongP 13.13 mL/hr at 04/01/20 0519 14 mcg/min at 04/01/20 0519  . polyethylene glycol (MIRALAX / GLYCOLAX) packet 17 g  17 g Oral Daily PRN KeeneDarel HongP      . rocuronium (ZEMUAlfredia Clientection   Intravenous PRN AleskOttie Glazier  40 mg at 04/01/20 1017  . vancomycin variable dose per unstable renal function (pharmacist dosing)   Does not apply See admin instructions ChappBenita Gutter  [facility] . vasopressin (PITRESSIN) 20 Units in sodium chloride 0.9 % 100 mL infusion-*FOR SHOCK*  0-0.03 Units/min Intravenous Continuous KeeneDarel HongP 3 mL/hr at 04/01/20 0503 0.01 Units/min at 04/01/20 0503      Allergies: No Known Allergies    Past Medical History: Past Medical History:  Diagnosis Date  . AAA (abdominal aortic aneurysm) without rupture (HCC) [place] AAA (abdominal aortic aneurysm) without rupture (HCC) [place] [facility]  . Chest pain   . COPD (chronic obstructive pulmonary disease) (HCC) [place] Depression   . Dizziness and giddiness   . Dvt femoral (deep venous thrombosis) (HCC)    H/O LEG  . GERD (gastroesophageal reflux disease)   . Heart disease   . Hyperlipidemia   . Hypertension   . Parkinson disease (HCC) [place] Prostate cancer (HCC) [place] Shortness of breath dyspnea    DOE  .  Shoulder fracture, right   . Stroke (HCC)[facility]     Past Surgical History: Past Surgical History:  Procedure Laterality Date  . CATARACT EXTRACTION W/PHACO Left 09/27/2018   Procedure: CATARACT  EXTRACTION PHACO AND INTRAOCULAR LENS PLACEMENT ([place]) LEFT;  Surgeon: Leandrew Koyanagi, MD;  Location: [facility] ORS;  Service: Ophthalmology;  Laterality: Left;  [place]  00:42 AP% 15.1 CDE 5.16 Fluid pack lot # [id number]  . CATARACT EXTRACTION W/PHACO Right 02/28/2019   Procedure: CATARACT EXTRACTION PHACO AND INTRAOCULAR LENS PLACEMENT ([place]) RIGHT MALYUGIN;  Surgeon: Leandrew Koyanagi, MD;  Location: [place];  Service: Ophthalmology;  Laterality: Right;  . COLONOSCOPY  2010  . EMBOLECTOMY Left 06/20/2015   Procedure: EMBOLECTOMY;  Surgeon: [name], MD;  Location: [facility] ORS;  Service: Vascular;  Laterality: Left;  brachial thrombectomy  . ENDARTERECTOMY FEMORAL Left 06/20/2015   Procedure: ENDARTERECTOMY FEMORAL/FEMORAL PSEUDOANEURYSM repair;  Surgeon: [name], MD;  Location: [facility] ORS;  Service: Vascular;  Laterality: Left;  femerol  . FRACTURE SURGERY Right    SHOULDER  . INNER EAR SURGERY    . MANDIBLE SURGERY     TUMOR  . PERIPHERAL VASCULAR CATHETERIZATION N/A 05/21/2015   Procedure: Endovascular Repair/Stent Graft;  Surgeon: [name], MD;  Location: [place] CV LAB;  Service: Cardiovascular;  Laterality: N/A;  . PERIPHERAL VASCULAR CATHETERIZATION  06/20/2015   Procedure: Lower Extremity Angiography;  Surgeon: [name], MD;  Location: [place] CV LAB;  Service: Cardiovascular;;  . PERIPHERAL VASCULAR CATHETERIZATION  06/20/2015   Procedure: Lower Extremity Intervention;  Surgeon: [name], MD;  Location: [place] CV LAB;  Service: Cardiovascular;;     Family History: Family History  Problem Relation Age of Onset  . Prostate cancer Brother   . Bladder Cancer Neg Hx   . Kidney cancer Neg Hx      Social  History: Social History   Socioeconomic History  . Marital status: Widowed    Spouse name: Not on file  . Number of children: Not on file  . Years of education: Not on file  . Highest education level: Not on file  Occupational History  . Not on file  Tobacco Use  . Smoking status: Former Smoker    Packs/day: 1.50    Years: 48.00    Pack years: 72.00    Quit date: 10/10/2013    Years since quitting: 6.4  . Smokeless tobacco: Never Used  Vaping Use  . Vaping Use: Never used  Substance and Sexual Activity  . Alcohol use: No  . Drug use: No  . Sexual activity: Not on file  Other Topics Concern  . Not on file  Social History Narrative  . Not on file   Social Determinants of Health   Financial Resource Strain:   . Difficulty of Paying Living Expenses: Not on file  Food Insecurity:   . Worried About Charity fundraiser in the Last Year: Not on file  . Ran Out of Food in the Last Year: Not on file  Transportation Needs:   . Lack of Transportation (Medical): Not on file  . Lack of Transportation (Non-Medical): Not on file  Physical Activity:   . Days of Exercise per Week: Not on file  . Minutes of Exercise per Session: Not on file  Stress:   . Feeling of Stress : Not on file  Social Connections:   . Frequency of Communication with Friends and Family: Not on file  . Frequency of Social Gatherings with Friends and Family: Not on file  . Attends Religious Services: Not on file  . Active Member of Clubs or Organizations: Not on file  . Attends Archivist Meetings:  Not on file  . Marital Status: Not on file  Intimate Partner Violence:   . Fear of Current or Ex-Partner: Not on file  . Emotionally Abused: Not on file  . Physically Abused: Not on file  . Sexually Abused: Not on file     Review of Systems: Review of Systems  Unable to perform ROS: Intubated    Vital Signs: Blood pressure 114/71, pulse 98, temperature (!) 97 F (36.1 C), temperature source  Axillary, resp. rate 15, height '5\' 7"'  (1.702 m), weight 97.5 kg, SpO2 92 %.  Weight trends: Filed Weights   03/09/2020 1525  Weight: 97.5 kg    Physical Exam: General: Critically Ill  Head: ETT, OGT   Eyes: Anicteric, eyes closed  Neck:   trachea midline  Lungs:  PRVC FiO2 60%.   Heart: tachycardia  Abdomen:  Soft, nontender,   Extremities: no peripheral edema.  Neurologic: Intubated and sedate  Skin: No lesions          Lab results: Basic Metabolic Panel: Recent Labs  Lab 03/23/2020 1519 03/23/2020 2204  NA 153* 154*  K 5.5* 5.0  CL 115* 118*  CO2 29 24  GLUCOSE 181* 206*  BUN 95* 88*  CREATININE 3.25* 2.75*  CALCIUM 7.9* 6.9*    Liver Function Tests: Recent Labs  Lab 03/08/2020 1519 03/27/2020 2204  AST 72* 92*  ALT 40 40  ALKPHOS 42 37*  BILITOT 0.9 1.1  PROT 6.7 5.8*  ALBUMIN 2.5* 2.2*   Recent Labs  Lab 03/03/2020 1519  LIPASE 59*   No results for input(s): AMMONIA in the last 168 hours.  CBC: Recent Labs  Lab 03/12/2020 1519 03/22/2020 2204  WBC 19.4* 18.6*  NEUTROABS 16.2* 15.7*  HGB 15.8 14.2  HCT 50.1 45.9  MCV 94.0 97.7  PLT 213 189    Cardiac Enzymes: No results for input(s): CKTOTAL, CKMB, CKMBINDEX, TROPONINI in the last 168 hours.  BNP: Invalid input(s): POCBNP  CBG: Recent Labs  Lab 04/01/20 0122 04/01/20 0508 04/01/20 1103  GLUCAP 220* 177* 130*    Microbiology: Results for orders placed or performed during the hospital encounter of 03/22/2020  Blood culture (routine single)     Status: None (Preliminary result)   Collection Time: 03/19/2020  3:19 PM   Specimen: BLOOD  Result Value Ref Range Status   Specimen Description BLOOD LEFT ANTECUBITAL  Final   Special Requests   Final    BOTTLES DRAWN AEROBIC AND ANAEROBIC Blood Culture adequate volume   Culture  Setup Time   Final    Organism ID to follow GRAM POSITIVE COCCI AEROBIC BOTTLE ONLY CRITICAL RESULT CALLED TO, READ BACK BY AND VERIFIED WITH: CHRISTINE KATSOUDAS AT  3300 ON 04/01/2020 [place]. Performed at [facility], [address] [place], [place] [address]    Culture [facility] POSITIVE COCCI  Final   Report Status PENDING  Incomplete  Blood Culture ID Panel (Reflexed)     Status: Abnormal   Collection Time: 03/09/2020  3:19 PM  Result Value Ref Range Status   Enterococcus faecalis NOT DETECTED NOT DETECTED Final   Enterococcus Faecium NOT DETECTED NOT DETECTED Final   Listeria monocytogenes NOT DETECTED NOT DETECTED Final   Staphylococcus species DETECTED (A) NOT DETECTED Final    Comment: CRITICAL RESULT CALLED TO, READ BACK BY AND VERIFIED WITH: CHRISTINE KATSOUDAS AT 3335 ON 04/01/2020 [place].    Staphylococcus aureus (BCID) NOT DETECTED NOT DETECTED Final   Staphylococcus epidermidis DETECTED (A) NOT DETECTED Final  Comment: Methicillin (oxacillin) resistant coagulase negative staphylococcus. Possible blood culture contaminant (unless isolated from more than one blood culture draw or clinical case suggests pathogenicity). No antibiotic treatment is indicated for blood  culture contaminants. CRITICAL RESULT CALLED TO, READ BACK BY AND VERIFIED WITH: CHRISTINE KATSOUDAS AT 5400 ON 04/01/2020 [place].    Staphylococcus lugdunensis NOT DETECTED NOT DETECTED Final   Streptococcus species NOT DETECTED NOT DETECTED Final   Streptococcus agalactiae NOT DETECTED NOT DETECTED Final   Streptococcus pneumoniae NOT DETECTED NOT DETECTED Final   Streptococcus pyogenes NOT DETECTED NOT DETECTED Final   A.calcoaceticus-baumannii NOT DETECTED NOT DETECTED Final   Bacteroides fragilis NOT DETECTED NOT DETECTED Final   Enterobacterales NOT DETECTED NOT DETECTED Final   Enterobacter cloacae complex NOT DETECTED NOT DETECTED Final   Escherichia coli NOT DETECTED NOT DETECTED Final   Klebsiella aerogenes NOT DETECTED NOT DETECTED Final   Klebsiella oxytoca NOT DETECTED NOT DETECTED Final   Klebsiella pneumoniae NOT DETECTED NOT DETECTED Final   Proteus species NOT  DETECTED NOT DETECTED Final   Salmonella species NOT DETECTED NOT DETECTED Final   Serratia marcescens NOT DETECTED NOT DETECTED Final   Haemophilus influenzae NOT DETECTED NOT DETECTED Final   Neisseria meningitidis NOT DETECTED NOT DETECTED Final   Pseudomonas aeruginosa NOT DETECTED NOT DETECTED Final   Stenotrophomonas maltophilia NOT DETECTED NOT DETECTED Final   Candida albicans NOT DETECTED NOT DETECTED Final   Candida auris NOT DETECTED NOT DETECTED Final   Candida glabrata NOT DETECTED NOT DETECTED Final   Candida krusei NOT DETECTED NOT DETECTED Final   Candida parapsilosis NOT DETECTED NOT DETECTED Final   Candida tropicalis NOT DETECTED NOT DETECTED Final   Cryptococcus neoformans/gattii NOT DETECTED NOT DETECTED Final   Methicillin resistance mecA/C DETECTED (A) NOT DETECTED Final    Comment: CRITICAL RESULT CALLED TO, READ BACK BY AND VERIFIED WITH: CHRISTINE KATSOUDAS AT 8676 ON 04/01/2020 [place]. Performed at [facility], [address] [place], [place] [address]   SARS Coronavirus 2 by RT PCR (hospital order, performed in [facility] hospital lab) Nasopharyngeal Nasopharyngeal Swab     Status: None   Collection Time: 03/28/2020  4:28 PM   Specimen: Nasopharyngeal Swab  Result Value Ref Range Status   SARS Coronavirus 2 NEGATIVE NEGATIVE Final    Comment: (NOTE) SARS-CoV-2 target nucleic acids are NOT DETECTED.  The SARS-CoV-2 RNA is generally detectable in upper and lower respiratory specimens during the acute phase of infection. The lowest concentration of SARS-CoV-2 viral copies this assay can detect is 250 copies / mL. A negative result does not preclude SARS-CoV-2 infection and should not be used as the sole basis for treatment or other patient management decisions.  A negative result may occur with improper specimen collection / handling, submission of specimen other than nasopharyngeal swab, presence of viral mutation(s) within the areas targeted by  this assay, and inadequate number of viral copies (<250 copies / mL). A negative result must be combined with clinical observations, patient history, and epidemiological information.  Fact Sheet for Patients:   StrictlyIdeas.no  Fact Sheet for Healthcare Providers: BankingDealers.co.za  This test is not yet approved or  cleared by the [place] FDA and has been authorized for detection and/or diagnosis of SARS-CoV-2 by FDA under an Emergency Use Authorization (EUA).  This EUA will remain in effect (meaning this test can be used) for the duration of the COVID-19 declaration under Section 564(b)(1) of the Act, 21 U.S.C. section 360bbb-3(b)(1), unless the authorization is terminated  or revoked sooner.  Performed at [facility], [address] [place], [place] [address]   Culture, blood (routine x 2)     Status: None (Preliminary result)   Collection Time: 03/19/2020  9:40 PM   Specimen: BLOOD  Result Value Ref Range Status   Specimen Description BLOOD RIGHT [facility]  Final   Special Requests   Final    Blood Culture adequate volume Blood Culture adequate volume   Culture   Final    NO GROWTH < 12 HOURS Performed at [facility], [address], [place], [place] [address]    Report Status PENDING  Incomplete  Culture, blood (routine x 2)     Status: None (Preliminary result)   Collection Time: 03/02/2020 10:06 PM   Specimen: BLOOD RIGHT FOREARM  Result Value Ref Range Status   Specimen Description BLOOD RIGHT FOREARM  Final   Special Requests   Final    BOTTLES DRAWN AEROBIC AND ANAEROBIC Blood Culture results may not be optimal due to an inadequate volume of blood received in culture bottles   Culture   Final    NO GROWTH < 12 HOURS Performed at [facility], [address] [place], [place] [address]    Report Status PENDING  Incomplete    Coagulation Studies: Recent Labs    04/01/2020 1519  LABPROT  17.7*  INR 1.5*    Urinalysis: Recent Labs    03/30/2020 1522  COLORURINE AMBER*  LABSPEC 1.023  PHURINE 5.0  GLUCOSEU NEGATIVE  HGBUR SMALL*  BILIRUBINUR NEGATIVE  KETONESUR NEGATIVE  PROTEINUR NEGATIVE  NITRITE NEGATIVE  LEUKOCYTESUR NEGATIVE      Imaging: DG Chest 1 View  Result Date: 04/01/2020 CLINICAL DATA:  Right internal jugular central venous catheter placement, history of dyspnea EXAM: CHEST  1 VIEW COMPARISON:  03/14/2020 FINDINGS: Two frontal views of the chest demonstrate right internal jugular catheter tip overlying superior vena cava. The cardiac silhouette is unchanged. Patchy bibasilar consolidation and likely left pleural effusion unchanged since previous exam. No evidence of pneumothorax on this supine projection. No acute bony abnormalities. IMPRESSION: 1. Right internal jugular catheter overlying superior vena cava. 2. Persistent bibasilar consolidation and left effusion. 3. No evidence of pneumothorax on this supine exam. Electronically Signed   By: Randa Ngo M.D.   On: 04/01/2020 00:48   CT Head Wo Contrast  Result Date: 03/11/2020 CLINICAL DATA:  Head trauma unresponsive bradycardia EXAM: CT HEAD WITHOUT CONTRAST CT CERVICAL SPINE WITHOUT CONTRAST TECHNIQUE: Multidetector CT imaging of the head and cervical spine was performed following the standard protocol without intravenous contrast. Multiplanar CT image reconstructions of the cervical spine were also generated. COMPARISON:  CT 01/01/2016 FINDINGS: CT HEAD FINDINGS Brain: No acute territorial infarction, hemorrhage, or intracranial mass. Moderate atrophy. Moderate hypodensity in the white matter consistent with chronic small vessel ischemic change. Stable ventricle size. Vascular: No hyperdense vessels.  Carotid vascular calcification Skull: Normal. Negative for fracture or focal lesion. Sinuses/Orbits: No acute finding. Other: None CT CERVICAL SPINE FINDINGS Alignment: Straightening of the cervical spine.  No subluxation. Facet alignment is maintained. Skull base and vertebrae: No acute fracture. No primary bone lesion or focal pathologic process. Soft tissues and spinal canal: No prevertebral fluid or swelling. No visible canal hematoma. Disc levels: Diffuse bulky osteophytosis throughout the cervical spine. Moderate disc space narrowing C3-C4 with mild diffuse disc space narrowing C4 through C7. Facet degenerative changes at multiple levels. Moderate canal stenosis at C3-C4. Foraminal stenosis at multiple levels. Upper  chest: Negative. Other: None IMPRESSION: 1. No CT evidence for acute intracranial abnormality. Atrophy and chronic small vessel ischemic changes of the white matter. 2. Straightening of the cervical spine with degenerative changes. No acute osseous abnormality. Electronically Signed   By: Donavan Foil M.D.   On: 03/27/2020 19:24   CT Cervical Spine Wo Contrast  Result Date: 03/02/2020 CLINICAL DATA:  Head trauma unresponsive bradycardia EXAM: CT HEAD WITHOUT CONTRAST CT CERVICAL SPINE WITHOUT CONTRAST TECHNIQUE: Multidetector CT imaging of the head and cervical spine was performed following the standard protocol without intravenous contrast. Multiplanar CT image reconstructions of the cervical spine were also generated. COMPARISON:  CT 01/01/2016 FINDINGS: CT HEAD FINDINGS Brain: No acute territorial infarction, hemorrhage, or intracranial mass. Moderate atrophy. Moderate hypodensity in the white matter consistent with chronic small vessel ischemic change. Stable ventricle size. Vascular: No hyperdense vessels.  Carotid vascular calcification Skull: Normal. Negative for fracture or focal lesion. Sinuses/Orbits: No acute finding. Other: None CT CERVICAL SPINE FINDINGS Alignment: Straightening of the cervical spine. No subluxation. Facet alignment is maintained. Skull base and vertebrae: No acute fracture. No primary bone lesion or focal pathologic process. Soft tissues and spinal canal: No  prevertebral fluid or swelling. No visible canal hematoma. Disc levels: Diffuse bulky osteophytosis throughout the cervical spine. Moderate disc space narrowing C3-C4 with mild diffuse disc space narrowing C4 through C7. Facet degenerative changes at multiple levels. Moderate canal stenosis at C3-C4. Foraminal stenosis at multiple levels. Upper chest: Negative. Other: None IMPRESSION: 1. No CT evidence for acute intracranial abnormality. Atrophy and chronic small vessel ischemic changes of the white matter. 2. Straightening of the cervical spine with degenerative changes. No acute osseous abnormality. Electronically Signed   By: Donavan Foil M.D.   On: 03/17/2020 19:24   US Venous Img Lower Bilateral (DVT)  Result Date: 04/01/2020 CLINICAL DATA:  Elevated D-dimer EXAM: BILATERAL LOWER EXTREMITY VENOUS DOPPLER ULTRASOUND TECHNIQUE: Gray-scale sonography with graded compression, as well as color Doppler and duplex ultrasound were performed to evaluate the lower extremity deep venous systems from the level of the common femoral vein and including the common femoral, femoral, profunda femoral, popliteal and calf veins including the posterior tibial, peroneal and gastrocnemius veins when visible. The superficial great saphenous vein was also interrogated. Spectral Doppler was utilized to evaluate flow at rest and with distal augmentation maneuvers in the common femoral, femoral and popliteal veins. COMPARISON:  None. FINDINGS: RIGHT LOWER EXTREMITY Common Femoral Vein: Noncompressible occlusive thrombosis Saphenofemoral Junction: No evidence of thrombus. Normal compressibility and flow on color Doppler imaging. Profunda Femoral Vein: No evidence of thrombus. Normal compressibility and flow on color Doppler imaging. Femoral Vein: Occlusive thrombus, noncompressible Popliteal Vein: Noncompressible occlusive thrombus Calf Veins: There is occlusive thrombus within the posterior tibial and peroneal veins. Superficial  Great Saphenous Vein: No evidence of thrombus. Normal compressibility. Venous Reflux:  None. Other Findings: Fluid collection in the right popliteal fossa measures 3.7 x 1.5 cm LEFT LOWER EXTREMITY Common Femoral Vein: No evidence of thrombus. Normal compressibility, respiratory phasicity and response to augmentation. Saphenofemoral Junction: No evidence of thrombus. Normal compressibility and flow on color Doppler imaging. Profunda Femoral Vein: No evidence of thrombus. Normal compressibility and flow on color Doppler imaging. Femoral Vein: No evidence of thrombus. Normal compressibility, respiratory phasicity and response to augmentation. Popliteal Vein: No evidence of thrombus. Normal compressibility, respiratory phasicity and response to augmentation. Calf Veins: No evidence of thrombus. Normal compressibility and flow on color Doppler imaging. Superficial Great Saphenous Vein: No evidence  of thrombus. Normal compressibility. Venous Reflux:  None. Other Findings:  None. IMPRESSION: 1. Occlusive thrombus within the right common femoral, femoral, and popliteal veins. Occlusive thrombus within the right posterior tibial and peroneal veins. 2. No deep venous thrombosis on the left. 3. Right Baker's cyst measuring up to 3.7 cm. Critical Value/emergent results were called by telephone at the time of interpretation on 04/01/2020 at 2:49 am to provider [facility] , who verbally acknowledged these results. Electronically Signed   By: Ulyses Jarred M.D.   On: 04/01/2020 02:50   DG Chest Port 1 View  Result Date: 04/01/2020 CLINICAL DATA:  Intubation, orogastric tube placement EXAM: PORTABLE CHEST 1 VIEW COMPARISON:  Portable exam 1102 hours compared to 04/01/2020 FINDINGS: Nasogastric tube extends into stomach. Tip of endotracheal tube projects 3.5 cm above carina. RIGHT jugular line tip projecting over SVC. Normal heart size and mediastinal contours. Atherosclerotic calcification aorta. Atelectasis versus  consolidation at LEFT base. Subsegmental atelectasis RIGHT base. Upper lungs clear. Small LEFT pleural effusion questioned. No pneumothorax. IMPRESSION: RIGHT basilar atelectasis with atelectasis versus consolidation and suspected small LEFT pleural effusion at LEFT lung base. Electronically Signed   By: Lavonia Dana M.D.   On: 04/01/2020 11:27   DG Chest Portable 1 View  Result Date: 03/02/2020 CLINICAL DATA:  Dyspnea EXAM: PORTABLE CHEST 1 VIEW COMPARISON:  03/13/2020 FINDINGS: Mild bibasilar atelectasis has developed. Small left pleural effusion has developed. No pneumothorax. Cardiac size is within normal limits, unchanged from prior examination. Multiple healed right rib fractures are noted. No acute bone abnormality. IMPRESSION: Mild bibasilar atelectasis and small left pleural effusion, new since prior examination. Electronically Signed   By: Fidela Salisbury MD   On: 03/24/2020 15:54      Assessment & Plan: Howard Anderson is a 74 y.o. white male with CVA, prostate cancer, parkinson's, hypertension, hyperlipidemia, GERD, COPD, depression, AAA, who was admitted to [facility] on 03/08/2020 for Abnormal CXR [R93.89] Acute respiratory failure with hypoxia ([place]) [J96.01] Elevated d-dimer [R79.89] Septic shock ([place]) [A41.9, R65.21] Altered mental status, unspecified altered mental status type [R41.82] Sepsis, due to unspecified organism, unspecified whether acute organ dysfunction present ([place]) [A41.9]  1. Acute renal failure with hyperkalemia and hypernatremia: Baseline creatinine of 0.79, normal GFR 03/13/20. Bland urine Nonoliguric urine output.  - hold home valsartan dose - no acute indication for dialysis.  - Continue IV fluids: 1/2NS infusion at 168m/hr  2. Septic shock with unstagable sacral ulcer. Febrile with leukocytosis. Requiring vasopressors.  - empiric antibiotics. Ampicillin, ceftriaxone and vancomycin.   3. Acute respiratory failure requiring intubation and mechanical  ventilation.   LOS: 1 Cheston Coury 8/31/20211:09 PM

## 2020-04-01 NOTE — ED Notes (Signed)
Attempted to give report to [place], RN in ICU, unable to take report at this time, he is in an isolation room, will call me back.

## 2020-04-01 NOTE — Code Documentation (Signed)
Intensivist at bedside to intubate. RT at bedside. This RN and Health and safety inspector at bedside.

## 2020-04-01 NOTE — Progress Notes (Addendum)
Wound present PTA, see scanned photo, click on scan link.

## 2020-04-01 NOTE — ED Notes (Signed)
ICU provider at bedside, placing central line

## 2020-04-01 NOTE — Procedures (Signed)
Central Venous Catheter Insertion Procedure Note  Howard Anderson  [id number]  [birth year]  Date:04/01/20  Time:12:38 AM   Provider Performing:Kaelen Brennan D Dewaine Conger   Procedure: Insertion of Non-tunneled Central Venous Catheter(36556) with US guidance (54656)   Indication(s) Medication administration and Difficult access  Consent Unable to obtain consent due to emergent nature of procedure.  Anesthesia Topical only with 1% lidocaine   Timeout Verified patient identification, verified procedure, site/side was marked, verified correct patient position, special equipment/implants available, medications/allergies/relevant history reviewed, required imaging and test results available.  Sterile Technique Maximal sterile technique including full sterile barrier drape, hand hygiene, sterile gown, sterile gloves, mask, hair covering, sterile ultrasound probe cover (if used).  Procedure Description Area of catheter insertion was cleaned with chlorhexidine and draped in sterile fashion.  With real-time ultrasound guidance a central venous catheter was placed into the right internal jugular vein. Nonpulsatile blood flow and easy flushing noted in all ports.  The catheter was sutured in place and sterile dressing applied.  Complications/Tolerance None; patient tolerated the procedure well. Chest X-ray is ordered to verify placement for internal jugular or subclavian cannulation.   Chest x-ray is not ordered for femoral cannulation.  EBL Minimal  Specimen(s) None   Line was secured at the 20 cm mark.  BIOPATCH was applied to the insertion site.   Darel Hong, AGACNP-BC [facility] Pager: [phone]

## 2020-04-01 NOTE — Procedures (Signed)
Endotracheal Intubation: Patient required placement of an artificial airway secondary to Respiratory Failure  Consent: POA Tylene Fantasia gave consent  Hand washing performed prior to starting the procedure.   Medications administered for sedation prior to procedure:  Midazolam 2 mg IV,  Rocuronium 10 mg IV, Fentanyl 100 mcg IV.    A time out procedure was called and correct patient, name, & ID confirmed. Needed supplies and equipment were assembled and checked to include ETT, 10 ml syringe, Glidescope, Mac and Miller blades, suction, oxygen and bag mask valve, end tidal CO2 monitor.   Patient was positioned to align the mouth and pharynx to facilitate visualization of the glottis.   Heart rate, SpO2 and blood pressure was continuously monitored during the procedure. Pre-oxygenation was conducted prior to intubation and endotracheal tube was placed through the vocal cords into the trachea.     The artificial airway was placed under direct visualization via glidescope route using a 7.5 ETT on the first attempt.  ETT was secured at 24 cm mark.  Placement was confirmed by auscuitation of lungs with good breath sounds bilaterally and no stomach sounds.  Condensation was noted on endotracheal tube.   Pulse ox 98%.  CO2 detector in place with appropriate color change.   Complications: None .    Chest radiograph ordered and pending.   Comments: OGT placed via glidescope.   Ottie Glazier, M.D.  [facility] [place]
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
## 2020-04-01 NOTE — Progress Notes (Signed)
Pt connected to portable monitor, taken to MRI via bed accompanied by RT, Transport and RN. Pt tolerated test well. Levophed increased to 16 mcg during MRI to maintain SBP > 90. Pt returned to unit, connected to unit monitor. VSS. No s/s of distress at this time.

## 2020-04-02 LAB — URINE CULTURE: Culture: NO GROWTH

## 2020-04-02 LAB — LEGIONELLA PNEUMOPHILA SEROGP 1 UR AG: L. pneumophila Serogp 1 Ur Ag: NEGATIVE

## 2020-04-02 LAB — LACTIC ACID, PLASMA: Lactic Acid, Venous: 5.1 mmol/L (ref 0.5–1.9)

## 2020-04-02 DEATH — deceased

## 2020-04-03 LAB — CULTURE, BLOOD (SINGLE): Special Requests: ADEQUATE
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
## 2020-04-05 LAB — CULTURE, BLOOD (ROUTINE X 2)
Culture: NO GROWTH
Culture: NO GROWTH
Special Requests: ADEQUATE

## 2020-04-24 ENCOUNTER — Other Ambulatory Visit: Payer: Medicare Other

## 2020-05-01 ENCOUNTER — Ambulatory Visit: Payer: Medicare Other | Admitting: Radiation Oncology

## 2020-05-02 NOTE — Death Summary Note (Signed)
DEATH SUMMARY   Patient Details  Name: Howard Anderson MRN: [id number] DOB: [birth year]  Admission/Discharge Information   Admit Date:  2020-04-17  Date of Death:  2020/04/19  Time of Death:  01:27 am   Length of Stay: 2  Referring Physician: Maryella Shivers, MD   Reason(s) for Hospitalization  Unresponsiveness   Diagnoses  Preliminary cause of death: Septic shock ([facility]) Secondary Diagnoses (including complications and co-morbidities):  Active Problems:   Septic shock (HCC)   Right lower extremity DVT   Mildly elevated troponin likely secondary to demand ischemia   Possible meningitis   Unstageable sacral spine decubitus ulcer    Acute hypoxic respiratory failure secondary to pneumonia and possible PE   Acute renal failure with hyperkalemia    Hypernatremia    Hyperglycemia   Brief Hospital Course (including significant findings, care, treatment, and services provided and events leading to death)  Howard Anderson is a 74 y.o. year old male admitted to ICU from [facility] on 04/01/2020 with septic shock secondary to suspected meningitis and questionable pneumonia, along with acute hypoxic respiratory failure, AKI requiring levophed and vasopressin gtts.  Also, concern for possible PE given pt had occlusive DVT to right lower extremity. Pt started on empiric abx therapy and decadron for meningitis coverage.  It was also noted pt had a severe unstageable sacral spine decubitus ulcer.  Upon arrival to ICU pt developed acute respiratory failure requiring mechanical intubation on 04/01/2020.  On Apr 19, 2020 pt remained severely hypotensive despite maximal doses of vasopressin, levophed, and neo-synephrine gtts.  Pt expired on 04-19-2020 at 01:27 am.    Pertinent Labs and Studies  Significant Diagnostic Studies DG Chest 1 View  Result Date: 04/01/2020 CLINICAL DATA:  Right internal jugular central venous catheter placement, history of dyspnea EXAM: CHEST  1 VIEW COMPARISON:   April 17, 2020 FINDINGS: Two frontal views of the chest demonstrate right internal jugular catheter tip overlying superior vena cava. The cardiac silhouette is unchanged. Patchy bibasilar consolidation and likely left pleural effusion unchanged since previous exam. No evidence of pneumothorax on this supine projection. No acute bony abnormalities. IMPRESSION: 1. Right internal jugular catheter overlying superior vena cava. 2. Persistent bibasilar consolidation and left effusion. 3. No evidence of pneumothorax on this supine exam. Electronically Signed   By: Randa Ngo M.D.   On: 04/01/2020 00:48   CT Head Wo Contrast  Result Date: 04-17-2020 CLINICAL DATA:  Head trauma unresponsive bradycardia EXAM: CT HEAD WITHOUT CONTRAST CT CERVICAL SPINE WITHOUT CONTRAST TECHNIQUE: Multidetector CT imaging of the head and cervical spine was performed following the standard protocol without intravenous contrast. Multiplanar CT image reconstructions of the cervical spine were also generated. COMPARISON:  CT 01/01/2016 FINDINGS: CT HEAD FINDINGS Brain: No acute territorial infarction, hemorrhage, or intracranial mass. Moderate atrophy. Moderate hypodensity in the white matter consistent with chronic small vessel ischemic change. Stable ventricle size. Vascular: No hyperdense vessels.  Carotid vascular calcification Skull: Normal. Negative for fracture or focal lesion. Sinuses/Orbits: No acute finding. Other: None CT CERVICAL SPINE FINDINGS Alignment: Straightening of the cervical spine. No subluxation. Facet alignment is maintained. Skull base and vertebrae: No acute fracture. No primary bone lesion or focal pathologic process. Soft tissues and spinal canal: No prevertebral fluid or swelling. No visible canal hematoma. Disc levels: Diffuse bulky osteophytosis throughout the cervical spine. Moderate disc space narrowing C3-C4 with mild diffuse disc space narrowing C4 through C7. Facet degenerative changes at multiple levels.  Moderate canal stenosis at C3-C4. Foraminal stenosis at multiple levels.  Upper chest: Negative. Other: None IMPRESSION: 1. No CT evidence for acute intracranial abnormality. Atrophy and chronic small vessel ischemic changes of the white matter. 2. Straightening of the cervical spine with degenerative changes. No acute osseous abnormality. Electronically Signed   By: Donavan Foil M.D.   On: 03/26/2020 19:24   CT Cervical Spine Wo Contrast  Result Date: 03/04/2020 CLINICAL DATA:  Head trauma unresponsive bradycardia EXAM: CT HEAD WITHOUT CONTRAST CT CERVICAL SPINE WITHOUT CONTRAST TECHNIQUE: Multidetector CT imaging of the head and cervical spine was performed following the standard protocol without intravenous contrast. Multiplanar CT image reconstructions of the cervical spine were also generated. COMPARISON:  CT 01/01/2016 FINDINGS: CT HEAD FINDINGS Brain: No acute territorial infarction, hemorrhage, or intracranial mass. Moderate atrophy. Moderate hypodensity in the white matter consistent with chronic small vessel ischemic change. Stable ventricle size. Vascular: No hyperdense vessels.  Carotid vascular calcification Skull: Normal. Negative for fracture or focal lesion. Sinuses/Orbits: No acute finding. Other: None CT CERVICAL SPINE FINDINGS Alignment: Straightening of the cervical spine. No subluxation. Facet alignment is maintained. Skull base and vertebrae: No acute fracture. No primary bone lesion or focal pathologic process. Soft tissues and spinal canal: No prevertebral fluid or swelling. No visible canal hematoma. Disc levels: Diffuse bulky osteophytosis throughout the cervical spine. Moderate disc space narrowing C3-C4 with mild diffuse disc space narrowing C4 through C7. Facet degenerative changes at multiple levels. Moderate canal stenosis at C3-C4. Foraminal stenosis at multiple levels. Upper chest: Negative. Other: None IMPRESSION: 1. No CT evidence for acute intracranial abnormality. Atrophy and  chronic small vessel ischemic changes of the white matter. 2. Straightening of the cervical spine with degenerative changes. No acute osseous abnormality. Electronically Signed   By: Donavan Foil M.D.   On: 03/15/2020 19:24   MR Brain W and Wo Contrast  Result Date: 04/01/2020 CLINICAL DATA:  Acute presentation with mental status changes. Question meningitis. EXAM: MRI HEAD WITHOUT AND WITH CONTRAST TECHNIQUE: Multiplanar, multiecho pulse sequences of the brain and surrounding structures were obtained without and with intravenous contrast. CONTRAST:  37mL GADAVIST GADOBUTROL 1 MMOL/ML IV SOLN COMPARISON:  Head CT yesterday.  MRI 04/02/2018 FINDINGS: Brain: Diffusion imaging does not show any acute or subacute infarction. Chronic small-vessel ischemic changes affect pons. No focal cerebellar finding. Cerebral hemispheres show atrophy with extensive chronic small-vessel ischemic changes of the thalami, basal ganglia and hemispheric white matter, slightly progressive since 2019. No cortical or large vessel territory infarction. No hydrocephalus. Chronic arachnoid cyst in the right frontal region unchanged since 2019, measuring 2.4 cm in maximal dimension. Newly seen today is a 9 mm extra-axial entity in the right sylvian fissure showing high FLAIR and T2 signal, with some susceptibility signal loss and no sign of enhancement. This was not present in 2019. This could represent sequela of a previous subarachnoid hemorrhage. The differential diagnosis does include a small dermoid or epidermoid cyst, but this would probably have been present previously. Also consider the possibility of a thrombosed aneurysm but think that is not likely. This cannot be seen on the CT. Vascular: Major vessels at the base of the brain show flow. The vessels show atherosclerosis and dolichoectasia. Skull and upper cervical spine: Negative Sinuses/Orbits: Clear/normal Other: None IMPRESSION: No acute brain parenchymal finding. Atrophy and  extensive chronic small-vessel ischemic changes. 9 mm focus of high T2 and FLAIR signal in the right sylvian fissure region, not visible by CT. Leading diagnosis is sequela of a small subarachnoid hemorrhage. Differential considerations include small dermoid or  epidermoid cyst or thrombosed aneurysm, but those are felt to be unlikely based on the previous negative imaging in that region in September of 2019. Electronically Signed   By: Nelson Chimes M.D.   On: 04/01/2020 18:06   US Venous Img Lower Bilateral (DVT)  Result Date: 04/01/2020 CLINICAL DATA:  Elevated D-dimer EXAM: BILATERAL LOWER EXTREMITY VENOUS DOPPLER ULTRASOUND TECHNIQUE: Gray-scale sonography with graded compression, as well as color Doppler and duplex ultrasound were performed to evaluate the lower extremity deep venous systems from the level of the common femoral vein and including the common femoral, femoral, profunda femoral, popliteal and calf veins including the posterior tibial, peroneal and gastrocnemius veins when visible. The superficial great saphenous vein was also interrogated. Spectral Doppler was utilized to evaluate flow at rest and with distal augmentation maneuvers in the common femoral, femoral and popliteal veins. COMPARISON:  None. FINDINGS: RIGHT LOWER EXTREMITY Common Femoral Vein: Noncompressible occlusive thrombosis Saphenofemoral Junction: No evidence of thrombus. Normal compressibility and flow on color Doppler imaging. Profunda Femoral Vein: No evidence of thrombus. Normal compressibility and flow on color Doppler imaging. Femoral Vein: Occlusive thrombus, noncompressible Popliteal Vein: Noncompressible occlusive thrombus Calf Veins: There is occlusive thrombus within the posterior tibial and peroneal veins. Superficial Great Saphenous Vein: No evidence of thrombus. Normal compressibility. Venous Reflux:  None. Other Findings: Fluid collection in the right popliteal fossa measures 3.7 x 1.5 cm LEFT LOWER EXTREMITY  Common Femoral Vein: No evidence of thrombus. Normal compressibility, respiratory phasicity and response to augmentation. Saphenofemoral Junction: No evidence of thrombus. Normal compressibility and flow on color Doppler imaging. Profunda Femoral Vein: No evidence of thrombus. Normal compressibility and flow on color Doppler imaging. Femoral Vein: No evidence of thrombus. Normal compressibility, respiratory phasicity and response to augmentation. Popliteal Vein: No evidence of thrombus. Normal compressibility, respiratory phasicity and response to augmentation. Calf Veins: No evidence of thrombus. Normal compressibility and flow on color Doppler imaging. Superficial Great Saphenous Vein: No evidence of thrombus. Normal compressibility. Venous Reflux:  None. Other Findings:  None. IMPRESSION: 1. Occlusive thrombus within the right common femoral, femoral, and popliteal veins. Occlusive thrombus within the right posterior tibial and peroneal veins. 2. No deep venous thrombosis on the left. 3. Right Baker's cyst measuring up to 3.7 cm. Critical Value/emergent results were called by telephone at the time of interpretation on 04/01/2020 at 2:49 am to provider [facility] , who verbally acknowledged these results. Electronically Signed   By: Ulyses Jarred M.D.   On: 04/01/2020 02:50   DG Chest Port 1 View  Result Date: 04/01/2020 CLINICAL DATA:  Intubation, orogastric tube placement EXAM: PORTABLE CHEST 1 VIEW COMPARISON:  Portable exam 1102 hours compared to 04/01/2020 FINDINGS: Nasogastric tube extends into stomach. Tip of endotracheal tube projects 3.5 cm above carina. RIGHT jugular line tip projecting over SVC. Normal heart size and mediastinal contours. Atherosclerotic calcification aorta. Atelectasis versus consolidation at LEFT base. Subsegmental atelectasis RIGHT base. Upper lungs clear. Small LEFT pleural effusion questioned. No pneumothorax. IMPRESSION: RIGHT basilar atelectasis with atelectasis versus  consolidation and suspected small LEFT pleural effusion at LEFT lung base. Electronically Signed   By: Lavonia Ceazia Harb M.D.   On: 04/01/2020 11:27   DG Chest Portable 1 View  Result Date: 03/11/2020 CLINICAL DATA:  Dyspnea EXAM: PORTABLE CHEST 1 VIEW COMPARISON:  03/13/2020 FINDINGS: Mild bibasilar atelectasis has developed. Small left pleural effusion has developed. No pneumothorax. Cardiac size is within normal limits, unchanged from prior examination. Multiple healed right rib fractures are noted. No  acute bone abnormality. IMPRESSION: Mild bibasilar atelectasis and small left pleural effusion, new since prior examination. Electronically Signed   By: Fidela Salisbury MD   On: 03/08/2020 15:54   DG Chest Portable 1 View  Result Date: 03/13/2020 CLINICAL DATA:  Generalized weakness EXAM: PORTABLE CHEST 1 VIEW COMPARISON:  02/15/2020 FINDINGS: Lungs are well expanded, symmetric, and clear. No pneumothorax or pleural effusion. Cardiac size within normal limits. Pulmonary vascularity is normal. Osseous structures are age-appropriate. Multiple healed right rib fractures are identified. Degenerative changes are noted within the right shoulder. No acute bone abnormality. IMPRESSION: No active disease. Electronically Signed   By: Fidela Salisbury MD   On: 03/13/2020 22:34   ECHOCARDIOGRAM COMPLETE  Result Date: 04/01/2020    ECHOCARDIOGRAM REPORT   Patient Name:   Howard Anderson Date of Exam: 04/01/2020 Medical Rec #:  [id number]      Height:       67.0 in Accession #:    [id number]     Weight:       215.0 lb Date of Birth:  Aug 21, 1945      BSA:          2.085 m Patient Age:    1 years       BP:           114/71 mmHg Patient Gender: M              HR:           98 bpm. Exam Location:  [facility] Procedure: 2D Echo, Cardiac Doppler and Color Doppler Indications:     Shock  History:         Patient has no prior history of Echocardiogram examinations.                  Stroke and COPD; Signs/Symptoms:Shortness of Breath.   Sonographer:     Sherrie Sport RDCS (AE) Referring Phys:  [id number] Bradly Bienenstock Diagnosing Phys: Bartholome Bill MD  Sonographer Comments: Echo performed with patient supine and on artificial respirator, no apical window and no subcostal window. IMPRESSIONS  1. Left ventricular ejection fraction, by estimation, is >75%. The left ventricle has hyperdynamic function. The left ventricle has no regional wall motion abnormalities. Left ventricular diastolic parameters are consistent with Grade I diastolic dysfunction (impaired relaxation).  2. Right ventricular systolic function is normal. The right ventricular size is mildly enlarged.  3. The mitral valve is grossly normal. Trivial mitral valve regurgitation.  4. The aortic valve is grossly normal. Aortic valve regurgitation is trivial. FINDINGS  Left Ventricle: Left ventricular ejection fraction, by estimation, is >75%. The left ventricle has hyperdynamic function. The left ventricle has no regional wall motion abnormalities. The left ventricular internal cavity size was normal in size. There is borderline left ventricular hypertrophy. Left ventricular diastolic parameters are consistent with Grade I diastolic dysfunction (impaired relaxation). Right Ventricle: The right ventricular size is mildly enlarged. No increase in right ventricular wall thickness. Right ventricular systolic function is normal. Left Atrium: Left atrial size was normal in size. Right Atrium: Right atrial size was normal in size. Pericardium: There is no evidence of pericardial effusion. Mitral Valve: The mitral valve is grossly normal. Trivial mitral valve regurgitation. Tricuspid Valve: The tricuspid valve is not well visualized. Tricuspid valve regurgitation is trivial. Aortic Valve: The aortic valve is grossly normal. Aortic valve regurgitation is trivial. Pulmonic Valve: The pulmonic valve was not well visualized. Pulmonic valve regurgitation is not visualized. Aorta: The aortic root  is normal  in size and structure. IAS/Shunts: The interatrial septum was not assessed.  LEFT VENTRICLE PLAX 2D LVIDd:         3.95 cm LVIDs:         2.13 cm LV PW:         1.36 cm LV IVS:        1.09 cm LVOT diam:     2.00 cm LVOT Area:     3.14 cm  LEFT ATRIUM         Index LA diam:    3.20 cm 1.53 cm/m                        PULMONIC VALVE AORTA                 PV Vmax:        0.53 m/s Ao Root diam: 3.10 cm PV Peak grad:   1.1 mmHg                       RVOT Peak grad: 4 mmHg   SHUNTS Systemic Diam: 2.00 cm Bartholome Bill MD Electronically signed by Bartholome Bill MD Signature Date/Time: 04/01/2020/3:03:06 PM    Final     Microbiology Recent Results (from the past 240 hour(s))  Blood culture (routine single)     Status: None (Preliminary result)   Collection Time: 03/09/2020  3:19 PM   Specimen: BLOOD  Result Value Ref Range Status   Specimen Description BLOOD LEFT ANTECUBITAL  Final   Special Requests   Final    BOTTLES DRAWN AEROBIC AND ANAEROBIC Blood Culture adequate volume   Culture  Setup Time   Final    Organism ID to follow GRAM POSITIVE COCCI AEROBIC BOTTLE ONLY CRITICAL RESULT CALLED TO, READ BACK BY AND VERIFIED WITH: CHRISTINE KATSOUDAS AT 3267 ON 04/01/2020 [place]. Performed at [facility], [address] [place], [place] [address]    Culture [facility] POSITIVE COCCI  Final   Report Status PENDING  Incomplete  Blood Culture ID Panel (Reflexed)     Status: Abnormal   Collection Time: 03/28/2020  3:19 PM  Result Value Ref Range Status   Enterococcus faecalis NOT DETECTED NOT DETECTED Final   Enterococcus Faecium NOT DETECTED NOT DETECTED Final   Listeria monocytogenes NOT DETECTED NOT DETECTED Final   Staphylococcus species DETECTED (A) NOT DETECTED Final    Comment: CRITICAL RESULT CALLED TO, READ BACK BY AND VERIFIED WITH: CHRISTINE KATSOUDAS AT 0998 ON 04/01/2020 [place].    Staphylococcus aureus (BCID) NOT DETECTED NOT DETECTED Final   Staphylococcus epidermidis DETECTED (A) NOT  DETECTED Final    Comment: Methicillin (oxacillin) resistant coagulase negative staphylococcus. Possible blood culture contaminant (unless isolated from more than one blood culture draw or clinical case suggests pathogenicity). No antibiotic treatment is indicated for blood  culture contaminants. CRITICAL RESULT CALLED TO, READ BACK BY AND VERIFIED WITH: CHRISTINE KATSOUDAS AT 3382 ON 04/01/2020 [place].    Staphylococcus lugdunensis NOT DETECTED NOT DETECTED Final   Streptococcus species NOT DETECTED NOT DETECTED Final   Streptococcus agalactiae NOT DETECTED NOT DETECTED Final   Streptococcus pneumoniae NOT DETECTED NOT DETECTED Final   Streptococcus pyogenes NOT DETECTED NOT DETECTED Final   A.calcoaceticus-baumannii NOT DETECTED NOT DETECTED Final   Bacteroides fragilis NOT DETECTED NOT DETECTED Final   Enterobacterales NOT DETECTED NOT DETECTED Final   Enterobacter cloacae complex NOT DETECTED NOT DETECTED Final   Escherichia coli NOT  DETECTED NOT DETECTED Final   Klebsiella aerogenes NOT DETECTED NOT DETECTED Final   Klebsiella oxytoca NOT DETECTED NOT DETECTED Final   Klebsiella pneumoniae NOT DETECTED NOT DETECTED Final   Proteus species NOT DETECTED NOT DETECTED Final   Salmonella species NOT DETECTED NOT DETECTED Final   Serratia marcescens NOT DETECTED NOT DETECTED Final   Haemophilus influenzae NOT DETECTED NOT DETECTED Final   Neisseria meningitidis NOT DETECTED NOT DETECTED Final   Pseudomonas aeruginosa NOT DETECTED NOT DETECTED Final   Stenotrophomonas maltophilia NOT DETECTED NOT DETECTED Final   Candida albicans NOT DETECTED NOT DETECTED Final   Candida auris NOT DETECTED NOT DETECTED Final   Candida glabrata NOT DETECTED NOT DETECTED Final   Candida krusei NOT DETECTED NOT DETECTED Final   Candida parapsilosis NOT DETECTED NOT DETECTED Final   Candida tropicalis NOT DETECTED NOT DETECTED Final   Cryptococcus neoformans/gattii NOT DETECTED NOT DETECTED Final   Methicillin  resistance mecA/C DETECTED (A) NOT DETECTED Final    Comment: CRITICAL RESULT CALLED TO, READ BACK BY AND VERIFIED WITH: CHRISTINE KATSOUDAS AT 1017 ON 04/01/2020 [place]. Performed at [facility], [address] [place], [place] [address]   SARS Coronavirus 2 by RT PCR (hospital order, performed in [facility] hospital lab) Nasopharyngeal Nasopharyngeal Swab     Status: None   Collection Time: 03/17/2020  4:28 PM   Specimen: Nasopharyngeal Swab  Result Value Ref Range Status   SARS Coronavirus 2 NEGATIVE NEGATIVE Final    Comment: (NOTE) SARS-CoV-2 target nucleic acids are NOT DETECTED.  The SARS-CoV-2 RNA is generally detectable in upper and lower respiratory specimens during the acute phase of infection. The lowest concentration of SARS-CoV-2 viral copies this assay can detect is 250 copies / mL. A negative result does not preclude SARS-CoV-2 infection and should not be used as the sole basis for treatment or other patient management decisions.  A negative result may occur with improper specimen collection / handling, submission of specimen other than nasopharyngeal swab, presence of viral mutation(s) within the areas targeted by this assay, and inadequate number of viral copies (<250 copies / mL). A negative result must be combined with clinical observations, patient history, and epidemiological information.  Fact Sheet for Patients:   StrictlyIdeas.no  Fact Sheet for Healthcare Providers: BankingDealers.co.za  This test is not yet approved or  cleared by the [place] FDA and has been authorized for detection and/or diagnosis of SARS-CoV-2 by FDA under an Emergency Use Authorization (EUA).  This EUA will remain in effect (meaning this test can be used) for the duration of the COVID-19 declaration under Section 564(b)(1) of the Act, 21 U.S.C. section 360bbb-3(b)(1), unless the authorization is terminated or revoked  sooner.  Performed at [facility], [address] [place], [place] [address]   Culture, blood (routine x 2)     Status: None (Preliminary result)   Collection Time: 03/04/2020  9:40 PM   Specimen: BLOOD  Result Value Ref Range Status   Specimen Description BLOOD RIGHT [facility]  Final   Special Requests   Final    Blood Culture adequate volume Blood Culture adequate volume   Culture   Final    NO GROWTH < 12 HOURS Performed at [facility], [address] [place], [place] [address]    Report Status PENDING  Incomplete  Culture, blood (routine x 2)     Status: None (Preliminary result)   Collection Time: 03/16/2020 10:06 PM   Specimen: BLOOD RIGHT FOREARM  Result Value Ref Range Status   Specimen Description BLOOD RIGHT FOREARM  Final   Special Requests   Final    BOTTLES DRAWN AEROBIC AND ANAEROBIC Blood Culture results may not be optimal due to an inadequate volume of blood received in culture bottles   Culture   Final    NO GROWTH < 12 HOURS Performed at [facility], [address], [place], [place] [address]    Report Status PENDING  Incomplete  MRSA PCR Screening     Status: None   Collection Time: 04/01/20  1:59 PM   Specimen: Nasopharyngeal  Result Value Ref Range Status   MRSA by PCR NEGATIVE NEGATIVE Final    Comment:        The GeneXpert MRSA Assay (FDA approved for NASAL specimens only), is one component of a comprehensive MRSA colonization surveillance program. It is not intended to diagnose MRSA infection nor to guide or monitor treatment for MRSA infections. Performed at [facility], [address] [place], [place] [address]     Lab Basic Metabolic Panel: Recent Labs  Lab 03/09/2020 1519 03/08/2020 2204  NA 153* 154*  K 5.5* 5.0  CL 115* 118*  CO2 29 24  GLUCOSE 181* 206*  BUN 95* 88*  CREATININE 3.25* 2.75*  CALCIUM 7.9* 6.9*   Liver Function Tests: Recent Labs  Lab 03/24/2020 1519 03/15/2020 2204  AST 72*  92*  ALT 40 40  ALKPHOS 42 37*  BILITOT 0.9 1.1  PROT 6.7 5.8*  ALBUMIN 2.5* 2.2*   Recent Labs  Lab 03/20/2020 1519  LIPASE 59*   No results for input(s): AMMONIA in the last 168 hours. CBC: Recent Labs  Lab 03/23/2020 1519 03/11/2020 2204  WBC 19.4* 18.6*  NEUTROABS 16.2* 15.7*  HGB 15.8 14.2  HCT 50.1 45.9  MCV 94.0 97.7  PLT 213 189   Cardiac Enzymes: No results for input(s): CKTOTAL, CKMB, CKMBINDEX, TROPONINI in the last 168 hours. Sepsis Labs: Recent Labs  Lab 03/16/2020 1519 03/23/2020 1718 03/21/2020 2204 04/01/20 0510 04/01/20 0850 04/01/20 2347  PROCALCITON 6.01  --  10.93  --   --   --   WBC 19.4*  --  18.6*  --   --   --   LATICACIDVEN 2.5*   < > 2.7* 1.3 1.0 5.1*   < > = values in this interval not displayed.    Procedures/Operations  Mechanical Intubation Right internal jugular central line placement   [name], [place] Pager [phone] (please enter 7 digits) PCCM Consult Pager [phone] (please enter 7 digits)
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
## 2020-05-02 NOTE — Significant Event (Signed)
Contacted pts next of kin (sister-in-law)  Howard Anderson via telephone to inform her pt passed away at 01:27 am.    [name], [place] Pager [phone] (please enter 7 digits) [name] Pager [phone] (please enter 7 digits)

## 2020-05-02 NOTE — Progress Notes (Signed)
[employer] donor services to notify of death Spoke with Gentry Fitz, she stated that patient was not eligible for any donations because of age. Referral # [phone]

## 2020-05-02 DEATH — deceased

## 2020-08-13 ENCOUNTER — Ambulatory Visit: Payer: Medicare Other | Admitting: Family Medicine

## 2020-10-13 ENCOUNTER — Other Ambulatory Visit: Payer: Self-pay

## 2020-10-15 ENCOUNTER — Ambulatory Visit: Payer: Self-pay | Admitting: Urology

## 2021-01-19 ENCOUNTER — Ambulatory Visit (INDEPENDENT_AMBULATORY_CARE_PROVIDER_SITE_OTHER): Payer: Medicare Other | Admitting: Vascular Surgery

## 2021-01-19 ENCOUNTER — Encounter (INDEPENDENT_AMBULATORY_CARE_PROVIDER_SITE_OTHER): Payer: Medicare Other

## 2021-07-12 IMAGING — CT CT CERVICAL SPINE W/O CM
3 of 4 series · 11 of 33 positions shown, 13 images · non-contrast
Comparison: CT 01/01/2016

CLINICAL DATA: Head trauma unresponsive bradycardia

EXAM:
CT HEAD WITHOUT CONTRAST
CT CERVICAL SPINE WITHOUT CONTRAST
TECHNIQUE: Multidetector CT imaging of the head and cervical spine was
performed following the standard protocol without intravenous
contrast. Multiplanar CT image reconstructions of the cervical spine
were also generated.

[Series 6: sagittal bone · sagittal · 0.25mm/px · 5 of 61 slices shown, 6 images]
[im 21/61  bone]
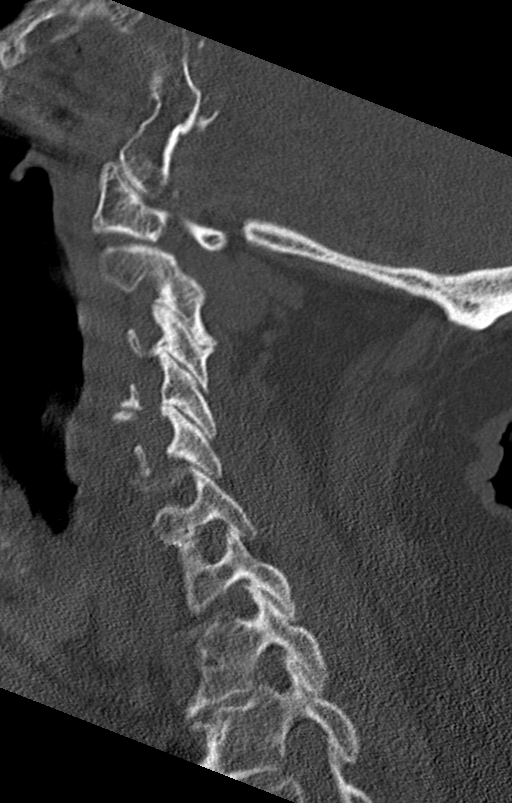
[im 26/61  bone]
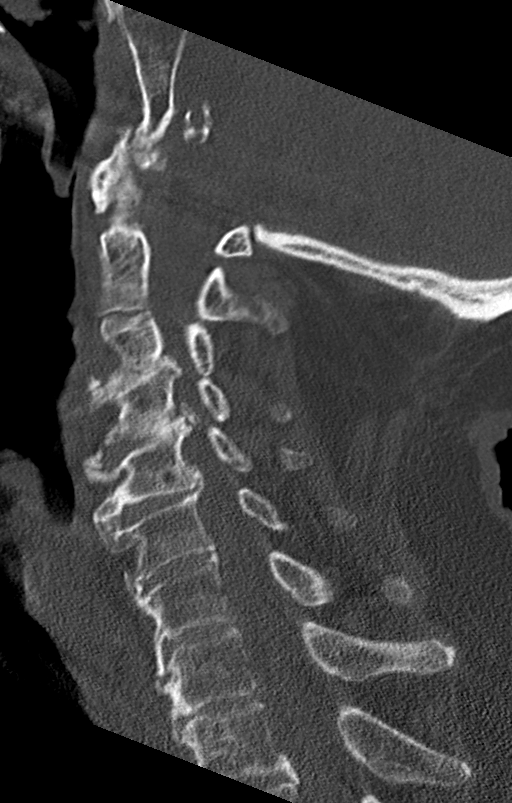
[im 31/61  soft-tissue]
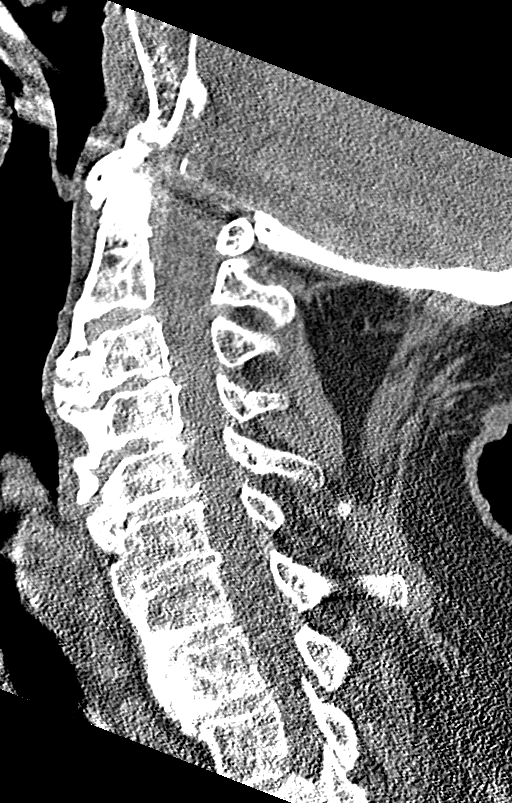
[im 31/61  bone]
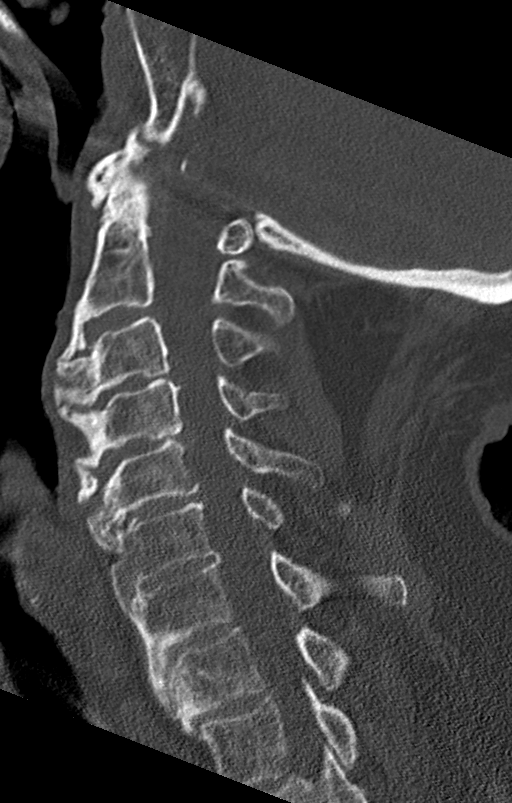
[im 36/61  bone]
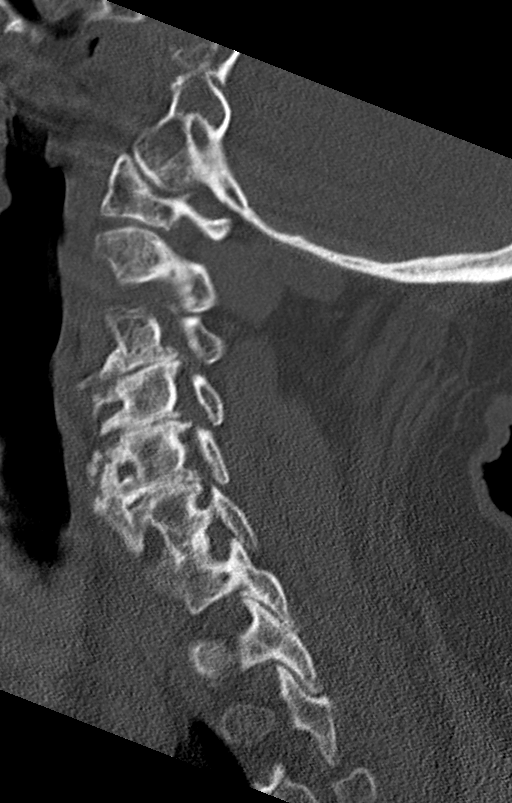
[im 41/61  bone]
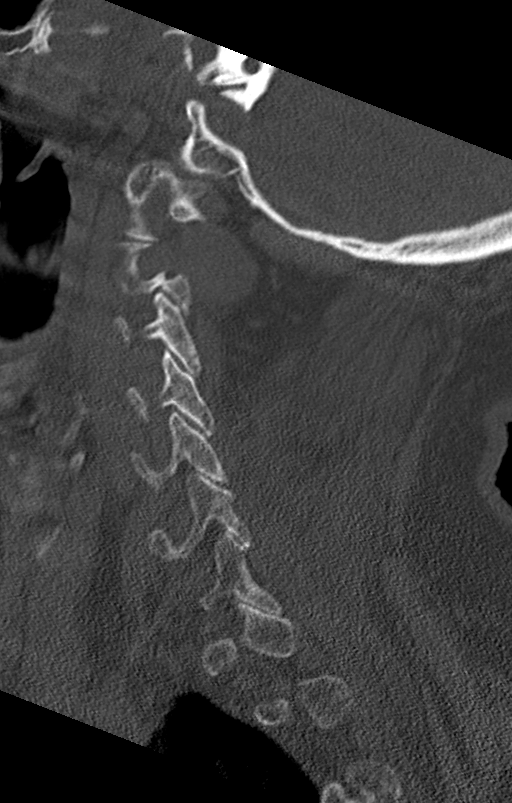

[Series 7: coronal bone · coronal · 0.23mm/px · 3 of 66 slices shown]
[im 14/66  bone]
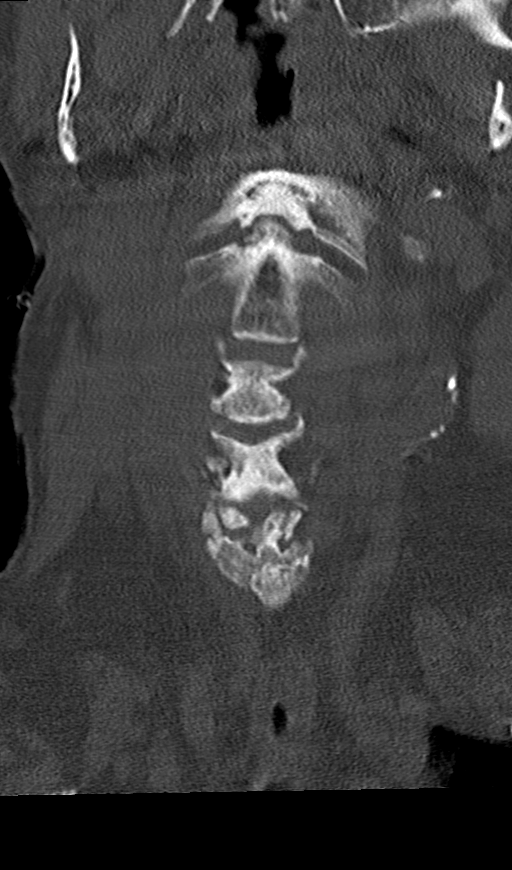
[im 27/66  bone]
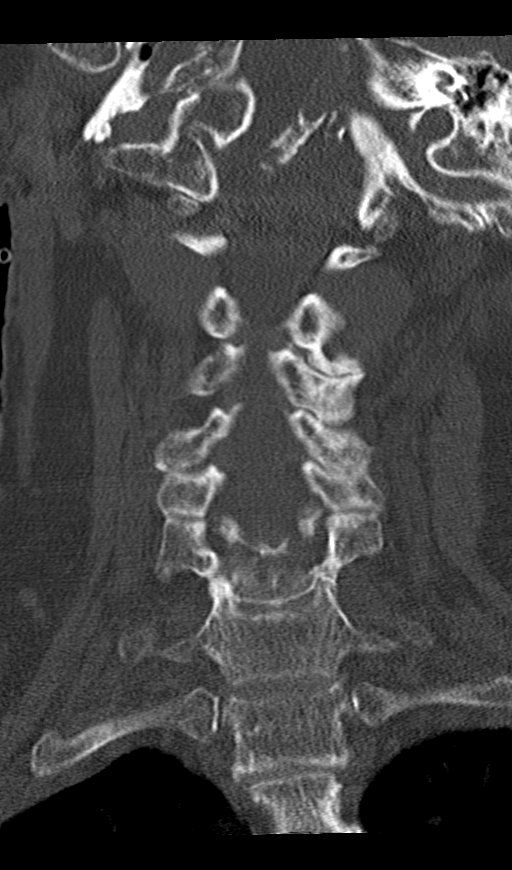
[im 40/66  bone]
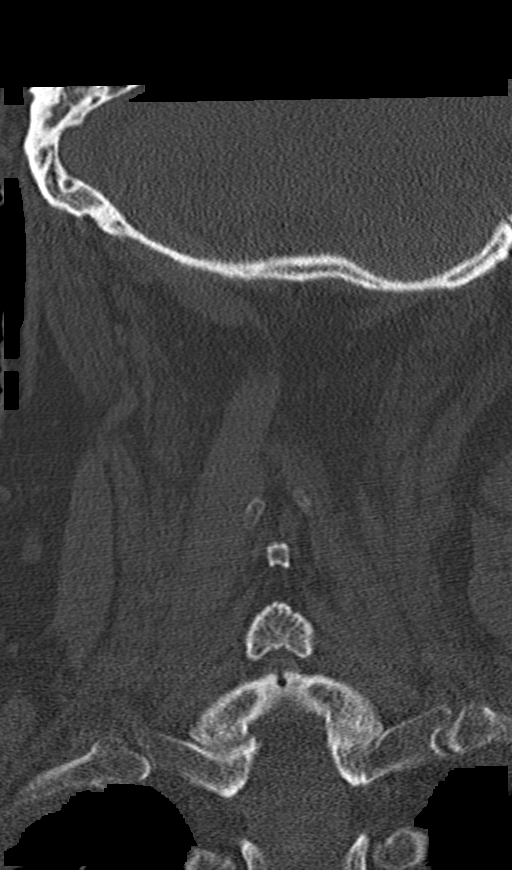

[Series 8: orthogonal bone · axial · 0.23mm/px · z∈[-346,-238]mm · 3 of 103 slices shown, 4 images]
[im 30/103  soft-tissue]
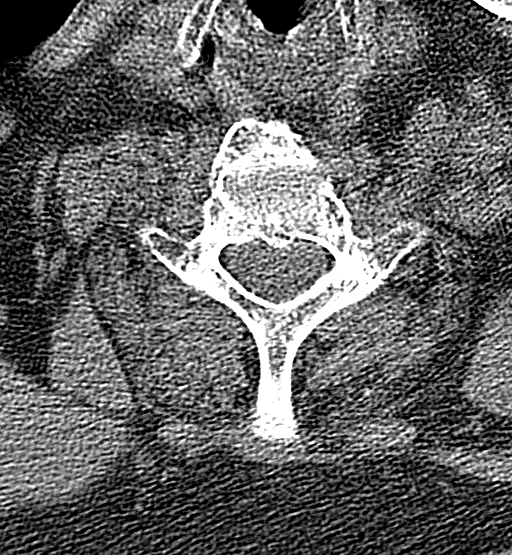
[im 30/103  bone]
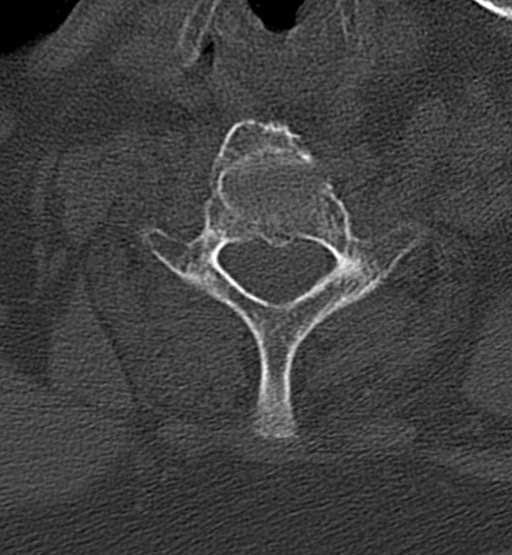
[im 59/103  bone]
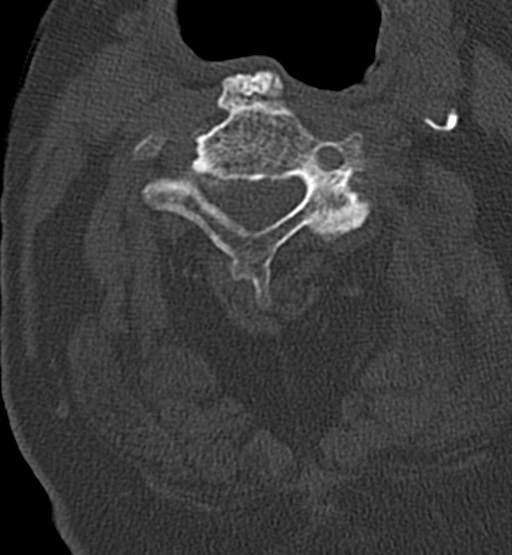
[im 88/103  bone]
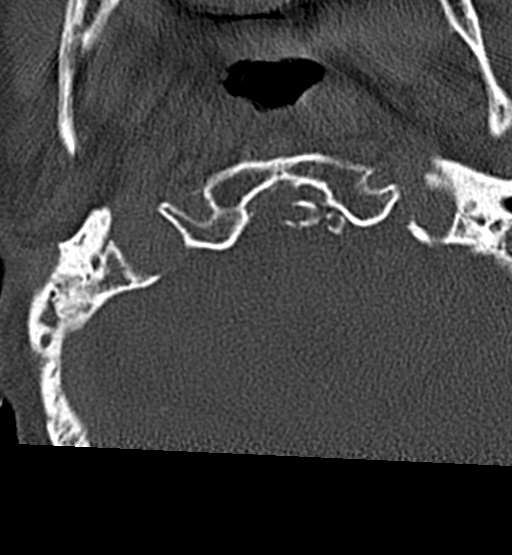

[11 of 33 positions shown; findings below may reference images not displayed]

FINDINGS: CT HEAD FINDINGS

Brain: No acute territorial infarction, hemorrhage, or intracranial
mass. Moderate atrophy. Moderate hypodensity in the white matter
consistent with chronic small vessel ischemic change. Stable
ventricle size.

Vascular: No hyperdense vessels.  Carotid vascular calcification

Skull: Normal. Negative for fracture or focal lesion.

Sinuses/Orbits: No acute finding.

Other: None

CT CERVICAL SPINE FINDINGS

Alignment: Straightening of the cervical spine. No subluxation.
Facet alignment is maintained.

Skull base and vertebrae: No acute fracture. No primary bone lesion
or focal pathologic process.

Soft tissues and spinal canal: No prevertebral fluid or swelling. No
visible canal hematoma.

Disc levels: Diffuse bulky osteophytosis throughout the cervical
spine. Moderate disc space narrowing C3-C4 with mild diffuse disc
space narrowing C4 through C7. Facet degenerative changes at
multiple levels. Moderate canal stenosis at C3-C4. Foraminal
stenosis at multiple levels.

Upper chest: Negative.

Other: None
IMPRESSION: 1. No CT evidence for acute intracranial abnormality. Atrophy and
chronic small vessel ischemic changes of the white matter.
2. Straightening of the cervical spine with degenerative changes. No
acute osseous abnormality.

## 2021-07-12 IMAGING — CT CT HEAD W/O CM
3 of 4 series · 14 of 47 positions shown, 16 images · non-contrast
Comparison: CT 01/01/2016

CLINICAL DATA: Head trauma unresponsive bradycardia

EXAM:
CT HEAD WITHOUT CONTRAST
CT CERVICAL SPINE WITHOUT CONTRAST
TECHNIQUE: Multidetector CT imaging of the head and cervical spine was
performed following the standard protocol without intravenous
contrast. Multiplanar CT image reconstructions of the cervical spine
were also generated.

[Series 4: coronal soft tissue · coronal · 0.34mm/px · 3 of 75 slices shown]
[im 27/75  brain]
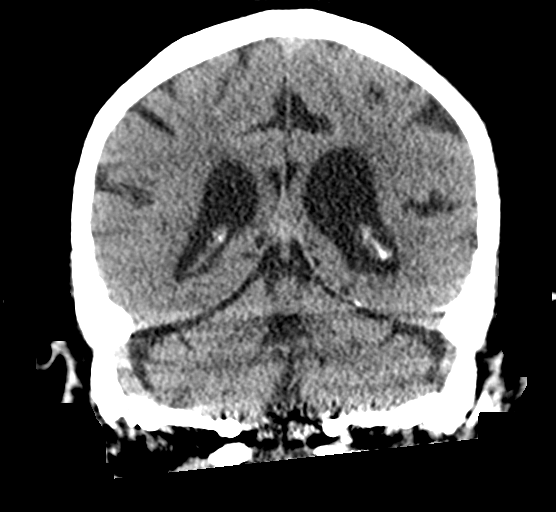
[im 34/75  brain]
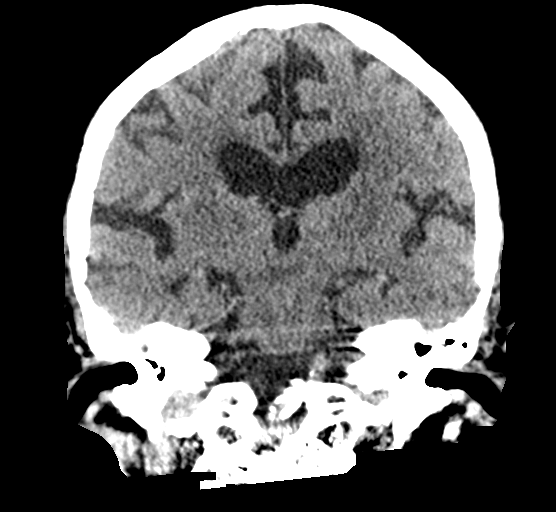
[im 41/75  brain]
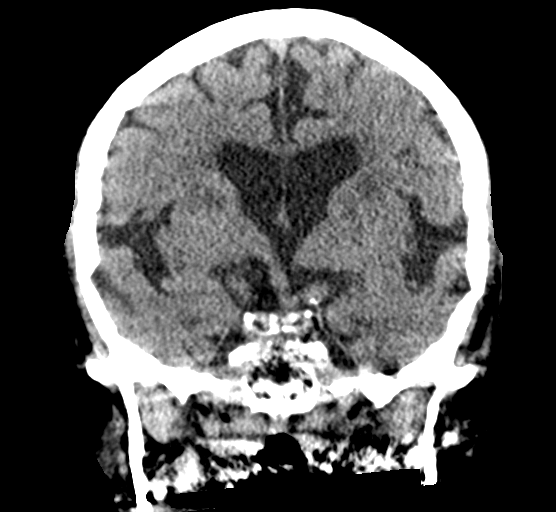

[Series 5: sagittal soft tissue · sagittal · 0.34mm/px · 3 of 61 slices shown]
[im 24/61  brain]
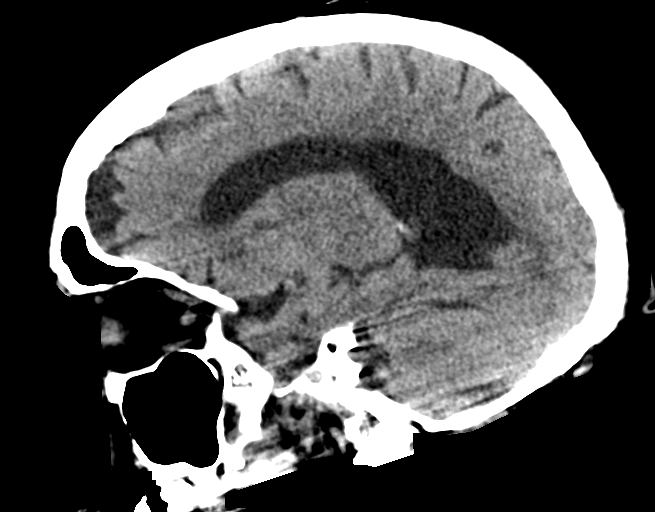
[im 32/61  brain]
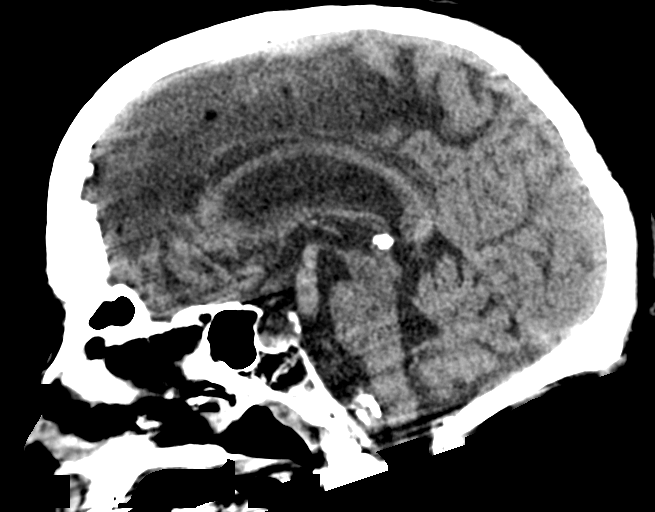
[im 41/61  brain]
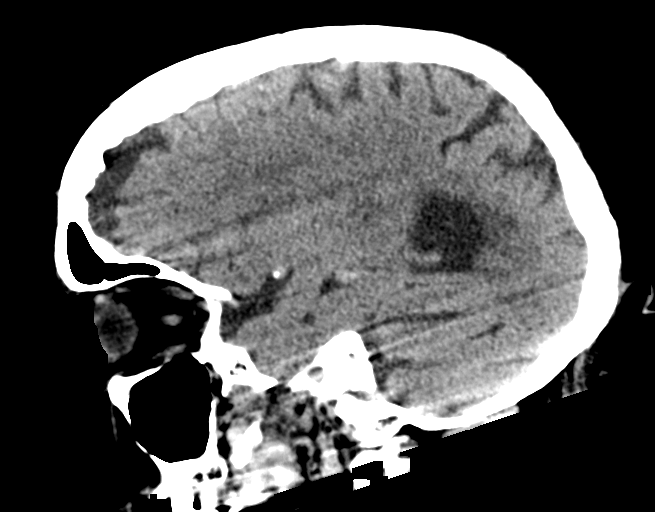

[Series 6: axial soft tissue · axial · 0.37mm/px · z∈[-174,-53]mm · 8 of 59 slices shown, 10 images]
[im 7/59  brain]
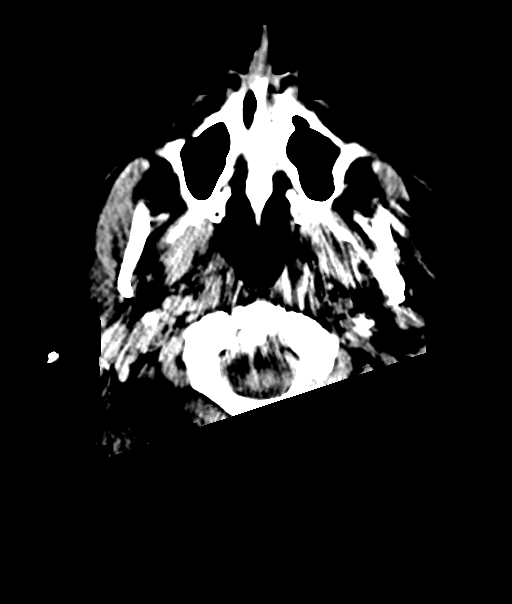
[im 7/59  bone]
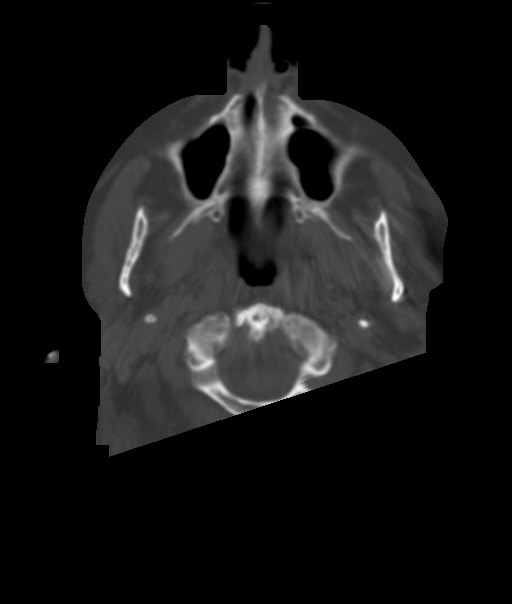
[im 13/59  brain]
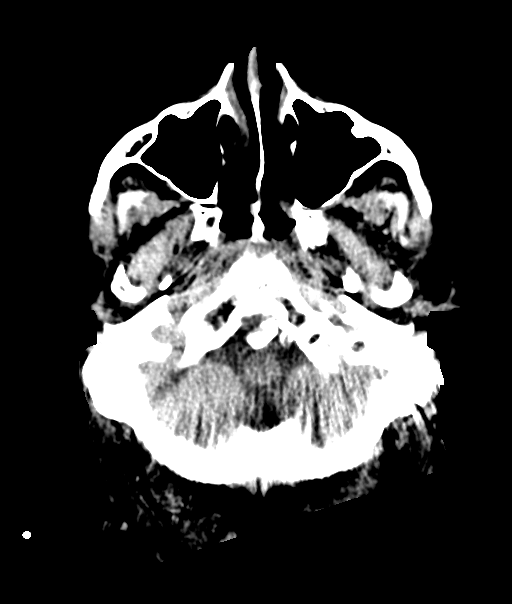
[im 20/59  brain]
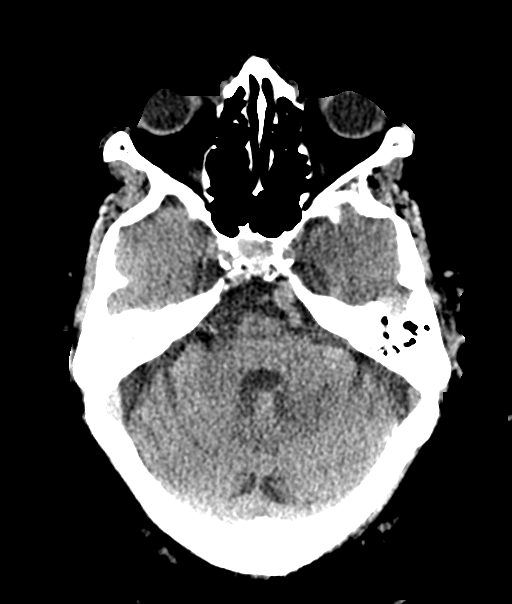
[im 26/59  brain]
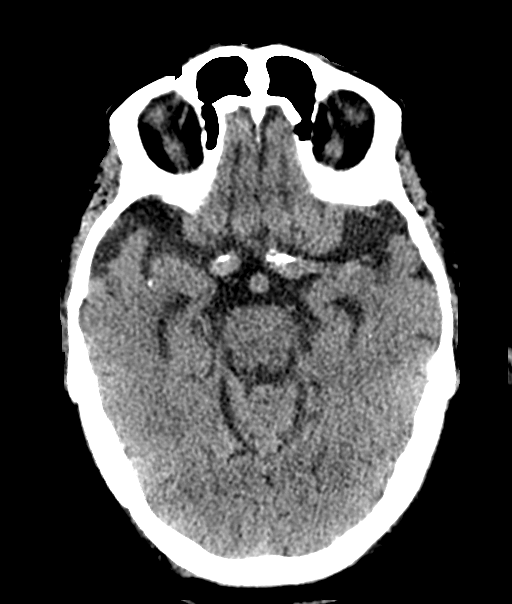
[im 33/59  brain]
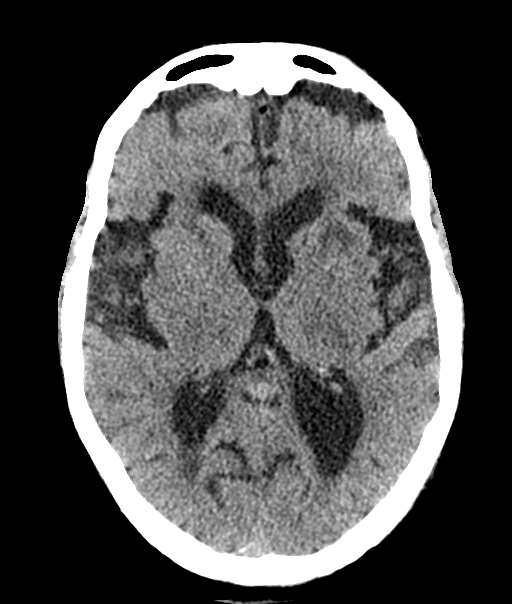
[im 33/59  bone]
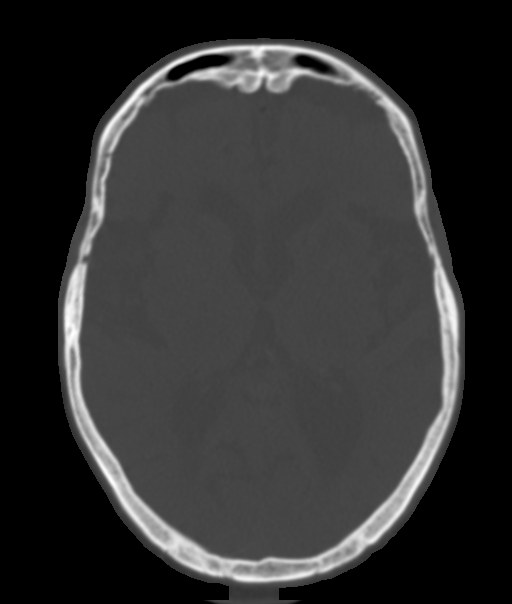
[im 39/59  brain]
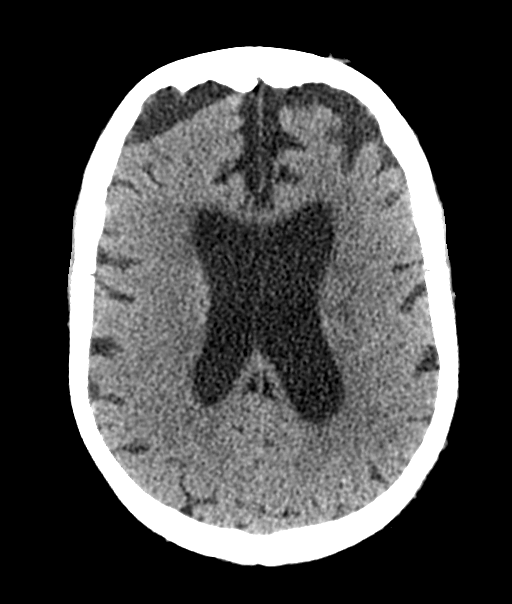
[im 46/59  brain]
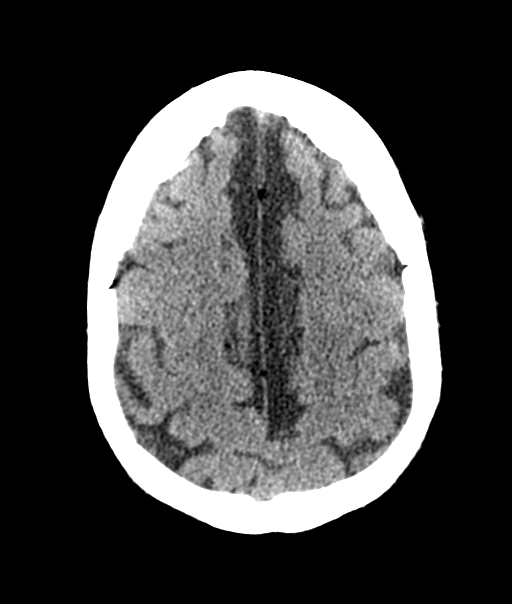
[im 52/59  brain]
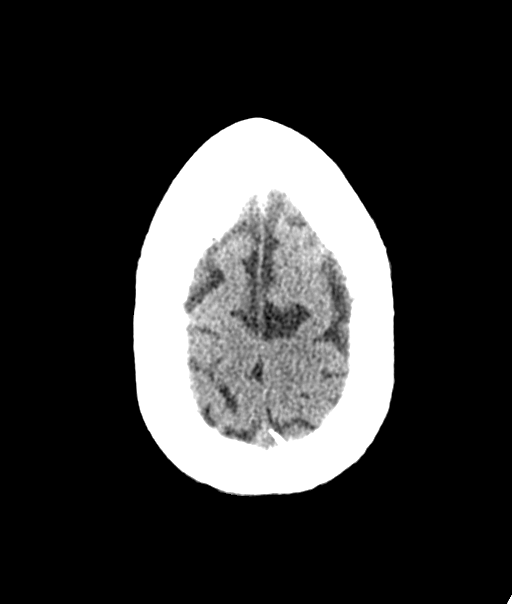

[14 of 47 positions shown; findings below may reference images not displayed]

FINDINGS: CT HEAD FINDINGS

Brain: No acute territorial infarction, hemorrhage, or intracranial
mass. Moderate atrophy. Moderate hypodensity in the white matter
consistent with chronic small vessel ischemic change. Stable
ventricle size.

Vascular: No hyperdense vessels.  Carotid vascular calcification

Skull: Normal. Negative for fracture or focal lesion.

Sinuses/Orbits: No acute finding.

Other: None

CT CERVICAL SPINE FINDINGS

Alignment: Straightening of the cervical spine. No subluxation.
Facet alignment is maintained.

Skull base and vertebrae: No acute fracture. No primary bone lesion
or focal pathologic process.

Soft tissues and spinal canal: No prevertebral fluid or swelling. No
visible canal hematoma.

Disc levels: Diffuse bulky osteophytosis throughout the cervical
spine. Moderate disc space narrowing C3-C4 with mild diffuse disc
space narrowing C4 through C7. Facet degenerative changes at
multiple levels. Moderate canal stenosis at C3-C4. Foraminal
stenosis at multiple levels.

Upper chest: Negative.

Other: None
IMPRESSION: 1. No CT evidence for acute intracranial abnormality. Atrophy and
chronic small vessel ischemic changes of the white matter.
2. Straightening of the cervical spine with degenerative changes. No
acute osseous abnormality.

## 2021-07-13 IMAGING — US US EXTREM LOW VENOUS
1 series · 13 of 24 positions shown · non-contrast
Comparison: None.

CLINICAL DATA: Elevated D-dimer



[Series 1: us venous img lower bilat (dvt) · portal-venous · 13 of 71 slices shown]
[im 1/71]
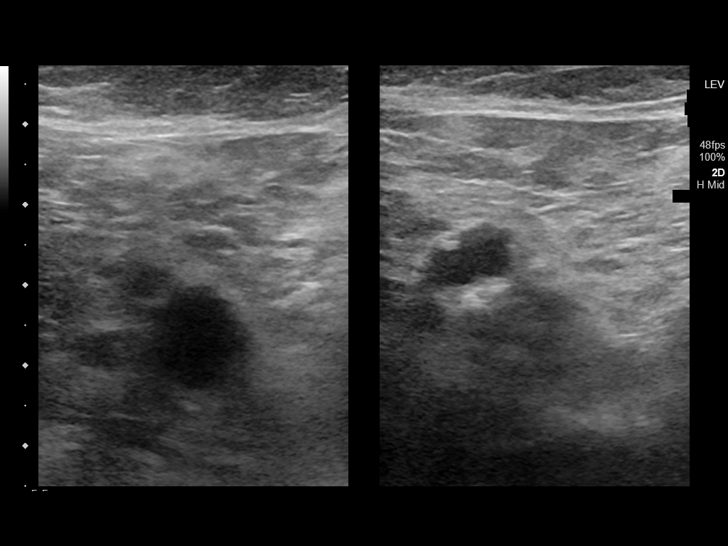
[im 7/71]
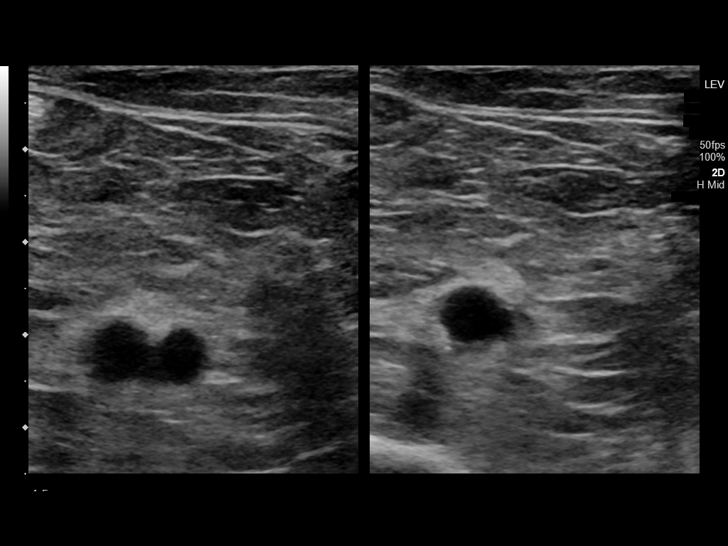
[im 13/71]
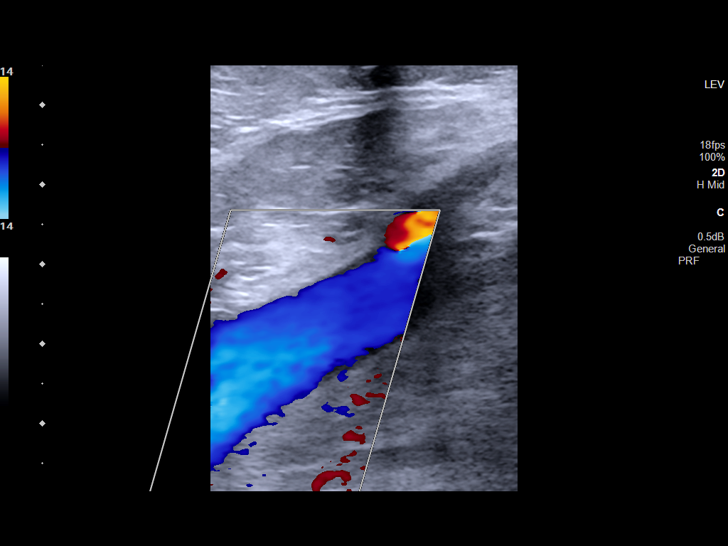
[im 19/71]
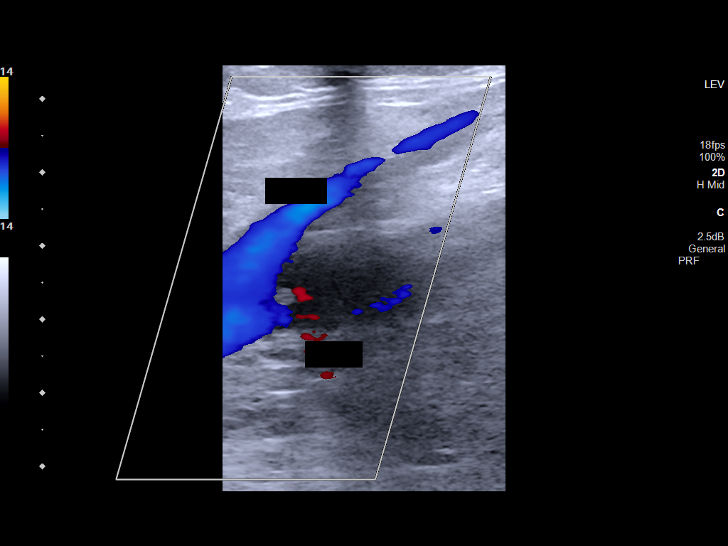
[im 25/71]
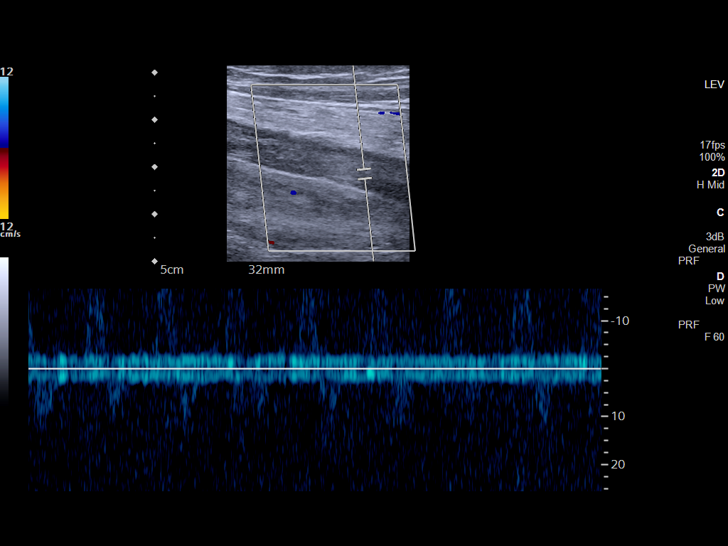
[im 31/71]
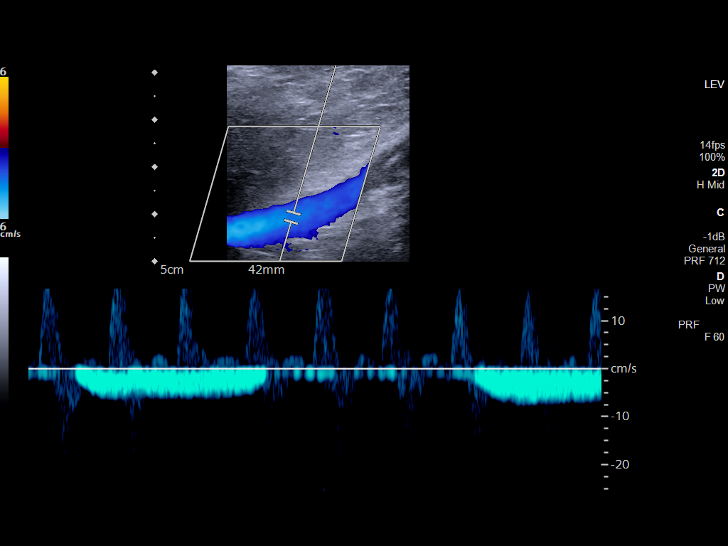
[im 37/71]
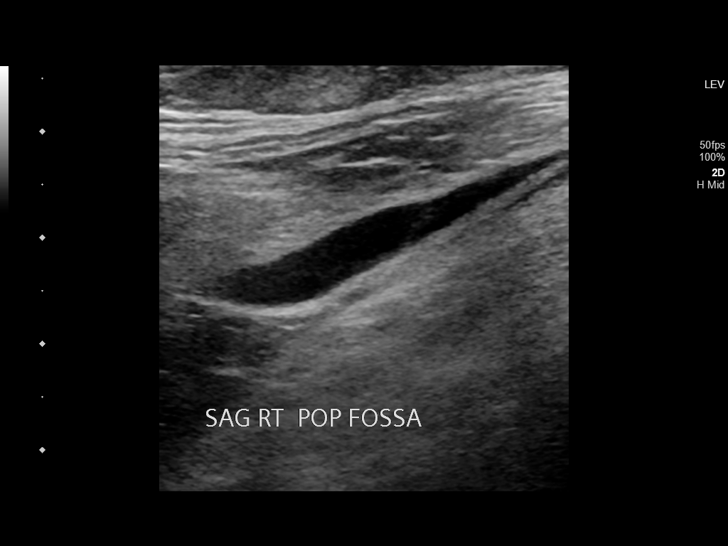
[im 40/71]
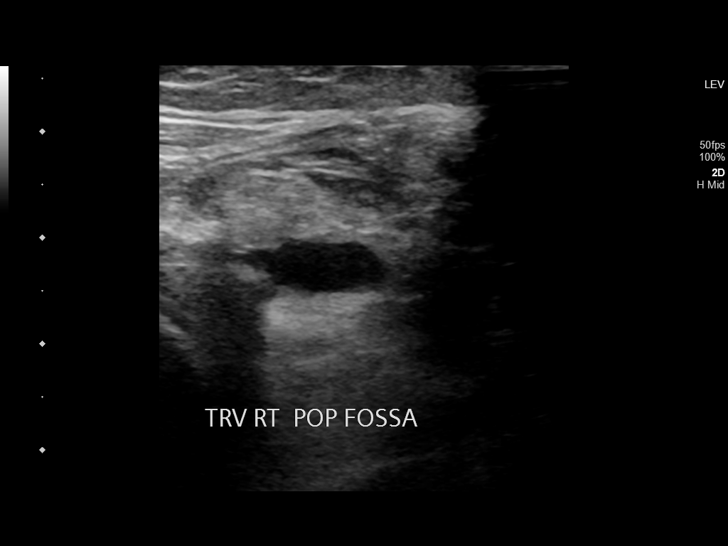
[im 46/71]
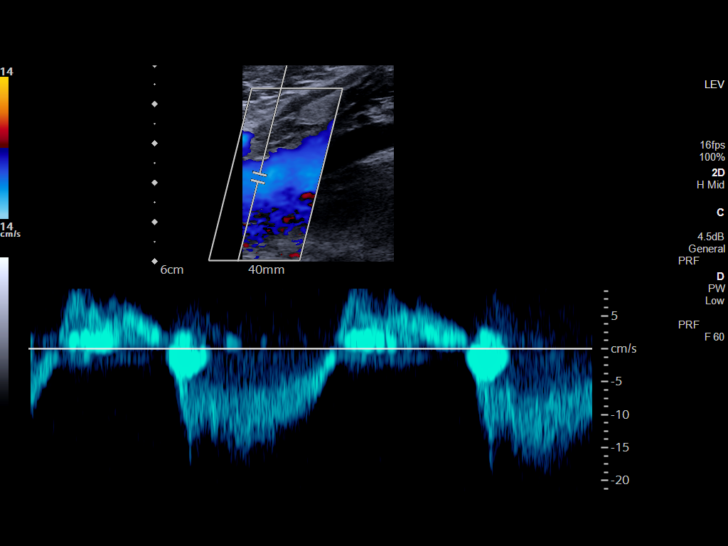
[im 52/71]
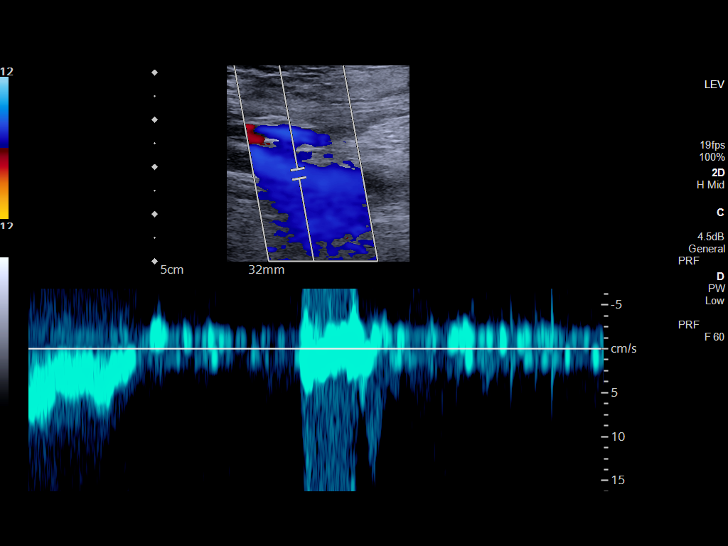
[im 58/71]
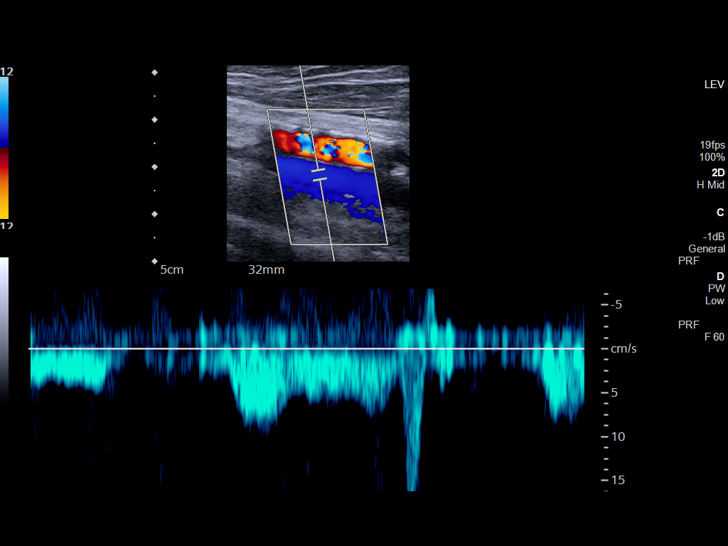
[im 64/71]
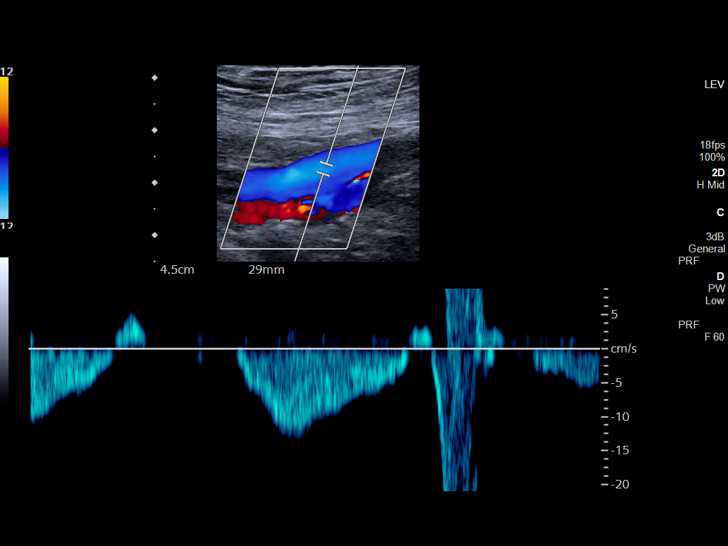
[im 71/71]
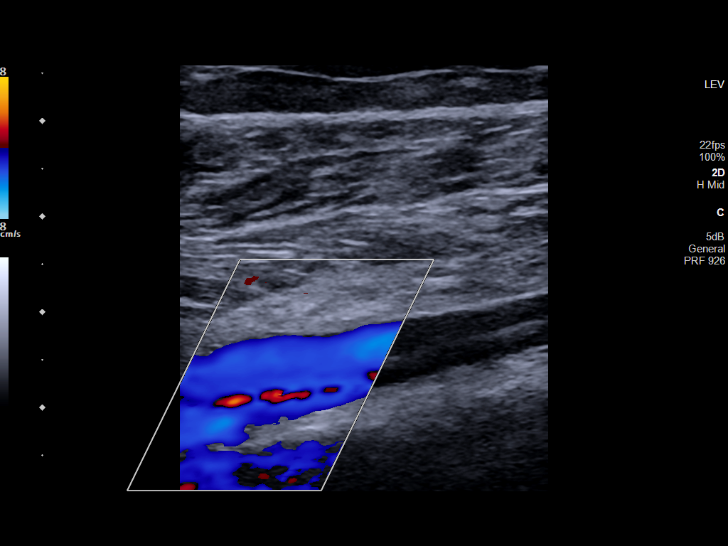

[13 of 24 positions shown; findings below may reference images not displayed]

FINDINGS: RIGHT LOWER EXTREMITY

Common Femoral Vein: Noncompressible occlusive thrombosis

Saphenofemoral Junction: No evidence of thrombus. Normal
compressibility and flow on color Doppler imaging.

Profunda Femoral Vein: No evidence of thrombus. Normal
compressibility and flow on color Doppler imaging.

Femoral Vein: Occlusive thrombus, noncompressible

Popliteal Vein: Noncompressible occlusive thrombus

Calf Veins: There is occlusive thrombus within the posterior tibial
and peroneal veins.

Superficial Great Saphenous Vein: No evidence of thrombus. Normal
compressibility.

Venous Reflux:  None.

Other Findings: Fluid collection in the right popliteal fossa
measures 3.7 x 1.5 cm

LEFT LOWER EXTREMITY

Common Femoral Vein: No evidence of thrombus. Normal
compressibility, respiratory phasicity and response to augmentation.

Saphenofemoral Junction: No evidence of thrombus. Normal
compressibility and flow on color Doppler imaging.

Profunda Femoral Vein: No evidence of thrombus. Normal
compressibility and flow on color Doppler imaging.

Femoral Vein: No evidence of thrombus. Normal compressibility,
respiratory phasicity and response to augmentation.

Popliteal Vein: No evidence of thrombus. Normal compressibility,
respiratory phasicity and response to augmentation.

Calf Veins: No evidence of thrombus. Normal compressibility and flow
on color Doppler imaging.

Superficial Great Saphenous Vein: No evidence of thrombus. Normal
compressibility.

Venous Reflux:  None.

Other Findings:  None.
IMPRESSION: 1. Occlusive thrombus within the right common femoral, femoral, and
popliteal veins. Occlusive thrombus within the right posterior
tibial and peroneal veins.
2. No deep venous thrombosis on the left.
3. Right Baker's cyst measuring up to 3.7 cm.

Critical Value/emergent results were called by telephone at the time
of interpretation on 04/01/2020 at [DATE] to provider MESFIN PETERSON
, who verbally acknowledged these results.
